# Patient Record
Sex: Female | Born: 1955 | Race: White | Hispanic: No | Smoking: Current some day smoker
Health system: Southern US, Community
[De-identification: ages and names within clinical notes are randomized; demographics above are authoritative.]

## PROBLEM LIST (undated history)

## (undated) DIAGNOSIS — K7030 Alcoholic cirrhosis of liver without ascites: Secondary | ICD-10-CM

## (undated) DIAGNOSIS — K7460 Unspecified cirrhosis of liver: Secondary | ICD-10-CM

## (undated) DIAGNOSIS — I272 Pulmonary hypertension, unspecified: Secondary | ICD-10-CM

## (undated) DIAGNOSIS — R0602 Shortness of breath: Secondary | ICD-10-CM

## (undated) DIAGNOSIS — I5189 Other ill-defined heart diseases: Secondary | ICD-10-CM

## (undated) DIAGNOSIS — R7881 Bacteremia: Secondary | ICD-10-CM

## (undated) DIAGNOSIS — I509 Heart failure, unspecified: Secondary | ICD-10-CM

## (undated) DIAGNOSIS — J189 Pneumonia, unspecified organism: Secondary | ICD-10-CM

## (undated) HISTORY — DX: Alcoholic cirrhosis of liver without ascites: K70.30

## (undated) HISTORY — PX: TONSILECTOMY, ADENOIDECTOMY, BILATERAL MYRINGOTOMY AND TUBES: SHX2538

## (undated) HISTORY — DX: Unspecified cirrhosis of liver: K74.60

## (undated) HISTORY — PX: TUBAL LIGATION: SHX77

---

## 1998-09-22 ENCOUNTER — Encounter: Payer: Self-pay | Admitting: Emergency Medicine

## 1998-09-22 ENCOUNTER — Emergency Department (HOSPITAL_COMMUNITY): Admission: EM | Admit: 1998-09-22 | Discharge: 1998-09-22 | Payer: Self-pay | Admitting: Emergency Medicine

## 2006-04-15 ENCOUNTER — Emergency Department (HOSPITAL_COMMUNITY): Admission: EM | Admit: 2006-04-15 | Discharge: 2006-04-15 | Payer: Self-pay | Admitting: Emergency Medicine

## 2007-04-12 ENCOUNTER — Emergency Department (HOSPITAL_COMMUNITY): Admission: EM | Admit: 2007-04-12 | Discharge: 2007-04-12 | Payer: Self-pay | Admitting: Emergency Medicine

## 2008-05-25 HISTORY — PX: PARACENTESIS: SHX844

## 2008-07-06 HISTORY — PX: PARACENTESIS: SHX844

## 2009-05-24 LAB — CBC AND DIFFERENTIAL: HCT: 48 % — AB (ref 36–46)

## 2009-05-24 LAB — HEPATIC FUNCTION PANEL: ALT: 30 U/L (ref 7–35)

## 2010-04-23 ENCOUNTER — Ambulatory Visit (HOSPITAL_COMMUNITY): Admission: RE | Admit: 2010-04-23 | Payer: Self-pay | Source: Home / Self Care | Admitting: Family Medicine

## 2010-05-04 ENCOUNTER — Other Ambulatory Visit (HOSPITAL_COMMUNITY): Payer: Self-pay | Admitting: *Deleted

## 2010-05-04 DIAGNOSIS — Z1239 Encounter for other screening for malignant neoplasm of breast: Secondary | ICD-10-CM

## 2010-05-28 ENCOUNTER — Ambulatory Visit (HOSPITAL_COMMUNITY)
Admission: RE | Admit: 2010-05-28 | Discharge: 2010-05-28 | Disposition: A | Discharge status: Home / Self Care | Payer: PRIVATE HEALTH INSURANCE | Source: Ambulatory Visit | Attending: *Deleted | Admitting: *Deleted

## 2010-05-28 DIAGNOSIS — Z1239 Encounter for other screening for malignant neoplasm of breast: Secondary | ICD-10-CM

## 2010-05-29 ENCOUNTER — Other Ambulatory Visit (HOSPITAL_COMMUNITY): Payer: Self-pay | Admitting: *Deleted

## 2010-05-29 DIAGNOSIS — Z139 Encounter for screening, unspecified: Secondary | ICD-10-CM

## 2010-06-04 ENCOUNTER — Ambulatory Visit (HOSPITAL_COMMUNITY): Payer: PRIVATE HEALTH INSURANCE

## 2010-11-22 ENCOUNTER — Encounter (INDEPENDENT_AMBULATORY_CARE_PROVIDER_SITE_OTHER): Payer: Self-pay

## 2010-12-03 ENCOUNTER — Telehealth (INDEPENDENT_AMBULATORY_CARE_PROVIDER_SITE_OTHER): Payer: Self-pay | Admitting: *Deleted

## 2010-12-03 DIAGNOSIS — K7030 Alcoholic cirrhosis of liver without ascites: Secondary | ICD-10-CM

## 2010-12-03 MED ORDER — POTASSIUM CHLORIDE 10 MEQ PO TBCR: 10.0000 meq | EXTENDED_RELEASE_TABLET | Freq: Every day | ORAL | Status: DC

## 2010-12-03 NOTE — Telephone Encounter (Signed)
Fax request rec'd for a refill . Last filled on 11-21-2010

## 2010-12-03 NOTE — Telephone Encounter (Signed)
Addended by: Len Blalock on: 12/03/2010 02:16 PM   Modules accepted: Orders

## 2011-01-07 ENCOUNTER — Ambulatory Visit (INDEPENDENT_AMBULATORY_CARE_PROVIDER_SITE_OTHER): Payer: PRIVATE HEALTH INSURANCE | Admitting: Internal Medicine

## 2011-02-09 ENCOUNTER — Other Ambulatory Visit (INDEPENDENT_AMBULATORY_CARE_PROVIDER_SITE_OTHER): Payer: Self-pay | Admitting: Internal Medicine

## 2011-02-09 DIAGNOSIS — K7460 Unspecified cirrhosis of liver: Secondary | ICD-10-CM

## 2011-02-11 NOTE — Telephone Encounter (Signed)
Patient needs OV.

## 2011-02-26 ENCOUNTER — Ambulatory Visit (INDEPENDENT_AMBULATORY_CARE_PROVIDER_SITE_OTHER): Payer: PRIVATE HEALTH INSURANCE | Admitting: Internal Medicine

## 2011-03-05 ENCOUNTER — Encounter (INDEPENDENT_AMBULATORY_CARE_PROVIDER_SITE_OTHER): Payer: Self-pay | Admitting: Internal Medicine

## 2011-03-05 ENCOUNTER — Ambulatory Visit (INDEPENDENT_AMBULATORY_CARE_PROVIDER_SITE_OTHER): Payer: Self-pay | Admitting: Internal Medicine

## 2011-03-05 DIAGNOSIS — K7460 Unspecified cirrhosis of liver: Secondary | ICD-10-CM

## 2011-03-05 DIAGNOSIS — K7030 Alcoholic cirrhosis of liver without ascites: Secondary | ICD-10-CM | POA: Insufficient documentation

## 2011-03-05 NOTE — Progress Notes (Signed)
Subjective:     Patient ID: Marissa Figueroa, female   DOB: Oct 28, 1955, 55 y.o.   MRN: [id number]  HPI Lachell is a 55 yr old female here today for scheduled visit for alcoholic cirrhosis. She says she is doing good.  She has gained about 44 pounds since her last visit in February of 2011. Appetite is not good. She does snack frequently.  She has not drank in 1 1/2 months.  She drank for about 3 months. She was drinking about 4-5 drinks a week. She says she is depressed and can't sleep. No lower extremitiy edema. She does tell me she has abdominal distention.   05/24/09 PT 14.2 and 1.1, Glucose 177, BUN 13, Creatinine 0.91, Calcium 9.4, Albumin 3.4, ALP 126, AST 38, Total bili 0.8, ALT 30, Sodium 137, K 3.9, Chloride 98. H and h 16.6 and 48.3 MCV 93.4 07/06/2008 Large volume abdominal paracentesis.  6.4 liters of fluid removed. 05/25/2008 large volume abdominal paracentesis: Removal of 6 liters of fluid  Review of Systems Current Outpatient Prescriptions  Medication Sig Dispense Refill  . b complex vitamins capsule Take 1 capsule by mouth daily.        . furosemide (LASIX) 20 MG tablet TAKE ONE TABLET BY MOUTH TWICE DAILY  60 tablet  0  . lactulose (CHRONULAC) 10 GM/15ML solution Take 20 g by mouth 3 (three) times daily.        . potassium chloride (KLOR-CON) 10 MEQ CR tablet Take 10 mEq by mouth daily.        [name] Kitchen spironolactone (ALDACTONE) 50 MG tablet Take 50 mg by mouth daily.        . potassium chloride (KLOR-CON 10) 10 MEQ CR tablet Take 1 tablet (10 mEq total) by mouth daily.  30 tablet  2   History   Social History  . Marital Status: Married    Spouse Name: N/A    Number of Children: N/A  . Years of Education: N/A   Occupational History  . Not on file.   Social History Main Topics  . Smoking status: Current Everyday Smoker  . Smokeless tobacco: Not on file   Comment: down to 3 cigarettes a day  . Alcohol Use: Yes     no etoh in a month in a half.  She drank mixed drinks.  .  Drug Use: No  . Sexually Active: Not on file   Other Topics Concern  . Not on file   Social History Narrative  . No narrative on file   Family Status  Relation Status Death Age  . Mother Deceased     CAD  . Father Deceased     Small cell cancer  . Sister Alive     good health  . Brother Alive     HTN, Diabetes   Past Medical History  Diagnosis Date  . Cirrhosis     ALCOHOLIC  . Alcoholic cirrhosis of liver    Past Surgical History  Procedure Date  . Tubal ligation   . Tonsilectomy, adenoidectomy, bilateral myringotomy and tubes   . Paracentesis 07/06/08  . Paracentesis 05/25/2008   Allergies no known allergies     Objective:   Physical Exam Filed Vitals:   03/05/11 1437  BP: 130/68  Pulse: 80  Temp: 98.5 F (36.9 C)  Height: 5\' 8"  (1.727 m)  Weight: 236 lb 9.6 oz (107.321 kg)    Alert and oriented. Skin warm and dry. Oral mucosa is moist. Upper dentures. She does not  wear a lower plate. Sclera anicteric, conjunctivae is pink. Thyroid not enlarged. No cervical lymphadenopathy. Lungs clear. Heart regular rate and rhythm.  Abdomen is soft. Bowel sounds are positive. No hepatomegaly,.Abdomen obese. Soft. No abdominal masses felt. No tenderness.  No edema to lower extremities. Patient is alert and oriented.     Assessment:     Alcoholic cirrhosis.  She seems to be doing fairly well. She has had a significant weight gain. Would like to rule out ascites.    Plan:    C-met, AFP, PT/INR, ammonia,  US abdomen. Advised to take medications as prescribed and not skip doses. Further recommendations once we have labs and [place] back.  OV in 6 months.

## 2011-03-05 NOTE — Patient Instructions (Signed)
Follow up in 6 month. Try to quit smoking. Follow up with mental heatlh concerning depression.

## 2011-03-11 ENCOUNTER — Other Ambulatory Visit (HOSPITAL_COMMUNITY): Payer: Self-pay

## 2011-03-12 ENCOUNTER — Ambulatory Visit (HOSPITAL_COMMUNITY)
Admission: RE | Admit: 2011-03-12 | Discharge: 2011-03-12 | Disposition: A | Discharge status: Home / Self Care | Payer: Self-pay | Source: Ambulatory Visit | Attending: Internal Medicine | Admitting: Internal Medicine

## 2011-03-12 DIAGNOSIS — K7460 Unspecified cirrhosis of liver: Secondary | ICD-10-CM | POA: Insufficient documentation

## 2011-03-12 DIAGNOSIS — K7030 Alcoholic cirrhosis of liver without ascites: Secondary | ICD-10-CM

## 2011-03-12 DIAGNOSIS — R188 Other ascites: Secondary | ICD-10-CM | POA: Insufficient documentation

## 2011-03-12 LAB — COMPREHENSIVE METABOLIC PANEL
ALT: 65 U/L — ABNORMAL HIGH (ref 0–35)
CO2: 29 mEq/L (ref 19–32)
Calcium: 9 mg/dL (ref 8.4–10.5)
Chloride: 100 mEq/L (ref 96–112)
Glucose, Bld: 120 mg/dL — ABNORMAL HIGH (ref 70–99)
Potassium: 3.5 mEq/L (ref 3.5–5.3)
Sodium: 136 mEq/L (ref 135–145)
Total Protein: 6.8 g/dL (ref 6.0–8.3)

## 2011-03-12 LAB — PROTIME-INR: Prothrombin Time: 16.4 seconds — ABNORMAL HIGH (ref 11.6–15.2)

## 2011-03-12 LAB — CBC WITH DIFFERENTIAL/PLATELET
Eosinophils Absolute: 0.2 10*3/uL (ref 0.0–0.7)
Hemoglobin: 14.6 g/dL (ref 12.0–15.0)
Lymphs Abs: 1.9 10*3/uL (ref 0.7–4.0)
MCH: 33.1 pg (ref 26.0–34.0)
MCV: 100.5 fL — ABNORMAL HIGH (ref 78.0–100.0)
Monocytes Absolute: 0.4 10*3/uL (ref 0.1–1.0)
WBC: 6.2 10*3/uL (ref 4.0–10.5)

## 2011-03-12 LAB — AMMONIA: Ammonia: 60 umol/L (ref 11–60)

## 2011-03-13 ENCOUNTER — Telehealth (INDEPENDENT_AMBULATORY_CARE_PROVIDER_SITE_OTHER): Payer: Self-pay | Admitting: *Deleted

## 2011-03-13 DIAGNOSIS — K7460 Unspecified cirrhosis of liver: Secondary | ICD-10-CM

## 2011-03-13 LAB — AFP TUMOR MARKER: AFP-Tumor Marker: 6.2 ng/mL (ref 0.0–8.0)

## 2011-03-13 NOTE — Telephone Encounter (Signed)
Per Delrae Rend the patient will need c-met in 6 months.

## 2011-04-22 ENCOUNTER — Other Ambulatory Visit (INDEPENDENT_AMBULATORY_CARE_PROVIDER_SITE_OTHER): Payer: Self-pay | Admitting: Internal Medicine

## 2011-04-23 ENCOUNTER — Telehealth (INDEPENDENT_AMBULATORY_CARE_PROVIDER_SITE_OTHER): Payer: Self-pay | Admitting: *Deleted

## 2011-04-23 NOTE — Telephone Encounter (Signed)
LM asking for Terri to please return her call. The return phone number is [phone].

## 2011-04-23 NOTE — Telephone Encounter (Signed)
Patient advised to stop the Potassium. She tells me she has not drank in several months and to tell Dr. Karilyn Cota she is sorry.

## 2011-04-23 NOTE — Telephone Encounter (Signed)
This has already been addressed

## 2011-04-23 NOTE — Telephone Encounter (Signed)
I have already spoken to patient.

## 2011-05-09 ENCOUNTER — Other Ambulatory Visit (INDEPENDENT_AMBULATORY_CARE_PROVIDER_SITE_OTHER): Payer: Self-pay | Admitting: Internal Medicine

## 2011-05-09 DIAGNOSIS — K7460 Unspecified cirrhosis of liver: Secondary | ICD-10-CM

## 2011-08-15 ENCOUNTER — Encounter (INDEPENDENT_AMBULATORY_CARE_PROVIDER_SITE_OTHER): Payer: Self-pay | Admitting: *Deleted

## 2011-08-19 ENCOUNTER — Encounter (INDEPENDENT_AMBULATORY_CARE_PROVIDER_SITE_OTHER): Payer: Self-pay | Admitting: *Deleted

## 2011-08-19 ENCOUNTER — Other Ambulatory Visit (INDEPENDENT_AMBULATORY_CARE_PROVIDER_SITE_OTHER): Payer: Self-pay | Admitting: *Deleted

## 2011-08-19 DIAGNOSIS — K7460 Unspecified cirrhosis of liver: Secondary | ICD-10-CM

## 2011-09-02 ENCOUNTER — Ambulatory Visit (INDEPENDENT_AMBULATORY_CARE_PROVIDER_SITE_OTHER): Payer: Self-pay | Admitting: Internal Medicine

## 2011-09-09 ENCOUNTER — Other Ambulatory Visit (INDEPENDENT_AMBULATORY_CARE_PROVIDER_SITE_OTHER): Payer: Self-pay | Admitting: Internal Medicine

## 2011-10-01 ENCOUNTER — Ambulatory Visit (INDEPENDENT_AMBULATORY_CARE_PROVIDER_SITE_OTHER): Payer: Self-pay | Admitting: Internal Medicine

## 2011-10-02 ENCOUNTER — Ambulatory Visit (INDEPENDENT_AMBULATORY_CARE_PROVIDER_SITE_OTHER): Payer: Self-pay | Admitting: Internal Medicine

## 2011-10-03 ENCOUNTER — Encounter (INDEPENDENT_AMBULATORY_CARE_PROVIDER_SITE_OTHER): Payer: Self-pay | Admitting: Internal Medicine

## 2011-10-03 ENCOUNTER — Ambulatory Visit (INDEPENDENT_AMBULATORY_CARE_PROVIDER_SITE_OTHER): Payer: Self-pay | Admitting: Internal Medicine

## 2011-10-03 ENCOUNTER — Telehealth (INDEPENDENT_AMBULATORY_CARE_PROVIDER_SITE_OTHER): Payer: Self-pay | Admitting: *Deleted

## 2011-10-03 VITALS — BP 118/58 | HR 80 | Temp 98.2°F | Ht 68.0 in | Wt 224.0 lb

## 2011-10-03 DIAGNOSIS — K7030 Alcoholic cirrhosis of liver without ascites: Secondary | ICD-10-CM

## 2011-10-03 NOTE — Patient Instructions (Signed)
Follow up in 6 months with a PT/INR and a Cmet,

## 2011-10-03 NOTE — Telephone Encounter (Signed)
Per Delrae Rend the patient will need to have Pt/INR , and C-Met in 6 months. The patient has an appointment 03/31/12, labs should be done 03/30/12.

## 2011-10-03 NOTE — Progress Notes (Signed)
Subjective:     Patient ID: Marissa Figueroa, female   DOB: 05/02/1955, 56 y.o.   MRN: [id number]  HPI Odell is here for a scheduled visit. She was last seen in November. She has a hx of alcoholic cirrhosis.  She tells me she has not drank anything since  November. Appetite is okay. She says she eats. Her last weight in November was 238. Today her weight is 224. She underwent an [place] in November for possible ascites and was negative for ascites. She is having a BM about one a day.     07/06/2008 Large volume abdominal paracentesis. 6.4 liters of fluid removed.  05/25/2008 large volume abdominal paracentesis: Removal of 6 liters of fluid    02/2011 US abdomen: IMPRESSION:  Normal gallbladder. Negative for gallstones or cholecystitis.  Prominent common bile duct measuring 7 mm in diameter but no  intrahepatic biliary dilatation.  Ultrasound evidence of hepatic cirrhosis and portal hypertension  with portal venous flow hepatofugal by color Doppler.  02/2011 AFP 6.2 CBC    Component Value Date/Time   WBC 6.2 03/05/2011 1453   RBC 4.41 03/05/2011 1453   HGB 14.6 03/05/2011 1453   HCT 44.3 03/05/2011 1453   PLT 80* 03/05/2011 1453   MCV 100.5* 03/05/2011 1453   MCH 33.1 03/05/2011 1453   MCHC 33.0 03/05/2011 1453   RDW 15.7* 03/05/2011 1453   LYMPHSABS 1.9 03/05/2011 1453   MONOABS 0.4 03/05/2011 1453   EOSABS 0.2 03/05/2011 1453   BASOSABS 0.0 03/05/2011 1453    CMP     Component Value Date/Time   NA 136 03/05/2011 1453   NA 137 05/24/2009   K 3.5 03/05/2011 1453   CL 100 03/05/2011 1453   CO2 29 03/05/2011 1453   GLUCOSE 120* 03/05/2011 1453   BUN 10 03/05/2011 1453   CREATININE 0.89 03/05/2011 1453   CALCIUM 9.0 03/05/2011 1453   PROT 6.8 03/05/2011 1453   ALBUMIN 2.8* 03/05/2011 1453   AST 168* 03/05/2011 1453   ALT 65* 03/05/2011 1453   ALKPHOS 144* 03/05/2011 1453   BILITOT 2.0* 03/05/2011 1453   Ammonia 60 .  Review of Systems see hpi Current Outpatient  Prescriptions  Medication Sig Dispense Refill  . b complex vitamins capsule Take 1 capsule by mouth daily.        . calcium citrate-vitamin D 200-200 MG-UNIT TABS Take 1 tablet by mouth daily.      . furosemide (LASIX) 20 MG tablet TAKE ONE TABLET BY MOUTH TWICE DAILY  1 tablet  4  . lactulose (CHRONULAC) 10 GM/15ML solution TAKE 15 ML BY MOUTH TWICE DAILY  946 mL  3  . spironolactone (ALDACTONE) 25 MG tablet TAKE TWO TABLETS BY MOUTH TWICE DAILY  120 tablet  3  . potassium chloride (KLOR-CON) 10 MEQ CR tablet Take 10 mEq by mouth daily.        [name] Kitchen spironolactone (ALDACTONE) 50 MG tablet Take 50 mg by mouth daily.        ' Past Medical History  Diagnosis Date  . Cirrhosis     ALCOHOLIC  . Alcoholic cirrhosis of liver    Past Surgical History  Procedure Date  . Tubal ligation   . Tonsilectomy, adenoidectomy, bilateral myringotomy and tubes   . Paracentesis 07/06/08  . Paracentesis 05/25/2008   History   Social History  . Marital Status: Married    Spouse Name: N/A    Number of Children: N/A  . Years of Education: N/A   Occupational  History  . Not on file.   Social History Main Topics  . Smoking status: Current Everyday Smoker  . Smokeless tobacco: Not on file   Comment: down to 3 cigarettes a day  . Alcohol Use: Yes     no etoh in a month in a half.  She drank mixed drinks.  . Drug Use: No  . Sexually Active: Not on file   Other Topics Concern  . Not on file   Social History Narrative  . No narrative on file   Family Status  Relation Status Death Age  . Mother Deceased     CAD  . Father Deceased     Small cell cancer  . Sister Alive     good health  . Brother Alive     HTN, Diabetes   Allergies no known allergies  .-     Objective:There were no vitals filed for this visit.    Physical Exam Filed Vitals:   10/03/11 1544  Height: 5\' 8"  (1.727 m)  Weight: 224 lb (101.606 kg)  Alert and oriented. Skin warm and dry. Oral mucosa is moist.   . Sclera  anicteric, conjunctivae is pink. Thyroid not enlarged. No cervical lymphadenopathy. Lungs clear. Heart regular rate and rhythm.  Abdomen is soft. Obese. Bowel sounds are positive. No hepatomegaly. No abdominal masses felt. No tenderness.  No edema to lower extremities.       Assessment:   Alcoholic cirrhosis. She has not drank in over 7 months. She has had weight gain. Ankles without edema.     Plan:    Cmet today. She did not have lab drawn before coming to office. OV in 6 month with a CMET and PT/INR.

## 2011-10-08 NOTE — Progress Notes (Signed)
Patient is aware that she needs this lab work

## 2011-10-08 NOTE — Progress Notes (Signed)
She will have this lab drawn this week

## 2011-12-05 ENCOUNTER — Encounter (INDEPENDENT_AMBULATORY_CARE_PROVIDER_SITE_OTHER): Payer: Self-pay | Admitting: Internal Medicine

## 2011-12-05 ENCOUNTER — Ambulatory Visit (INDEPENDENT_AMBULATORY_CARE_PROVIDER_SITE_OTHER): Payer: Self-pay | Admitting: Internal Medicine

## 2011-12-05 VITALS — BP 90/60 | HR 72 | Temp 98.4°F | Ht 68.0 in | Wt 214.8 lb

## 2011-12-05 DIAGNOSIS — R17 Unspecified jaundice: Secondary | ICD-10-CM

## 2011-12-05 DIAGNOSIS — K838 Other specified diseases of biliary tract: Secondary | ICD-10-CM

## 2011-12-05 NOTE — Progress Notes (Signed)
Subjective:     Patient ID: Marissa Figueroa, female   DOB: October 02, 1955, 56 y.o.   MRN: [id number]  HPI  Recently seen in the ED with vomiting. She had been vomiting  X 4 days. In the ED, she received 3 liters of fluid with potassium.  Her potassium was 2.8.   She tells me while she was sick, all she could get in her was water. Noted 11/29/2011 Total bil 5.2,  AST 139, ALT 57, ALP 108, Total protein 8.0., Albumin 3.2, Amylase 69, Lipase 39. H and H 16.8 and 48.3, MCV 102.9 Platelet ct  78. Appetite is not better.  Her daughter says her mother is better.  No abdominal distention.  No rectal bleeding.   Daelynn has a hx of alcoholic cirrhosis.  US  Abdomen: 11/29/2011  Enlarged, heterogeneous liver with nodular contours compatible with cirrhosis. Borderline dilated CBD. No visible stones, but the distal duct is difficult to visualize.  Gallbladder also appears mildly distended without wall thickening or visible stones. MRCP may be beneficial.   11/30/2011 Total bili 3.6, direct 1.0, Indirect 2.6 ALT44, ALP 95, AST 109,  K. 3.4   Review of Systems see hpi Current Outpatient Prescriptions  Medication Sig Dispense Refill  . b complex vitamins capsule Take 1 capsule by mouth daily.        . calcium citrate-vitamin D 200-200 MG-UNIT TABS Take 1 tablet by mouth daily.      . furosemide (LASIX) 20 MG tablet TAKE ONE TABLET BY MOUTH TWICE DAILY  1 tablet  4  . lactulose (CHRONULAC) 10 GM/15ML solution TAKE 15 ML BY MOUTH TWICE DAILY  946 mL  3  . potassium chloride (K-DUR) 10 MEQ tablet Take 10 mEq by mouth 2 (two) times daily.      . potassium chloride (KLOR-CON) 10 MEQ CR tablet Take 10 mEq by mouth daily.        [name] Kitchen spironolactone (ALDACTONE) 25 MG tablet TAKE TWO TABLETS BY MOUTH TWICE DAILY  120 tablet  3  . DISCONTD: spironolactone (ALDACTONE) 50 MG tablet Take 50 mg by mouth daily.         Past Medical History  Diagnosis Date  . Cirrhosis     ALCOHOLIC  . Alcoholic cirrhosis of liver     History   Social History  . Marital Status: Married    Spouse Name: N/A    Number of Children: N/A  . Years of Education: N/A   Occupational History  . Not on file.   Social History Main Topics  . Smoking status: Current Everyday Smoker  . Smokeless tobacco: Not on file   Comment: down to 3 cigarettes a day  . Alcohol Use: Yes     drank a mixed 3 weeks ago.   . Drug Use: No  . Sexually Active: Not on file   Other Topics Concern  . Not on file   Social History Narrative  . No narrative on file   Family Status  Relation Status Death Age  . Mother Deceased     CAD  . Father Deceased     Small cell cancer  . Sister Alive     good health  . Brother Alive     HTN, Diabetes   No Known Allergies Current Outpatient Prescriptions on File Prior to Visit  Medication Sig Dispense Refill  . b complex vitamins capsule Take 1 capsule by mouth daily.        . calcium citrate-vitamin D 200-200 MG-UNIT  TABS Take 1 tablet by mouth daily.      . furosemide (LASIX) 20 MG tablet TAKE ONE TABLET BY MOUTH TWICE DAILY  1 tablet  4  . lactulose (CHRONULAC) 10 GM/15ML solution TAKE 15 ML BY MOUTH TWICE DAILY  946 mL  3  . potassium chloride (KLOR-CON) 10 MEQ CR tablet Take 10 mEq by mouth daily.        [name] Kitchen spironolactone (ALDACTONE) 25 MG tablet TAKE TWO TABLETS BY MOUTH TWICE DAILY  120 tablet  3  . DISCONTD: spironolactone (ALDACTONE) 50 MG tablet Take 50 mg by mouth daily.            Objective:   Physical Exam Filed Vitals:   12/05/11 1453  Height: 5\' 8"  (1.727 m)  Weight: 214 lb 12.8 oz (97.433 kg)  Alert and oriented. Skin warm and dry. Oral mucosa is moist.   . Sclera slightly icteric, conjunctivae is pink. Thyroid not enlarged. No cervical lymphadenopathy. Lungs clear. Heart regular rate and rhythm.  Abdomen is soft. Bowel sounds are positive. No hepatomegaly. No abdominal masses felt. No tenderness.  No edema to lower extremities. Patient is alert and oriented.        Assessment:   Alcoholic cirrhosis. Elevated bilirubin with abnormal US abdomen. CBD stone needs to be ruled out.     Plan:    MRCP abdomen. No etoh.

## 2011-12-05 NOTE — Patient Instructions (Addendum)
Will schedule an MRCP abdomen. No drinking.

## 2011-12-11 ENCOUNTER — Other Ambulatory Visit (INDEPENDENT_AMBULATORY_CARE_PROVIDER_SITE_OTHER): Payer: Self-pay | Admitting: Internal Medicine

## 2011-12-11 ENCOUNTER — Other Ambulatory Visit (HOSPITAL_COMMUNITY): Payer: Self-pay

## 2011-12-12 ENCOUNTER — Other Ambulatory Visit (HOSPITAL_COMMUNITY): Payer: Self-pay

## 2011-12-13 ENCOUNTER — Other Ambulatory Visit (INDEPENDENT_AMBULATORY_CARE_PROVIDER_SITE_OTHER): Payer: Self-pay | Admitting: Internal Medicine

## 2011-12-13 ENCOUNTER — Inpatient Hospital Stay (HOSPITAL_COMMUNITY): Admission: RE | Admit: 2011-12-13 | Payer: Self-pay | Source: Ambulatory Visit

## 2011-12-13 DIAGNOSIS — K838 Other specified diseases of biliary tract: Secondary | ICD-10-CM

## 2011-12-13 DIAGNOSIS — R17 Unspecified jaundice: Secondary | ICD-10-CM

## 2011-12-18 ENCOUNTER — Ambulatory Visit (HOSPITAL_COMMUNITY)
Admission: RE | Admit: 2011-12-18 | Discharge: 2011-12-18 | Disposition: A | Discharge status: Home / Self Care | Payer: Self-pay | Source: Ambulatory Visit | Attending: Internal Medicine | Admitting: Internal Medicine

## 2011-12-18 ENCOUNTER — Telehealth (INDEPENDENT_AMBULATORY_CARE_PROVIDER_SITE_OTHER): Payer: Self-pay | Admitting: *Deleted

## 2011-12-18 DIAGNOSIS — R17 Unspecified jaundice: Secondary | ICD-10-CM

## 2011-12-18 DIAGNOSIS — K838 Other specified diseases of biliary tract: Secondary | ICD-10-CM

## 2011-12-18 MED ORDER — LORAZEPAM 0.5 MG PO TABS: 0.5000 mg | ORAL_TABLET | Freq: Once | ORAL | Status: AC

## 2011-12-18 NOTE — Telephone Encounter (Signed)
Will premedicate with Ativan 0.5mg  x 1 dose.

## 2011-12-18 NOTE — Telephone Encounter (Signed)
Patient is shc'd for MRI tomorrow and is claustrophobic and needs something called to [facility] in [place]

## 2011-12-19 ENCOUNTER — Ambulatory Visit (HOSPITAL_COMMUNITY)
Admission: RE | Admit: 2011-12-19 | Discharge: 2011-12-19 | Disposition: A | Discharge status: Home / Self Care | Payer: Self-pay | Source: Ambulatory Visit | Attending: Internal Medicine | Admitting: Internal Medicine

## 2011-12-19 DIAGNOSIS — K766 Portal hypertension: Secondary | ICD-10-CM | POA: Insufficient documentation

## 2011-12-19 DIAGNOSIS — R933 Abnormal findings on diagnostic imaging of other parts of digestive tract: Secondary | ICD-10-CM | POA: Insufficient documentation

## 2011-12-19 DIAGNOSIS — K7460 Unspecified cirrhosis of liver: Secondary | ICD-10-CM | POA: Insufficient documentation

## 2011-12-19 DIAGNOSIS — R932 Abnormal findings on diagnostic imaging of liver and biliary tract: Secondary | ICD-10-CM | POA: Insufficient documentation

## 2011-12-19 MED ORDER — GADOBENATE DIMEGLUMINE 529 MG/ML IV SOLN: 20.0000 mL | Freq: Once | INTRAVENOUS | Status: AC | PRN

## 2012-01-01 ENCOUNTER — Other Ambulatory Visit (INDEPENDENT_AMBULATORY_CARE_PROVIDER_SITE_OTHER): Payer: Self-pay | Admitting: Internal Medicine

## 2012-03-10 ENCOUNTER — Telehealth (INDEPENDENT_AMBULATORY_CARE_PROVIDER_SITE_OTHER): Payer: Self-pay | Admitting: *Deleted

## 2012-03-10 DIAGNOSIS — K7030 Alcoholic cirrhosis of liver without ascites: Secondary | ICD-10-CM

## 2012-03-10 NOTE — Telephone Encounter (Signed)
Lab order printed

## 2012-03-11 ENCOUNTER — Encounter (INDEPENDENT_AMBULATORY_CARE_PROVIDER_SITE_OTHER): Payer: Self-pay | Admitting: *Deleted

## 2012-03-26 ENCOUNTER — Emergency Department (HOSPITAL_COMMUNITY)
Admission: EM | Admit: 2012-03-26 | Discharge: 2012-03-26 | Disposition: A | Discharge status: Home / Self Care | Payer: Self-pay | Attending: Emergency Medicine | Admitting: Emergency Medicine

## 2012-03-26 ENCOUNTER — Encounter (HOSPITAL_COMMUNITY): Payer: Self-pay | Admitting: Emergency Medicine

## 2012-03-26 DIAGNOSIS — F10229 Alcohol dependence with intoxication, unspecified: Secondary | ICD-10-CM | POA: Insufficient documentation

## 2012-03-26 DIAGNOSIS — R55 Syncope and collapse: Secondary | ICD-10-CM | POA: Insufficient documentation

## 2012-03-26 DIAGNOSIS — F10929 Alcohol use, unspecified with intoxication, unspecified: Secondary | ICD-10-CM

## 2012-03-26 DIAGNOSIS — I509 Heart failure, unspecified: Secondary | ICD-10-CM | POA: Insufficient documentation

## 2012-03-26 DIAGNOSIS — Z79899 Other long term (current) drug therapy: Secondary | ICD-10-CM | POA: Insufficient documentation

## 2012-03-26 DIAGNOSIS — F17200 Nicotine dependence, unspecified, uncomplicated: Secondary | ICD-10-CM | POA: Insufficient documentation

## 2012-03-26 DIAGNOSIS — K7030 Alcoholic cirrhosis of liver without ascites: Secondary | ICD-10-CM | POA: Insufficient documentation

## 2012-03-26 HISTORY — DX: Heart failure, unspecified: I50.9

## 2012-03-26 LAB — COMPREHENSIVE METABOLIC PANEL
AST: 144 U/L — ABNORMAL HIGH (ref 0–37)
CO2: 28 mEq/L (ref 19–32)
Calcium: 9 mg/dL (ref 8.4–10.5)
Creatinine, Ser: 0.71 mg/dL (ref 0.50–1.10)
GFR calc Af Amer: >90 mL/min (ref >90)
GFR calc non Af Amer: >90 mL/min (ref >90)
Glucose, Bld: 125 mg/dL — ABNORMAL HIGH (ref 70–99)

## 2012-03-26 LAB — URINALYSIS, ROUTINE W REFLEX MICROSCOPIC
Leukocytes, UA: NEGATIVE
Urobilinogen, UA: 0.2 mg/dL (ref 0.0–1.0)

## 2012-03-26 LAB — GLUCOSE, CAPILLARY: Glucose-Capillary: 119 mg/dL — ABNORMAL HIGH (ref 70–99)

## 2012-03-26 LAB — CBC WITH DIFFERENTIAL/PLATELET
Basophils Relative: 0 % (ref 0–1)
Eosinophils Absolute: 0.3 10*3/uL (ref 0.0–0.7)
Eosinophils Relative: 3 % (ref 0–5)
Lymphs Abs: 3.7 10*3/uL (ref 0.7–4.0)
MCH: 34.1 pg — ABNORMAL HIGH (ref 26.0–34.0)
MCHC: 35.8 g/dL (ref 30.0–36.0)
MCV: 95.3 fL (ref 78.0–100.0)
Platelets: 92 10*3/uL — ABNORMAL LOW (ref 150–400)
RBC: 4.25 MIL/uL (ref 3.87–5.11)

## 2012-03-26 LAB — PROTIME-INR: INR: 1.31 (ref 0.00–1.49)

## 2012-03-26 LAB — URINE MICROSCOPIC-ADD ON

## 2012-03-26 LAB — TROPONIN I: Troponin I: <0.3 ng/mL (ref <0.30)

## 2012-03-26 LAB — RAPID URINE DRUG SCREEN, HOSP PERFORMED: Opiates: NOT DETECTED

## 2012-03-26 MED ORDER — ONDANSETRON HCL 4 MG/2ML IJ SOLN
INTRAMUSCULAR | Status: AC
  Filled 2012-03-26: qty 2

## 2012-03-26 MED ORDER — SODIUM CHLORIDE 0.9 % IV SOLN
1000.0000 mL | Freq: Once | INTRAVENOUS | Status: AC
  Administered 2012-03-26: 1000 mL via INTRAVENOUS

## 2012-03-26 MED ORDER — SODIUM CHLORIDE 0.9 % IV SOLN
1000.0000 mL | INTRAVENOUS | Status: DC
  Administered 2012-03-26: 1000 mL via INTRAVENOUS

## 2012-03-26 MED ORDER — ONDANSETRON HCL 4 MG/2ML IJ SOLN
4.0000 mg | Freq: Once | INTRAMUSCULAR | Status: AC
  Administered 2012-03-26: 4 mg via INTRAVENOUS

## 2012-03-26 NOTE — ED Provider Notes (Signed)
History     CSN: [id number]  Arrival date & time 03/26/12  0140   First MD Initiated Contact with Patient 03/26/12 0207      Chief Complaint  Patient presents with  . Loss of Consciousness    (Consider location/radiation/quality/duration/timing/severity/associated sxs/prior treatment) The history is provided by the patient, the EMS personnel and a relative.   EMS reports they were called out to the patient's house earlier in the evening for cardiac arrest.  Family reports that the patient had been drinking rum at dinner and then at some point to the evening became unresponsive and was unarousable.  By the time EMS arrived the patient was much more responsive and at that time the patient refused transport to the emergency department for evaluation.  The patient reports that she does not remember the first time EMS came to her house.  EMS was then called out a second time by the patient's daughters for cardiac arrest again and they stated that she became unresponsive and unarousable again.  On EMS arrival the patient was slightly confused but was breathing on her and again tried to refuse transport however EMS was able to the patient and her.  The patient reports a history of cirrhosis and alcohol abuse and does state that she's been drinking more often again.  She drank 2 nights ago and since then has had some nausea and mild epigastric discomfort.  She's had some vomiting.  She denies any active chest pain or shortness of breath at this time.  She does report that she's had spasms of pain in her chest is worse with movement and palpation over the past several months.  She's not seen or discuss her chest pain with her physician.  She has no history of coronary artery disease.  She does have a history of congestive heart failure.  She is not a diabetic but reports she has been thirsty.  She denies dysuria, polyuria, urinary frequency.  She denies drug abuse.  Family reports no history of mental health  instability.  The patient reports compliance with her medications including her spironolactone.  At this time the patient is without any complaints Past Medical History  Diagnosis Date  . Cirrhosis     ALCOHOLIC  . Alcoholic cirrhosis of liver   . CHF (congestive heart failure)     Past Surgical History  Procedure Date  . Tubal ligation   . Tonsilectomy, adenoidectomy, bilateral myringotomy and tubes   . Paracentesis 07/06/08  . Paracentesis 05/25/2008    No family history on file.  History  Substance Use Topics  . Smoking status: Current Every Day Smoker  . Smokeless tobacco: Not on file     Comment: down to 3 cigarettes a day  . Alcohol Use: Yes     Comment: drank tonight at dinner    OB History    Grav Para Term Preterm Abortions TAB SAB Ect Mult Living                  Review of Systems  Cardiovascular: Positive for syncope.  All other systems reviewed and are negative.    Allergies  Review of patient's allergies indicates no known allergies.  Home Medications   Current Outpatient Rx  Name  Route  Sig  Dispense  Refill  . B COMPLEX VITAMINS PO CAPS   Oral   Take 1 capsule by mouth daily.           [name] Kitchen CALCIUM CITRATE-VITAMIN D 200-200 MG-UNIT  PO TABS   Oral   Take 1 tablet by mouth daily.         . FUROSEMIDE 20 MG PO TABS      TAKE ONE TABLET BY MOUTH TWICE DAILY   1 tablet   4   . KLOR-CON M10 10 MEQ PO TBCR      TAKE ONE TABLET BY MOUTH EVERY DAY   30 each   2   . SPIRONOLACTONE 25 MG PO TABS      TAKE TWO TABLETS BY MOUTH TWICE DAILY   120 tablet   2   . LACTULOSE 10 GM/15ML PO SOLN      TAKE 15 ML BY MOUTH TWICE DAILY   946 mL   3   . POTASSIUM CHLORIDE ER 10 MEQ PO TBCR   Oral   Take 10 mEq by mouth 2 (two) times daily.         [name] Kitchen POTASSIUM CHLORIDE 10 MEQ PO TBCR   Oral   Take 10 mEq by mouth daily.             BP 134/67  Pulse 91  Temp 98.1 F (36.7 C)  Resp 18  Ht 5\' 8"  (1.727 m)  Wt 207 lb (93.895 kg)   BMI 31.47 kg/m2  SpO2 96%  Physical Exam  Nursing note and vitals reviewed. Constitutional: She is oriented to person, place, and time. She appears well-developed and well-nourished. No distress.       Smells of alcohol  HENT:  Head: Normocephalic and atraumatic.  Eyes: EOM are normal. Pupils are equal, round, and reactive to light.       Injection of bilateral conjunctiva  Neck: Normal range of motion.  Cardiovascular: Normal rate, regular rhythm and normal heart sounds.   Pulmonary/Chest: Effort normal and breath sounds normal.  Abdominal: Soft. She exhibits no distension. There is no tenderness.  Musculoskeletal: Normal range of motion.  Neurological: She is alert and oriented to person, place, and time.       5/5 strength in major muscle groups of  bilateral upper and lower extremities. Speech normal. No facial asymetry.   Skin: Skin is warm and dry.  Psychiatric: She has a normal mood and affect. Judgment normal.    ED Course  Procedures (including critical care time)  Labs Reviewed  CBC WITH DIFFERENTIAL - Abnormal; Notable for the following:    MCH 34.1 (*)     Platelets 92 (*)     All other components within normal limits  COMPREHENSIVE METABOLIC PANEL - Abnormal; Notable for the following:    Potassium 3.0 (*)     Glucose, Bld 125 (*)     Albumin 2.8 (*)     AST 144 (*)     ALT 51 (*)     Alkaline Phosphatase 142 (*)     Total Bilirubin 1.4 (*)     All other components within normal limits  ETHANOL - Abnormal; Notable for the following:    Alcohol, Ethyl (B) 306 (*)     All other components within normal limits  URINALYSIS, ROUTINE W REFLEX MICROSCOPIC - Abnormal; Notable for the following:    APPearance HAZY (*)     Hgb urine dipstick SMALL (*)     Protein, ur TRACE (*)     Nitrite POSITIVE (*)     All other components within normal limits  PROTIME-INR - Abnormal; Notable for the following:    Prothrombin Time 16.0 (*)  All other components within normal  limits  GLUCOSE, CAPILLARY - Abnormal; Notable for the following:    Glucose-Capillary 119 (*)     All other components within normal limits  LIPASE, BLOOD - Abnormal; Notable for the following:    Lipase 106 (*)     All other components within normal limits  URINE MICROSCOPIC-ADD ON - Abnormal; Notable for the following:    Squamous Epithelial / LPF FEW (*)     Bacteria, UA MANY (*)     All other components within normal limits  TROPONIN I  URINE RAPID DRUG SCREEN (HOSP PERFORMED)  URINE CULTURE   No results found.   1. Alcohol intoxication     Date: 03/26/2012  Rate: 82  Rhythm: normal sinus rhythm  QRS Axis: normal  Intervals: normal  ST/T Wave abnormalities: normal  Conduction Disutrbances: none  Narrative Interpretation:   Old EKG Reviewed:no prior ecg      MDM  The patient is intoxicated the emergency department with alcohol level of 306.  I suspect that her "unresponsive" episodes or more alcohol intoxication in the patient being passed out.  This likely explains why the patient doesn't remember talking with EMS the initial time they came to her house.  No stigmata of recent seizure including tongue biting and urinary incontinence.  I recommended that the patient stop drinking alcohol as it will worsen her cirrhosis.  She's had mild nausea and epigastric pain over the past 48 hours.  I suspect this is acute or chronic mild pancreatitis.  She's keeping oral fluids down the emergency apartment.  She's been hydrated in the emergency department.  Discharge home in good condition.        Lyanne Co, MD 03/26/12 [phone]

## 2012-03-26 NOTE — ED Notes (Signed)
Pt alert & oriented x4, stable gait. Patient  given discharge instructions, paperwork & prescription(s). Patient verbalized understanding. Pt left department w/ no further questions. 

## 2012-03-26 NOTE — ED Notes (Signed)
EMS was called earlier tonight for a cardiac arrest on the patient.  Patient was found beside car and placed on EMS stretcher.  Patient became A&O when placed on stretcher and refused transport.  EMS was paged out for cardiac arrest again on this patient.  Patient found in lateral recumbent position and breathing on her own; EMS states patient was confused on their arrival.  Patient again wanted to refuse transport; however, EMS personnel talked her into coming to be evaluated.

## 2012-03-26 NOTE — ED Notes (Signed)
Pt stating she want to go home. Pt very anxious, family trying to make pt stay.

## 2012-03-28 LAB — URINE CULTURE

## 2012-03-29 NOTE — ED Notes (Signed)
+   Urine Chart sent to EDP office for review. 

## 2012-04-02 ENCOUNTER — Ambulatory Visit (INDEPENDENT_AMBULATORY_CARE_PROVIDER_SITE_OTHER): Payer: Self-pay | Admitting: Internal Medicine

## 2012-04-04 ENCOUNTER — Telehealth (HOSPITAL_COMMUNITY): Payer: Self-pay | Admitting: Emergency Medicine

## 2012-04-04 NOTE — ED Notes (Signed)
Rx called in to [facility] in [place] by Norm Parcel PFM.

## 2012-04-04 NOTE — ED Notes (Signed)
Chart returned from EDP office. Prescribed Bactrim DS 1 tab PO daily x 7 days. Follow-up with PCP or return if worsening symptoms or fever >102. Prescribed by Roxy Horseman PA-C.

## 2012-04-29 ENCOUNTER — Ambulatory Visit (INDEPENDENT_AMBULATORY_CARE_PROVIDER_SITE_OTHER): Payer: Self-pay | Admitting: Internal Medicine

## 2012-06-04 ENCOUNTER — Other Ambulatory Visit (INDEPENDENT_AMBULATORY_CARE_PROVIDER_SITE_OTHER): Payer: Self-pay | Admitting: Internal Medicine

## 2012-06-04 MED ORDER — POTASSIUM CHLORIDE CRYS ER 10 MEQ PO TBCR: 10.0000 meq | EXTENDED_RELEASE_TABLET | Freq: Every day | ORAL | Status: DC

## 2012-06-04 NOTE — Telephone Encounter (Signed)
Patient needs OV. May have this filled but no refills.

## 2012-06-12 ENCOUNTER — Encounter (INDEPENDENT_AMBULATORY_CARE_PROVIDER_SITE_OTHER): Payer: Self-pay | Admitting: *Deleted

## 2012-06-18 ENCOUNTER — Telehealth (INDEPENDENT_AMBULATORY_CARE_PROVIDER_SITE_OTHER): Payer: Self-pay | Admitting: Internal Medicine

## 2012-06-18 DIAGNOSIS — K769 Liver disease, unspecified: Secondary | ICD-10-CM

## 2012-06-18 NOTE — Telephone Encounter (Signed)
Request for Ct abdomen with and with CM

## 2012-06-22 ENCOUNTER — Ambulatory Visit (HOSPITAL_COMMUNITY): Payer: Self-pay

## 2012-06-23 ENCOUNTER — Ambulatory Visit (HOSPITAL_COMMUNITY): Payer: Self-pay

## 2012-06-25 ENCOUNTER — Ambulatory Visit (INDEPENDENT_AMBULATORY_CARE_PROVIDER_SITE_OTHER): Payer: Self-pay | Admitting: Internal Medicine

## 2012-06-26 ENCOUNTER — Encounter (HOSPITAL_COMMUNITY): Payer: Self-pay

## 2012-06-26 ENCOUNTER — Ambulatory Visit (HOSPITAL_COMMUNITY)
Admission: RE | Admit: 2012-06-26 | Discharge: 2012-06-26 | Disposition: A | Discharge status: Home / Self Care | Payer: Self-pay | Source: Ambulatory Visit | Attending: Internal Medicine | Admitting: Internal Medicine

## 2012-06-26 DIAGNOSIS — I868 Varicose veins of other specified sites: Secondary | ICD-10-CM | POA: Insufficient documentation

## 2012-06-26 DIAGNOSIS — K7460 Unspecified cirrhosis of liver: Secondary | ICD-10-CM | POA: Insufficient documentation

## 2012-06-26 MED ORDER — IOHEXOL 300 MG/ML  SOLN
100.0000 mL | Freq: Once | INTRAMUSCULAR | Status: AC | PRN
  Administered 2012-06-26: 100 mL via INTRAVENOUS

## 2012-06-30 ENCOUNTER — Other Ambulatory Visit (INDEPENDENT_AMBULATORY_CARE_PROVIDER_SITE_OTHER): Payer: Self-pay | Admitting: *Deleted

## 2012-06-30 ENCOUNTER — Encounter (INDEPENDENT_AMBULATORY_CARE_PROVIDER_SITE_OTHER): Payer: Self-pay | Admitting: *Deleted

## 2012-06-30 ENCOUNTER — Telehealth (INDEPENDENT_AMBULATORY_CARE_PROVIDER_SITE_OTHER): Payer: Self-pay | Admitting: *Deleted

## 2012-06-30 ENCOUNTER — Ambulatory Visit (INDEPENDENT_AMBULATORY_CARE_PROVIDER_SITE_OTHER): Payer: Self-pay | Admitting: Internal Medicine

## 2012-06-30 ENCOUNTER — Encounter (INDEPENDENT_AMBULATORY_CARE_PROVIDER_SITE_OTHER): Payer: Self-pay | Admitting: Internal Medicine

## 2012-06-30 VITALS — BP 132/70 | HR 72 | Temp 98.7°F | Ht 68.0 in | Wt 226.6 lb

## 2012-06-30 DIAGNOSIS — F1010 Alcohol abuse, uncomplicated: Secondary | ICD-10-CM | POA: Insufficient documentation

## 2012-06-30 DIAGNOSIS — K709 Alcoholic liver disease, unspecified: Secondary | ICD-10-CM

## 2012-06-30 NOTE — Telephone Encounter (Signed)
reviewed

## 2012-06-30 NOTE — Progress Notes (Signed)
Subjective:     Patient ID: Marissa Figueroa, female   DOB: November 22, 1955, 57 y.o.   MRN: [id number]  HPI Here today for follow up of her alcoholic cirrhosis. She tells me she is doing good, however her home life is terrible. Her nerves are shot. She is having problems with her daughter. She has been evicted from her home. She has gained 12 pounds since her last visit.  Daughter tells me she drank 2 mixed drinks at a [employer] in [place] in December and was transferred to the ED. Daughter states she did CPR on patient. Patient was evaluated in the ED and discharged. Patient was apparently intoxicated with an etoh of 306. She tells me her abdomen is distended. Recent CT abdomen/pelvis did not reveal ascites. She is having 2 BMs a day. No melena or bright red rectal bleeding. She does tell me she vomited blood last week: bright red in color.  She also has frequent nose bleeds.  03/26/2012 Ethanol: 306 High, lipase 105 high CMP     Component Value Date/Time   NA 139 03/26/2012 0211   NA 137 05/24/2009   K 3.0* 03/26/2012 0211   CL 100 03/26/2012 0211   CO2 28 03/26/2012 0211   GLUCOSE 125* 03/26/2012 0211   BUN 7 03/26/2012 0211   CREATININE 0.71 03/26/2012 0211   CREATININE 0.89 03/05/2011 1453   CALCIUM 9.0 03/26/2012 0211   PROT 7.7 03/26/2012 0211   ALBUMIN 2.8* 03/26/2012 0211   AST 144* 03/26/2012 0211   ALT 51* 03/26/2012 0211   ALKPHOS 142* 03/26/2012 0211   BILITOT 1.4* 03/26/2012 0211   GFRNONAA >90 03/26/2012 0211   GFRAA >90 03/26/2012 0211      CBC    Component Value Date/Time   WBC 8.9 03/26/2012 0211   RBC 4.25 03/26/2012 0211   HGB 14.5 03/26/2012 0211   HCT 40.5 03/26/2012 0211   PLT 92* 03/26/2012 0211   MCV 95.3 03/26/2012 0211   MCH 34.1* 03/26/2012 0211   MCHC 35.8 03/26/2012 0211   RDW 14.0 03/26/2012 0211   LYMPHSABS 3.7 03/26/2012 0211   MONOABS 0.8 03/26/2012 0211   EOSABS 0.3 03/26/2012 0211   BASOSABS 0.0 03/26/2012 0211    Drugs of  Abuse     Component Value Date/Time   LABOPIA NONE DETECTED 03/26/2012 0308   COCAINSCRNUR NONE DETECTED 03/26/2012 0308   LABBENZ NONE DETECTED 03/26/2012 0308   AMPHETMU NONE DETECTED 03/26/2012 0308   THCU NONE DETECTED 03/26/2012 0308   LABBARB NONE DETECTED 03/26/2012 0308      Hep A IGM negative, HBsAG negative, Hep B core IGM negative, Hepatitis C AB negative.  06/18/2012 CT abdomen/pelvis with CM: hepatic lesion: 1. No CT evidence of hepatic neoplasm. Considered continued  hepatoma surveillance with MRI due to its higher sensitivity and  specificity in the setting of cirrhosis.  2. Upper abdominal varices, consistent with portal venous  hypertension.  3. No acute findings. 12/05/2011 MRI abdomen with/without constrast1. Moderate to marked cirrhosis with portal venous hypertension.  No evidence of hepatocellular carcinoma. Probable regenerative  nodule in the lateral segment left lobe. Consider 23-month follow-  up MRI or pre and post contrast dedicated CT for further  characterization.  2. Motion degradation on postcontrast images. The technologist  notes describe the patient as claustrophobic. CT may be preferred  for follow-up over MRI.  3. Probable cholelithiasis.  4. Minimal common duct dilatation for age, without evidence of  obstructive stone/mass and no evidence of intrahepatic  biliary  ductal dilatation.    07/06/2008 Large volume abdominal paracentesis. 6.4 liters of fluid removed.  05/25/2008 large volume abdominal paracentesis: Removal of 6 liters of fluid     Review of Systems see hpi Current Outpatient Prescriptions  Medication Sig Dispense Refill  . b complex vitamins capsule Take 1 capsule by mouth daily.        . calcium citrate-vitamin D 200-200 MG-UNIT TABS Take 1 tablet by mouth daily.      . furosemide (LASIX) 20 MG tablet TAKE ONE TABLET BY MOUTH TWICE DAILY  60 tablet  0  . lactulose (CHRONULAC) 10 GM/15ML solution TAKE 15 ML BY MOUTH TWICE DAILY   946 mL  3  . potassium chloride (KLOR-CON) 10 MEQ CR tablet Take 10 mEq by mouth daily.        [name] Kitchen spironolactone (ALDACTONE) 25 MG tablet TAKE TWO TABLETS BY MOUTH TWICE DAILY  120 tablet  2   No current facility-administered medications for this visit.   Past Medical History  Diagnosis Date  . Cirrhosis     ALCOHOLIC  . Alcoholic cirrhosis of liver   . CHF (congestive heart failure)    Past Surgical History  Procedure Laterality Date  . Tubal ligation    . Tonsilectomy, adenoidectomy, bilateral myringotomy and tubes    . Paracentesis  07/06/08  . Paracentesis  05/25/2008   No Known Allergies      Objective:   Physical Exam  Filed Vitals:   06/30/12 1457  BP: 132/70  Pulse: 72  Temp: 98.7 F (37.1 C)  Height: 5\' 8"  (1.727 m)  Weight: 226 lb 9.6 oz (102.785 kg)   Alert and oriented. Skin warm and dry. Oral mucosa is moist.   . Sclera anicteric, conjunctivae is pink. Thyroid not enlarged. No cervical lymphadenopathy. Lungs clear. Heart regular rate and rhythm.  Abdomen is soft. Bowel sounds are positive. No hepatomegaly. No abdominal masses felt. No tenderness. Abdomen is obese  No edema to lower extremities.        Assessment:   Alcoholic cirrhosis. In December her ethanol was greater than 300. She has decompensated alcoholic disease and must quit drinking.     Plan:    Will need an MRI abdomen in one year to follow liver lesion. Patient needs to quit drinking and was advised.   OV 3 months with Dr. Karilyn Cota. CBC, PT/INR, AFP. EGD with possible banding.

## 2012-06-30 NOTE — Telephone Encounter (Signed)
Patient came back to the office to get a copy of her 100% discount for the lab. Marissa Figueroa stated "don't tell  Terri but I am not going back to the lab today to have my blood drawn. I will go tomorrow".

## 2012-06-30 NOTE — Patient Instructions (Addendum)
Labs today and OV in 3 months with Dr. Karilyn Cota.

## 2012-07-01 LAB — COMPREHENSIVE METABOLIC PANEL
AST: 71 U/L — ABNORMAL HIGH (ref 0–37)
Albumin: 2.8 g/dL — ABNORMAL LOW (ref 3.5–5.2)
Alkaline Phosphatase: 136 U/L — ABNORMAL HIGH (ref 39–117)
BUN: 7 mg/dL (ref 6–23)
Potassium: 3.8 mEq/L (ref 3.5–5.3)
Sodium: 138 mEq/L (ref 135–145)

## 2012-07-02 ENCOUNTER — Encounter (HOSPITAL_COMMUNITY): Payer: Self-pay | Admitting: Pharmacy Technician

## 2012-07-09 ENCOUNTER — Encounter (INDEPENDENT_AMBULATORY_CARE_PROVIDER_SITE_OTHER): Payer: Self-pay

## 2012-07-10 ENCOUNTER — Encounter (HOSPITAL_COMMUNITY): Admission: RE | Payer: Self-pay | Source: Ambulatory Visit

## 2012-07-10 ENCOUNTER — Ambulatory Visit (HOSPITAL_COMMUNITY): Admission: RE | Admit: 2012-07-10 | Payer: Self-pay | Source: Ambulatory Visit | Admitting: Internal Medicine

## 2012-07-10 ENCOUNTER — Other Ambulatory Visit (INDEPENDENT_AMBULATORY_CARE_PROVIDER_SITE_OTHER): Payer: Self-pay | Admitting: Internal Medicine

## 2012-07-10 LAB — CBC WITH DIFFERENTIAL/PLATELET
Basophils Absolute: 0 10*3/uL (ref 0.0–0.1)
Eosinophils Relative: 3 % (ref 0–5)
HCT: 43.6 % (ref 36.0–46.0)
Hemoglobin: 15.4 g/dL — ABNORMAL HIGH (ref 12.0–15.0)
Lymphocytes Relative: 34 % (ref 12–46)
Lymphs Abs: 2.4 10*3/uL (ref 0.7–4.0)
MCV: 96.2 fL (ref 78.0–100.0)
Monocytes Absolute: 0.5 10*3/uL (ref 0.1–1.0)
Monocytes Relative: 7 % (ref 3–12)
RDW: 14.7 % (ref 11.5–15.5)
WBC: 7.1 10*3/uL (ref 4.0–10.5)

## 2012-07-10 SURGERY — EGD (ESOPHAGOGASTRODUODENOSCOPY)
Anesthesia: Moderate Sedation

## 2012-07-11 LAB — ETHANOL: Alcohol, Ethyl (B): <10 mg/dL (ref 0–10)

## 2012-07-11 LAB — COMPREHENSIVE METABOLIC PANEL
Albumin: 2.5 g/dL — ABNORMAL LOW (ref 3.5–5.2)
Alkaline Phosphatase: 123 U/L — ABNORMAL HIGH (ref 39–117)
BUN: 7 mg/dL (ref 6–23)
CO2: 26 mEq/L (ref 19–32)
Glucose, Bld: 155 mg/dL — ABNORMAL HIGH (ref 70–99)
Sodium: 135 mEq/L (ref 135–145)
Total Bilirubin: 2.5 mg/dL — ABNORMAL HIGH (ref 0.3–1.2)
Total Protein: 6.5 g/dL (ref 6.0–8.3)

## 2012-07-19 ENCOUNTER — Other Ambulatory Visit (INDEPENDENT_AMBULATORY_CARE_PROVIDER_SITE_OTHER): Payer: Self-pay | Admitting: Internal Medicine

## 2012-07-19 NOTE — Telephone Encounter (Signed)
She does not need KCL when K is normal and she is on spironolactone.

## 2012-07-22 ENCOUNTER — Other Ambulatory Visit (INDEPENDENT_AMBULATORY_CARE_PROVIDER_SITE_OTHER): Payer: Self-pay | Admitting: Internal Medicine

## 2012-07-23 ENCOUNTER — Telehealth (INDEPENDENT_AMBULATORY_CARE_PROVIDER_SITE_OTHER): Payer: Self-pay | Admitting: *Deleted

## 2012-07-23 NOTE — Telephone Encounter (Signed)
Please call patient this afternoon with results of her labs you ordered, she states she hasn't heard anything -- her # is [phone]

## 2012-07-23 NOTE — Telephone Encounter (Signed)
Results given to patient. I had previously given her these results

## 2012-08-27 ENCOUNTER — Ambulatory Visit (INDEPENDENT_AMBULATORY_CARE_PROVIDER_SITE_OTHER): Payer: Self-pay | Admitting: Internal Medicine

## 2012-09-02 ENCOUNTER — Ambulatory Visit (INDEPENDENT_AMBULATORY_CARE_PROVIDER_SITE_OTHER): Payer: Self-pay | Admitting: Internal Medicine

## 2012-09-02 ENCOUNTER — Other Ambulatory Visit (INDEPENDENT_AMBULATORY_CARE_PROVIDER_SITE_OTHER): Payer: Self-pay | Admitting: Internal Medicine

## 2012-09-02 ENCOUNTER — Encounter (INDEPENDENT_AMBULATORY_CARE_PROVIDER_SITE_OTHER): Payer: Self-pay | Admitting: Internal Medicine

## 2012-09-02 VITALS — BP 108/62 | HR 64 | Ht 68.0 in | Wt 233.0 lb

## 2012-09-02 DIAGNOSIS — K7030 Alcoholic cirrhosis of liver without ascites: Secondary | ICD-10-CM

## 2012-09-02 DIAGNOSIS — K7460 Unspecified cirrhosis of liver: Secondary | ICD-10-CM

## 2012-09-02 DIAGNOSIS — R609 Edema, unspecified: Secondary | ICD-10-CM | POA: Insufficient documentation

## 2012-09-02 MED ORDER — POTASSIUM CHLORIDE CRYS ER 10 MEQ PO TBCR: EXTENDED_RELEASE_TABLET | ORAL | Status: DC

## 2012-09-02 MED ORDER — SPIRONOLACTONE 25 MG PO TABS: 50.0000 mg | ORAL_TABLET | Freq: Two times a day (BID) | ORAL | Status: DC

## 2012-09-02 MED ORDER — FUROSEMIDE 20 MG PO TABS: 20.0000 mg | ORAL_TABLET | Freq: Every day | ORAL | Status: DC

## 2012-09-02 NOTE — Progress Notes (Signed)
Subjective:    Patient ID: Marissa Figueroa, female    DOB: [birth year], 56 y.o.   MRN: [id number]  HPI Presents today with c/o leg edema. Started about a month ago.  She started taking [facility] root and the swelling to legs resolved. She does have edema to her lower extremities today. She is trying to stay off her feel as much as she can. Appetite is good. She has gained 7 pounds since March.  She also c/o left hip pain. Hurts to walk or to sit down. She has BM daily x 2-3 a day. NO melena or bright red rectal bleeding.  She is not drinking. She has not drank in 2-3 months. She is smoking marijuana occasionally.   Hep A IGM negative, HBsAG negative, Hep B core IGM negative, Hepatitis C AB negative.  CBC    Component Value Date/Time   WBC 7.1 07/10/2012 1509   RBC 4.53 07/10/2012 1509   HGB 15.4* 07/10/2012 1509   HCT 43.6 07/10/2012 1509   PLT 102* 07/10/2012 1509   MCV 96.2 07/10/2012 1509   MCH 34.0 07/10/2012 1509   MCHC 35.3 07/10/2012 1509   RDW 14.7 07/10/2012 1509   LYMPHSABS 2.4 07/10/2012 1509   MONOABS 0.5 07/10/2012 1509   EOSABS 0.2 07/10/2012 1509   BASOSABS 0.0 07/10/2012 1509   CMP     Component Value Date/Time   NA 135 07/10/2012 0440   NA 137 05/24/2009   K 3.7 07/10/2012 0440   CL 101 07/10/2012 0440   CO2 26 07/10/2012 0440   GLUCOSE 155* 07/10/2012 0440   BUN 7 07/10/2012 0440   CREATININE 0.87 07/10/2012 0440   CREATININE 0.71 03/26/2012 0211   CALCIUM 8.6 07/10/2012 0440   PROT 6.5 07/10/2012 0440   ALBUMIN 2.5* 07/10/2012 0440   AST 82* 07/10/2012 0440   ALT 34 07/10/2012 0440   ALKPHOS 123* 07/10/2012 0440   BILITOT 2.5* 07/10/2012 0440   GFRNONAA >90 03/26/2012 0211   GFRAA >90 03/26/2012 0211       Review of Systems see hpi Current Outpatient Prescriptions  Medication Sig Dispense Refill  . b complex vitamins tablet Take 1 tablet by mouth daily.      . calcium-vitamin D (OSCAL WITH D) 500-200 MG-UNIT per tablet Take 1 tablet by mouth 2 (two) times daily.       . furosemide (LASIX) 20 MG tablet Take 20 mg by mouth daily.      [name] Kitchen KLOR-CON M10 10 MEQ tablet TAKE ONE TABLET BY MOUTH ONCE DAILY. PATIENT NEEDS OFFICE VISIT BEFORE DOCTOR WILL REFILL AGAIN.  30 tablet  0  . lactulose (CHRONULAC) 10 GM/15ML solution Take 10 g by mouth 3 (three) times daily.      [name] Kitchen spironolactone (ALDACTONE) 25 MG tablet Take 50 mg by mouth 2 (two) times daily.       No current facility-administered medications for this visit.   Past Medical History  Diagnosis Date  . Cirrhosis     ALCOHOLIC  . Alcoholic cirrhosis of liver   . CHF (congestive heart failure)    Past Surgical History  Procedure Laterality Date  . Tubal ligation    . Tonsilectomy, adenoidectomy, bilateral myringotomy and tubes    . Paracentesis  07/06/08  . Paracentesis  05/25/2008   No Known Allergies      Objective:   Physical Exam  Filed Vitals:   09/02/12 0957  BP: 108/62  Pulse: 64  Height: 5\' 8"  (1.727 m)  Weight: 233  lb (105.688 kg)   Alert and oriented. Skin warm and dry. Oral mucosa is moist.   . Sclera anicteric, conjunctivae is pink. Thyroid not enlarged. No cervical lymphadenopathy. Lungs clear. Heart regular rate and rhythm.  Abdomen is soft. Bowel sounds are positive. No hepatomegaly. No abdominal masses felt. No tenderness.  1+ edema to lower extremities.       Assessment & Plan:  Edema to lower extremities (1+). Abdomen soft.  End stage alcoholic liver disease. Refill on Lasix and Spironolactone  Cmet today. OV in July

## 2012-09-02 NOTE — Patient Instructions (Addendum)
Cmet and INR today. OV in July

## 2012-09-25 LAB — COMPREHENSIVE METABOLIC PANEL
ALT: 33 U/L (ref 0–35)
Albumin: 2.7 g/dL — ABNORMAL LOW (ref 3.5–5.2)
Alkaline Phosphatase: 121 U/L — ABNORMAL HIGH (ref 39–117)
CO2: 33 mEq/L — ABNORMAL HIGH (ref 19–32)
Glucose, Bld: 246 mg/dL — ABNORMAL HIGH (ref 70–99)
Potassium: 3.3 mEq/L — ABNORMAL LOW (ref 3.5–5.3)
Sodium: 136 mEq/L (ref 135–145)
Total Bilirubin: 2.9 mg/dL — ABNORMAL HIGH (ref 0.3–1.2)
Total Protein: 6.1 g/dL (ref 6.0–8.3)

## 2012-09-28 ENCOUNTER — Telehealth (INDEPENDENT_AMBULATORY_CARE_PROVIDER_SITE_OTHER): Payer: Self-pay | Admitting: *Deleted

## 2012-09-28 NOTE — Telephone Encounter (Signed)
This has been addressed. Results have been given to patient.

## 2012-09-28 NOTE — Telephone Encounter (Signed)
Would like to get her lab results and may leave a message if not home. She has lumps on her legs that can be mashed in then raised back up. Has not taken her medications in three weeks because she can not afford them and no bowel movement in 4 days. The return phone number is [phone].

## 2012-09-28 NOTE — Telephone Encounter (Signed)
Results given to patient. Patient says she has been off her medication for 3 weeks.

## 2012-09-29 ENCOUNTER — Ambulatory Visit (INDEPENDENT_AMBULATORY_CARE_PROVIDER_SITE_OTHER): Payer: Self-pay | Admitting: Internal Medicine

## 2012-10-20 ENCOUNTER — Encounter (HOSPITAL_COMMUNITY): Payer: Self-pay | Admitting: Emergency Medicine

## 2012-10-20 ENCOUNTER — Emergency Department (HOSPITAL_COMMUNITY)
Admission: EM | Admit: 2012-10-20 | Discharge: 2012-10-21 | Disposition: A | Discharge status: Home / Self Care | Payer: Medicaid Other | Attending: Emergency Medicine | Admitting: Emergency Medicine

## 2012-10-20 DIAGNOSIS — Z79899 Other long term (current) drug therapy: Secondary | ICD-10-CM | POA: Insufficient documentation

## 2012-10-20 DIAGNOSIS — R609 Edema, unspecified: Secondary | ICD-10-CM | POA: Insufficient documentation

## 2012-10-20 DIAGNOSIS — R630 Anorexia: Secondary | ICD-10-CM | POA: Insufficient documentation

## 2012-10-20 DIAGNOSIS — K7460 Unspecified cirrhosis of liver: Secondary | ICD-10-CM

## 2012-10-20 DIAGNOSIS — E860 Dehydration: Secondary | ICD-10-CM | POA: Insufficient documentation

## 2012-10-20 DIAGNOSIS — Z9119 Patient's noncompliance with other medical treatment and regimen: Secondary | ICD-10-CM | POA: Insufficient documentation

## 2012-10-20 DIAGNOSIS — Z91199 Patient's noncompliance with other medical treatment and regimen due to unspecified reason: Secondary | ICD-10-CM | POA: Insufficient documentation

## 2012-10-20 DIAGNOSIS — E876 Hypokalemia: Secondary | ICD-10-CM | POA: Insufficient documentation

## 2012-10-20 DIAGNOSIS — Z9851 Tubal ligation status: Secondary | ICD-10-CM | POA: Insufficient documentation

## 2012-10-20 DIAGNOSIS — F1020 Alcohol dependence, uncomplicated: Secondary | ICD-10-CM | POA: Insufficient documentation

## 2012-10-20 DIAGNOSIS — F17200 Nicotine dependence, unspecified, uncomplicated: Secondary | ICD-10-CM | POA: Insufficient documentation

## 2012-10-20 DIAGNOSIS — K7030 Alcoholic cirrhosis of liver without ascites: Secondary | ICD-10-CM | POA: Insufficient documentation

## 2012-10-20 DIAGNOSIS — R1011 Right upper quadrant pain: Secondary | ICD-10-CM | POA: Insufficient documentation

## 2012-10-20 DIAGNOSIS — R52 Pain, unspecified: Secondary | ICD-10-CM | POA: Insufficient documentation

## 2012-10-20 DIAGNOSIS — I509 Heart failure, unspecified: Secondary | ICD-10-CM | POA: Insufficient documentation

## 2012-10-20 DIAGNOSIS — R112 Nausea with vomiting, unspecified: Secondary | ICD-10-CM | POA: Insufficient documentation

## 2012-10-20 MED ORDER — SODIUM CHLORIDE 0.9 % IV SOLN
INTRAVENOUS | Status: DC
  Administered 2012-10-20: via INTRAVENOUS

## 2012-10-20 MED ORDER — ONDANSETRON HCL 4 MG/2ML IJ SOLN
4.0000 mg | Freq: Once | INTRAMUSCULAR | Status: AC
  Administered 2012-10-20: 4 mg via INTRAVENOUS
  Filled 2012-10-20: qty 2

## 2012-10-20 NOTE — ED Notes (Signed)
MD at bedside. 

## 2012-10-20 NOTE — ED Provider Notes (Signed)
History  This chart was scribed for Ward Givens, MD, by Candelaria Stagers, ED Scribe. This patient was seen in room APA06/AP[facility]06 and the patient's care was started at 11:25 PM  CSN: [id number] Arrival date & time 10/20/12  2153  First MD Initiated Contact with Patient 10/20/12 2303     Chief Complaint  Patient presents with  . Nausea  . Emesis  . Weakness   The history is provided by the patient. No language interpreter was used.   HPI Comments: Marissa Figueroa is a 57 y.o. female who presents to the Emergency Department complaining of nausea, vomiting, and generalized weakness that started four days ago.  Pt reports drinking heavily five days ago drinking half of a fifth of alcohol.  Pt has h/o cirrhosis due to excessive drinking that was dx five years ago.  Pt reports she was sober for about three years and began drinking again about one year ago.  She reports the last time she drank before this was about 2 weeks ago Her family reports she has been experiencing confusion today. They state she was standing in the middle of the floor waiting for her son to come home from work however he works night shift and was not expected home for many hours.  She is also experiencing bilateral leg swelling and abdominal swelling and pain.  She describes the abdominal pain as a burning pain.  Pt denies hematemesis, diarrhea.  Pt has h/o CHF.  Her daughter reports she was advised to get an EDG and liver biopsy, but she has not done so.she relates she has difficulty swallowing. She has an appointment to see her gastroenterologist on the 10th. Pt is noncompliant with her medications, the last time she took her lactulose was over a week ago and she doesn't take it on a regular basis.   Pt smokes.    PCP none Gastroenterologist Dr. Karilyn Cota   Past Medical History  Diagnosis Date  . Cirrhosis     ALCOHOLIC  . Alcoholic cirrhosis of liver   . CHF (congestive heart failure)    Past Surgical History  Procedure  Laterality Date  . Tubal ligation    . Tonsilectomy, adenoidectomy, bilateral myringotomy and tubes    . Paracentesis  07/06/08  . Paracentesis  05/25/2008   History reviewed. No pertinent family history. History  Substance Use Topics  . Smoking status: Current Every Day Smoker  . Smokeless tobacco: Not on file     Comment: down to 4cigarettes a week  . Alcohol Use: Yes     Comment: 1 wine cooler a couple of weeks  lives alone but is moving in with her daughter    OB History   Grav Para Term Preterm Abortions TAB SAB Ect Mult Living                 Review of Systems  Constitutional: Positive for appetite change (decreased appetite).  Gastrointestinal: Positive for nausea, vomiting and abdominal pain.  Musculoskeletal: Positive for arthralgias.  Neurological: Positive for weakness.  All other systems reviewed and are negative.    Allergies  Ultram  Home Medications   Current Outpatient Rx  Name  Route  Sig  Dispense  Refill  . B-Complex TABS   Oral   Take 1 tablet by mouth daily.         . Calcium Carbonate-Vitamin D (CALCIUM 600+D) 600-400 MG-UNIT per tablet   Oral   Take 1 tablet by mouth 2 (two) times daily.         [name] Kitchen  furosemide (LASIX) 20 MG tablet   Oral   Take 1 tablet (20 mg total) by mouth daily.   30 tablet   4   . lactulose (CHRONULAC) 10 GM/15ML solution   Oral   Take 10 g by mouth 2 (two) times daily.          . potassium chloride (K-DUR) 10 MEQ tablet   Oral   Take 10 mEq by mouth daily.         [name] Kitchen spironolactone (ALDACTONE) 25 MG tablet   Oral   Take 2 tablets (50 mg total) by mouth 2 (two) times daily.   60 tablet   4    BP 143/97  Pulse 91  Temp(Src) 99.1 F (37.3 C) (Oral)  Resp 20  Ht 5\' 8"  (1.727 m)  Wt 215 lb (97.523 kg)  BMI 32.7 kg/m2  SpO2 95%  Vital signs normal    Physical Exam  Nursing note and vitals reviewed. Constitutional: She is oriented to person, place, and time. She appears well-developed and  well-nourished.  Non-toxic appearance. She does not appear ill. No distress.  HENT:  Head: Normocephalic and atraumatic.  Right Ear: External ear normal.  Left Ear: External ear normal.  Nose: Nose normal. No mucosal edema or rhinorrhea.  Mouth/Throat: Mucous membranes are normal. No dental abscesses or edematous.  Dry tongue  Eyes: Conjunctivae and EOM are normal. Pupils are equal, round, and reactive to light.  Neck: Normal range of motion and full passive range of motion without pain. Neck supple.  Cardiovascular: Normal rate, regular rhythm and normal heart sounds.  Exam reveals no gallop and no friction rub.   No murmur heard. Pulmonary/Chest: Effort normal and breath sounds normal. No respiratory distress. She has no wheezes. She has no rhonchi. She has no rales. She exhibits no tenderness and no crepitus.  Abdominal: Soft. Normal appearance and bowel sounds are normal. She exhibits no distension. There is tenderness in the right upper quadrant. There is no rebound and no guarding.  Musculoskeletal: Normal range of motion. She exhibits no tenderness. Edema: trace pitting edema of lower bilateral legs.  Moves all extremities well.   Neurological: She is alert and oriented to person, place, and time. She has normal strength. No cranial nerve deficit.  Skin: Skin is warm, dry and intact. No rash noted. No erythema. No pallor.  Psychiatric: Her speech is normal and behavior is normal. Her mood appears not anxious.  Flat affect    ED Course  Procedures   Medications  0.9 %  sodium chloride infusion ( Intravenous Stopped 10/21/12 0238)  ondansetron (ZOFRAN) injection 4 mg (4 mg Intravenous Given 10/20/12 2343)  sodium chloride 0.9 % bolus 500 mL (0 mLs Intravenous Stopped 10/21/12 0045)  sodium chloride 0.9 % bolus 1,000 mL (0 mLs Intravenous Stopped 10/21/12 0119)  fentaNYL (SUBLIMAZE) injection 50 mcg (50 mcg Intravenous Given 10/21/12 0043)  potassium chloride SA (K-DUR,KLOR-CON) CR tablet 40  mEq (40 mEq Oral Given 10/21/12 0041)     DIAGNOSTIC STUDIES: Oxygen Saturation is 95% on room air, normal by my interpretation.    COORDINATION OF CARE:  11:32 PM Discussed course of care with pt which includes basic blood work, antinausea medication, and fluids.  Pt understands and agrees.   Patient given IV fluids and felt better. Her family state she seems improved. Patient has an appointment to see her gastroenterologist on the 10th. It was felt at this point she could be discharged. She most likely had dehydration from  the nausea and vomiting from drinking alcohol. Her pain is in the right upper quadrant consistent with her history of cirrhosis and recent alcohol intake. At this point she does not have any evidence for hepatic encephalopathy.  Results for orders placed during the hospital encounter of 10/20/12  CBC WITH DIFFERENTIAL      Result Value Range   WBC 6.3  4.0 - 10.5 K/uL   RBC 4.38  3.87 - 5.11 MIL/uL   Hemoglobin 15.1 (*) 12.0 - 15.0 g/dL   HCT 09.8  11.9 - 14.7 %   MCV 95.4  78.0 - 100.0 fL   MCH 34.5 (*) 26.0 - 34.0 pg   MCHC 36.1 (*) 30.0 - 36.0 g/dL   RDW 82.9  56.2 - 13.0 %   Platelets 66 (*) 150 - 400 K/uL   Neutrophils Relative % 60  43 - 77 %   Lymphocytes Relative 32  12 - 46 %   Monocytes Relative 6  3 - 12 %   Eosinophils Relative 2  0 - 5 %   Basophils Relative 0  0 - 1 %   Neutro Abs 3.8  1.7 - 7.7 K/uL   Lymphs Abs 2.0  0.7 - 4.0 K/uL   Monocytes Absolute 0.4  0.1 - 1.0 K/uL   Eosinophils Absolute 0.1  0.0 - 0.7 K/uL   Basophils Absolute 0.0  0.0 - 0.1 K/uL   WBC Morphology TOXIC GRANULATION     Smear Review PLATELET COUNT CONFIRMED BY SMEAR    COMPREHENSIVE METABOLIC PANEL      Result Value Range   Sodium 133 (*) 135 - 145 mEq/L   Potassium 3.1 (*) 3.5 - 5.1 mEq/L   Chloride 94 (*) 96 - 112 mEq/L   CO2 30  19 - 32 mEq/L   Glucose, Bld 102 (*) 70 - 99 mg/dL   BUN 8  6 - 23 mg/dL   Creatinine, Ser 8.65  0.50 - 1.10 mg/dL   Calcium 9.1  8.4 -  78.4 mg/dL   Total Protein 7.3  6.0 - 8.3 g/dL   Albumin 2.7 (*) 3.5 - 5.2 g/dL   AST 78 (*) 0 - 37 U/L   ALT 33  0 - 35 U/L   Alkaline Phosphatase 140 (*) 39 - 117 U/L   Total Bilirubin 3.7 (*) 0.3 - 1.2 mg/dL   GFR calc non Af Amer >90  >90 mL/min   GFR calc Af Amer >90  >90 mL/min  AMMONIA      Result Value Range   Ammonia 48  11 - 60 umol/L  APTT      Result Value Range   aPTT 36  24 - 37 seconds  PROTIME-INR      Result Value Range   Prothrombin Time 17.7 (*) 11.6 - 15.2 seconds   INR 1.50 (*) 0.00 - 1.49  URINALYSIS, ROUTINE W REFLEX MICROSCOPIC      Result Value Range   Color, Urine ORANGE (*) YELLOW   APPearance HAZY (*) CLEAR   Specific Gravity, Urine 1.010  1.005 - 1.030   pH 7.0  5.0 - 8.0   Glucose, UA NEGATIVE  NEGATIVE mg/dL   Hgb urine dipstick MODERATE (*) NEGATIVE   Bilirubin Urine MODERATE (*) NEGATIVE   Ketones, ur TRACE (*) NEGATIVE mg/dL   Protein, ur TRACE (*) NEGATIVE mg/dL   Urobilinogen, UA 1.0  0.0 - 1.0 mg/dL   Nitrite NEGATIVE  NEGATIVE   Leukocytes, UA NEGATIVE  NEGATIVE  ETHANOL      Result Value Range   Alcohol, Ethyl (B) <11  0 - 11 mg/dL  URINE MICROSCOPIC-ADD ON      Result Value Range   Squamous Epithelial / LPF FEW (*) RARE   WBC, UA 0-2  <3 WBC/hpf   Bacteria, UA RARE  RARE   Laboratory interpretation all normal except hypokalemia, mild elevation of LFTs, normal ammonia level, thrombocytopenia consistent with alcohol abuse, abnormal urinary dipstick consistent with hyperbilirubinemia    1. Nausea and vomiting   2. Dehydration   3. Cirrhosis   4. Alcoholism   5. Hypokalemia     Discharge Medication List as of 10/21/2012  2:21 AM    START taking these medications   Details  ondansetron (ZOFRAN ODT) 8 MG disintegrating tablet Take 1 tablet (8 mg total) by mouth every 8 (eight) hours as needed for nausea., Starting 10/21/2012, Until Discontinued, Print    potassium chloride SA (K-DUR,KLOR-CON) 20 MEQ tablet Take 1 tablet (20 mEq  total) by mouth 2 (two) times daily., Starting 10/21/2012, Until Discontinued, Print        Plan discharge   Devoria Albe, MD, FACEP    MDM   I personally performed the services described in this documentation, which was scribed in my presence. The recorded information has been reviewed and considered.  Devoria Albe, MD, FACEP    Ward Givens, MD 10/21/12 [phone]

## 2012-10-20 NOTE — ED Notes (Signed)
Per pt: has hx of cirrhosis. Drank heavily Friday night, Saturday began experiencing N/V, C/o generalized weakness, decreased appetite.

## 2012-10-21 LAB — URINALYSIS, ROUTINE W REFLEX MICROSCOPIC
Leukocytes, UA: NEGATIVE
Nitrite: NEGATIVE
Specific Gravity, Urine: 1.01 (ref 1.005–1.030)
Urobilinogen, UA: 1 mg/dL (ref 0.0–1.0)
pH: 7 (ref 5.0–8.0)

## 2012-10-21 LAB — CBC WITH DIFFERENTIAL/PLATELET
Basophils Absolute: 0 10*3/uL (ref 0.0–0.1)
Eosinophils Absolute: 0.1 10*3/uL (ref 0.0–0.7)
HCT: 41.8 % (ref 36.0–46.0)
Lymphocytes Relative: 32 % (ref 12–46)
MCHC: 36.1 g/dL — ABNORMAL HIGH (ref 30.0–36.0)
Monocytes Relative: 6 % (ref 3–12)
Neutrophils Relative %: 60 % (ref 43–77)
Platelets: 66 10*3/uL — ABNORMAL LOW (ref 150–400)
RDW: 14.5 % (ref 11.5–15.5)
WBC: 6.3 10*3/uL (ref 4.0–10.5)

## 2012-10-21 LAB — COMPREHENSIVE METABOLIC PANEL
ALT: 33 U/L (ref 0–35)
Albumin: 2.7 g/dL — ABNORMAL LOW (ref 3.5–5.2)
Alkaline Phosphatase: 140 U/L — ABNORMAL HIGH (ref 39–117)
Calcium: 9.1 mg/dL (ref 8.4–10.5)
GFR calc Af Amer: >90 mL/min (ref >90)
Glucose, Bld: 102 mg/dL — ABNORMAL HIGH (ref 70–99)
Potassium: 3.1 mEq/L — ABNORMAL LOW (ref 3.5–5.1)
Sodium: 133 mEq/L — ABNORMAL LOW (ref 135–145)
Total Protein: 7.3 g/dL (ref 6.0–8.3)

## 2012-10-21 LAB — PROTIME-INR
INR: 1.5 — ABNORMAL HIGH (ref 0.00–1.49)
Prothrombin Time: 17.7 seconds — ABNORMAL HIGH (ref 11.6–15.2)

## 2012-10-21 LAB — AMMONIA: Ammonia: 48 umol/L (ref 11–60)

## 2012-10-21 MED ORDER — POTASSIUM CHLORIDE CRYS ER 20 MEQ PO TBCR
40.0000 meq | EXTENDED_RELEASE_TABLET | Freq: Once | ORAL | Status: AC
  Administered 2012-10-21: 40 meq via ORAL
  Filled 2012-10-21: qty 2

## 2012-10-21 MED ORDER — SODIUM CHLORIDE 0.9 % IV BOLUS (SEPSIS)
500.0000 mL | Freq: Once | INTRAVENOUS | Status: AC
  Administered 2012-10-21: 500 mL via INTRAVENOUS

## 2012-10-21 MED ORDER — SODIUM CHLORIDE 0.9 % IV BOLUS (SEPSIS)
1000.0000 mL | Freq: Once | INTRAVENOUS | Status: AC
  Administered 2012-10-21: 1000 mL via INTRAVENOUS

## 2012-10-21 MED ORDER — FENTANYL CITRATE 0.05 MG/ML IJ SOLN
50.0000 ug | Freq: Once | INTRAMUSCULAR | Status: AC
  Administered 2012-10-21: 50 ug via INTRAVENOUS
  Filled 2012-10-21: qty 2

## 2012-10-21 MED ORDER — ONDANSETRON 8 MG PO TBDP: 8.0000 mg | ORAL_TABLET | Freq: Three times a day (TID) | ORAL | Status: DC | PRN

## 2012-10-21 MED ORDER — POTASSIUM CHLORIDE CRYS ER 20 MEQ PO TBCR: 20.0000 meq | EXTENDED_RELEASE_TABLET | Freq: Two times a day (BID) | ORAL | Status: DC

## 2012-10-21 NOTE — ED Notes (Signed)
Pt ambulated around nurses station without difficulty or distress. O2 on room air once returning to the room was 93%.

## 2012-10-22 ENCOUNTER — Ambulatory Visit (INDEPENDENT_AMBULATORY_CARE_PROVIDER_SITE_OTHER): Payer: Medicaid Other | Admitting: Internal Medicine

## 2012-10-22 ENCOUNTER — Encounter (INDEPENDENT_AMBULATORY_CARE_PROVIDER_SITE_OTHER): Payer: Self-pay | Admitting: Internal Medicine

## 2012-10-22 VITALS — BP 110/70 | HR 80 | Temp 98.8°F | Resp 18 | Ht 68.0 in | Wt 225.1 lb

## 2012-10-22 DIAGNOSIS — G4700 Insomnia, unspecified: Secondary | ICD-10-CM | POA: Insufficient documentation

## 2012-10-22 DIAGNOSIS — R112 Nausea with vomiting, unspecified: Secondary | ICD-10-CM | POA: Insufficient documentation

## 2012-10-22 DIAGNOSIS — K7010 Alcoholic hepatitis without ascites: Secondary | ICD-10-CM

## 2012-10-22 DIAGNOSIS — K709 Alcoholic liver disease, unspecified: Secondary | ICD-10-CM

## 2012-10-22 MED ORDER — LORAZEPAM 1 MG PO TABS: 1.0000 mg | ORAL_TABLET | Freq: Three times a day (TID) | ORAL | Status: DC

## 2012-10-22 MED ORDER — PANTOPRAZOLE SODIUM 40 MG PO TBEC: 40.0000 mg | DELAYED_RELEASE_TABLET | Freq: Every day | ORAL | Status: DC

## 2012-10-22 NOTE — Patient Instructions (Signed)
Continue taking potassium until prescription runs out. Blood work to be done on July 22,2014. Please talk with her health provider at [facility] about getting hepatitis A and B. Vaccination. EGD and colonoscopy to be scheduled in one month

## 2012-10-22 NOTE — Progress Notes (Signed)
Presenting complaint;  Nausea vomiting and abdominal pain.  Database; Patient is 57 year old Caucasian female who has alcoholic cirrhosis complicated by ascites which was diagnosed about 5 years ago when she was initially seen in [facility]. She abstained for 3 years and did great. She has gone back to drinking alcohol intermittently. She was last seen by Ms. Van Clines NP on 09/02/2012 portal extremity edema and maintain on low salt diet furosemide and spironolactone. She decided to drink alcohol again on Friday night which was July 4. She got sick again she develop nausea vomiting and abdominal pain. She was seen in emergency room 2 days ago treated and discharged and advised to followup with [place]. He does not have a primary care physician.  Subjective; She continues to feel poorly. She did vomit yesterday. Once he noted scant amount of blood in her vomitus. She denies melena but she did notice small amount of burgundy blood with her bowel movement. He continues to complain of pain in right upper quadrant of her abdomen. She complains of insomnia pain in her left hip and burning in both lower extremities. She denies fever or chills.   Current Medications: Current Outpatient Prescriptions  Medication Sig Dispense Refill  . B-Complex TABS Take 1 tablet by mouth daily.      . Calcium Carbonate-Vitamin D (CALCIUM 600+D) 600-400 MG-UNIT per tablet Take 1 tablet by mouth 2 (two) times daily.      . furosemide (LASIX) 20 MG tablet Take 1 tablet (20 mg total) by mouth daily.  30 tablet  4  . lactulose (CHRONULAC) 10 GM/15ML solution Take 10 g by mouth 2 (two) times daily.       . ondansetron (ZOFRAN ODT) 8 MG disintegrating tablet Take 1 tablet (8 mg total) by mouth every 8 (eight) hours as needed for nausea.  8 tablet  0  . potassium chloride SA (K-DUR,KLOR-CON) 20 MEQ tablet Take 1 tablet (20 mEq total) by mouth 2 (two) times daily.  8 tablet  0  . spironolactone (ALDACTONE) 25 MG tablet  Take 2 tablets (50 mg total) by mouth 2 (two) times daily.  60 tablet  4   No current facility-administered medications for this visit.     Objective: Blood pressure 110/70, pulse 80, temperature 98.8 F (37.1 C), temperature source Oral, resp. rate 18, height 5\' 8"  (1.727 m), weight 225 lb 1.6 oz (102.105 kg). Patient is alert and in no acute distress. Asterixis absent. Conjunctiva is pink. Sclera is slightly icteric. Oropharyngeal mucosa is normal. No neck masses or thyromegaly noted. Cardiac exam with regular rhythm normal S1 and S2. No murmur or gallop noted. Lungs are clear to auscultation. Abdomen is protuberant. Abdomen is soft. Liver edge is 4-5 cm below RCM and is mildly tender. He also has mild midepigastric tenderness. Shifting dullness is absent.  She has 1+ pitting edema involving her legs.  Labs/studies Results: Lab data from 10/20/2012. WBC 6.3, H&H 15.1 and 41.8. Platelet count 66K. INR 1.50 Serum sodium 133, potassium 3.1, chloride 94, CO2 30, glucose 102, BUN 8, creatinine 0.77. Bilirubin 3.7, AP 140, AST 78, ALT 33, albumin 2.7 and calcium 9.1. AFP was 6.2 on 07/10/2012.     Assessment:  #1. Nausea and vomiting most likely secondary to alcohol induced gastritis or alcoholic hepatitis. She reports vomiting small amount of blood. Her H&H was normal. She possibly had Mallory-Weiss tear. Will need to examine her upper GI tract to rule out peptic ulcer disease also look for varices. #2.  Alcoholic liver disease. She has alcoholic hepatitis on top of cirrhosis which was diagnosed about 5 years ago. Clinically she does not have ascites. #3. Hypokalemia being treated. #4. Insomnia. #5. Bilateral leg pain would appear to be secondary to peripheral neuropathy.   Plan:  Patient advised not to take acetaminophen. Pantoprazole 40 mg by mouth every morning. Lorazepam 1 mg by mouth each bedtime. Will schedule her for EGD and colonoscopy with propofol in few  weeks. Patient needs to have a primary care physician to address 9 GI issues. She also needs to be vaccinated for hepatitis A and B. She will stop potassium when she runs out of her prescription. Metabolic 7 and LFTs on 11/03/2012.

## 2012-10-23 ENCOUNTER — Telehealth (INDEPENDENT_AMBULATORY_CARE_PROVIDER_SITE_OTHER): Payer: Self-pay | Admitting: *Deleted

## 2012-10-23 DIAGNOSIS — K7010 Alcoholic hepatitis without ascites: Secondary | ICD-10-CM | POA: Insufficient documentation

## 2012-10-23 NOTE — Telephone Encounter (Signed)
Per Dr.Rehman the patient may get Prilosec OTC and take 1 by mouth daily. I tried calling the patient , her voicemail has not been set up. I called her daughter , Marcelino Duster , and gave her Dr.Rehman's recommendation, she is going to tell her Mother.

## 2012-10-23 NOTE — Telephone Encounter (Signed)
Dr. Karilyn Cota prescribed Protonix and it is going to cost her $67.00. Arnola can not afford this and would like to know if Dr. Karilyn Cota can give her something else.

## 2012-10-23 NOTE — Telephone Encounter (Signed)
Patient called - Gave her the message from Dr. Harlin Rain and voices understood.

## 2012-10-27 ENCOUNTER — Telehealth (INDEPENDENT_AMBULATORY_CARE_PROVIDER_SITE_OTHER): Payer: Self-pay | Admitting: *Deleted

## 2012-10-27 ENCOUNTER — Other Ambulatory Visit (INDEPENDENT_AMBULATORY_CARE_PROVIDER_SITE_OTHER): Payer: Self-pay | Admitting: *Deleted

## 2012-10-27 DIAGNOSIS — Z1211 Encounter for screening for malignant neoplasm of colon: Secondary | ICD-10-CM

## 2012-10-27 DIAGNOSIS — R112 Nausea with vomiting, unspecified: Secondary | ICD-10-CM

## 2012-10-27 MED ORDER — PEG-KCL-NACL-NASULF-NA ASC-C 100 G PO SOLR: 1.0000 | Freq: Once | ORAL | Status: DC

## 2012-10-27 NOTE — Telephone Encounter (Signed)
Patient needs movi prep 

## 2012-10-28 ENCOUNTER — Telehealth (INDEPENDENT_AMBULATORY_CARE_PROVIDER_SITE_OTHER): Payer: Self-pay | Admitting: *Deleted

## 2012-10-28 ENCOUNTER — Encounter (INDEPENDENT_AMBULATORY_CARE_PROVIDER_SITE_OTHER): Payer: Self-pay | Admitting: *Deleted

## 2012-10-28 NOTE — Progress Notes (Signed)
This encounter was created in error - please disregard.

## 2012-10-28 NOTE — Telephone Encounter (Signed)
1.Marissa Figueroa is need a Rx with diagnoses to get the Hep A & B vaccine [facility].  2. Also verified with [facility] at [facility], if Marissa Figueroa will bring her a Rx for the Protonix with singed paper that was faxed, they can get 12 months free for her. Paper work has been given to Marissa Ar, NP.

## 2012-10-28 NOTE — Telephone Encounter (Signed)
Paperwork filled out

## 2012-11-10 ENCOUNTER — Other Ambulatory Visit (INDEPENDENT_AMBULATORY_CARE_PROVIDER_SITE_OTHER): Payer: Self-pay | Admitting: Internal Medicine

## 2012-11-10 MED ORDER — LACTULOSE 10 GM/15ML PO SOLN: 10.0000 g | Freq: Two times a day (BID) | ORAL | Status: DC

## 2012-11-10 NOTE — Telephone Encounter (Signed)
Lactulose eprescribed

## 2012-11-11 ENCOUNTER — Encounter (HOSPITAL_COMMUNITY): Payer: Self-pay

## 2012-11-17 NOTE — Patient Instructions (Addendum)
Marissa Figueroa  11/17/2012   Your procedure is scheduled on:  11/25/12  Report to [facility] at [id number].  Call this number if you have problems the morning of surgery: [phone]   Remember:   Do not eat food or drink liquids after midnight.   Take these medicines the morning of surgery with A SIP OF WATER: protonix   Do not wear jewelry, make-up or nail polish.  Do not wear lotions, powders, or perfumes. You may wear deodorant.  Do not shave 48 hours prior to surgery. Men may shave face and neck.  Do not bring valuables to the hospital.  [facility] is not responsible                   for any belongings or valuables.  Contacts, dentures or bridgework may not be worn into surgery.  Leave suitcase in the car. After surgery it may be brought to your room.  For patients admitted to the hospital, checkout time is 11:00 AM the day of  discharge.   Patients discharged the day of surgery will not be allowed to drive  home.  Name and phone number of your driver: family  Special Instructions: Shower using CHG 2 nights before surgery and the night before surgery.  If you shower the day of surgery use CHG.  Use special wash - you have one bottle of CHG for all showers.  You should use approximately 1/3 of the bottle for each shower.   Please read over the following fact sheets that you were given: Pain Booklet, MRSA Information, Surgical Site Infection Prevention, Anesthesia Post-op Instructions and Care and Recovery After Surgery   PATIENT INSTRUCTIONS POST-ANESTHESIA  IMMEDIATELY FOLLOWING SURGERY:  Do not drive or operate machinery for the first twenty four hours after surgery.  Do not make any important decisions for twenty four hours after surgery or while taking narcotic pain medications or sedatives.  If you develop intractable nausea and vomiting or a severe headache please notify your doctor immediately.  FOLLOW-UP:  Please make an appointment with your surgeon as instructed. You do not  need to follow up with anesthesia unless specifically instructed to do so.  WOUND CARE INSTRUCTIONS (if applicable):  Keep a dry clean dressing on the anesthesia/puncture wound site if there is drainage.  Once the wound has quit draining you may leave it open to air.  Generally you should leave the bandage intact for twenty four hours unless there is drainage.  If the epidural site drains for more than 36-48 hours please call the anesthesia department.  QUESTIONS?:  Please feel free to call your physician or the hospital operator if you have any questions, and they will be happy to assist you.      Colonoscopy A colonoscopy is an exam to evaluate your entire colon. In this exam, your colon is cleansed. A long fiberoptic tube is inserted through your rectum and into your colon. The fiberoptic scope (endoscope) is a long bundle of enclosed and very flexible fibers. These fibers transmit light to the area examined and send images from that area to your caregiver. Discomfort is usually minimal. You may be given a drug to help you sleep (sedative) during or prior to the procedure. This exam helps to detect lumps (tumors), polyps, inflammation, and areas of bleeding. Your caregiver may also take a small piece of tissue (biopsy) that will be examined under a microscope. LET YOUR CAREGIVER KNOW ABOUT:   Allergies to food or medicine.  Medicines taken, including vitamins, herbs, eyedrops, over-the-counter medicines, and creams.  Use of steroids (by mouth or creams).  Previous problems with anesthetics or numbing medicines.  History of bleeding problems or blood clots.  Previous surgery.  Other health problems, including diabetes and kidney problems.  Possibility of pregnancy, if this applies. BEFORE THE PROCEDURE   A clear liquid diet may be required for 2 days before the exam.  Ask your caregiver about changing or stopping your regular medications.  Liquid injections (enemas) or laxatives may  be required.  A large amount of electrolyte solution may be given to you to drink over a short period of time. This solution is used to clean out your colon.  You should be present 60 minutes prior to your procedure or as directed by your caregiver. AFTER THE PROCEDURE   If you received a sedative or pain relieving medication, you will need to arrange for someone to drive you home.  Occasionally, there is a little blood passed with the first bowel movement. Do not be concerned. FINDING OUT THE RESULTS OF YOUR TEST Not all test results are available during your visit. If your test results are not back during the visit, make an appointment with your caregiver to find out the results. Do not assume everything is normal if you have not heard from your caregiver or the medical facility. It is important for you to follow up on all of your test results. HOME CARE INSTRUCTIONS   It is not unusual to pass moderate amounts of gas and experience mild abdominal cramping following the procedure. This is due to air being used to inflate your colon during the exam. Walking or a warm pack on your belly (abdomen) may help.  You may resume all normal meals and activities after sedatives and medicines have worn off.  Only take over-the-counter or prescription medicines for pain, discomfort, or fever as directed by your caregiver. Do not use aspirin or blood thinners if a biopsy was taken. Consult your caregiver for medicine usage if biopsies were taken. SEEK IMMEDIATE MEDICAL CARE IF:   You have a fever.  You pass large blood clots or fill a toilet with blood following the procedure. This may also occur 10 to 14 days following the procedure. This is more likely if a biopsy was taken.  You develop abdominal pain that keeps getting worse and cannot be relieved with medicine. Document Released: 03/29/2000 Document Revised: 06/24/2011 Document Reviewed: 11/12/2007 [facility] Patient Information 2014 [place],  [place]. Esophagogastroduodenoscopy Esophagogastroduodenoscopy (EGD) is a procedure to examine the lining of the esophagus, stomach, and first part of the small intestine (duodenum). A long, flexible, lighted tube with a camera attached (endoscope) is inserted down the throat to view these organs. This procedure is done to detect problems or abnormalities, such as inflammation, bleeding, ulcers, or growths, in order to treat them. The procedure lasts about 5 20 minutes. It is usually an outpatient procedure, but it may need to be performed in emergency cases in the hospital. LET YOUR CAREGIVER KNOW ABOUT:   Allergies to food or medicine.  All medicines you are taking, including vitamins, herbs, eyedrops, and over-the-counter medicines and creams.  Use of steroids (by mouth or creams).  Previous problems you or members of your family have had with the use of anesthetics.  Any blood disorders you have.  Previous surgeries you have had.  Other health problems you have.  Possibility of pregnancy, if this applies. RISKS AND COMPLICATIONS  Generally, EGD is  a safe procedure. However, as with any procedure, complications can occur. Possible complications include:  Infection.  Bleeding.  Tearing (perforation) of the esophagus, stomach, or duodenum.  Difficulty breathing or not being able to breath.  Excessive sweating.  Spasms of the larynx.  Slowed heartbeat.  Low blood pressure. BEFORE THE PROCEDURE  Do not eat or drink anything for 6 8 hours before the procedure or as directed by your caregiver.  Ask your caregiver about changing or stopping your regular medicines.  If you wear dentures, be prepared to remove them before the procedure.  Arrange for someone to drive you home after the procedure. PROCEDURE   A vein will be accessed to give medicines and fluids. A medicine to relax you (sedative) and a pain reliever will be given through that access into the vein.  A numbing  medicine (local anesthetic) may be sprayed on your throat for comfort and to stop you from gagging or coughing.  A mouth guard may be placed in your mouth to protect your teeth and to keep you from biting on the endoscope.  You will be asked to lie on your left side.  The endoscope is inserted down your throat and into the esophagus, stomach, and duodenum.  Air is put through the endoscope to allow your caregiver to view the lining of your esophagus clearly.  The esophagus, stomach, and duodenum is then examined. During the exam, your caregiver may:  Remove tissue to be examined under a microscope (biopsy) for inflammation, infection, or other medical problems.  Remove growths.  Remove objects (foreign bodies) that are stuck.  Treat any bleeding with medicines or other devices that stop tissues from bleeding (hot cauters, clipping devices).  Widen (dilate) or stretch narrowed areas of the esophagus and stomach.  The endoscope will then be withdrawn. AFTER THE PROCEDURE  You will be taken to a recovery area to be monitored. You will be able to go home once you are stable and alert.  Do not eat or drink anything until the local anesthetic and numbing medicines have worn off. You may choke.  It is normal to feel bloated, have pain with swallowing, or have a sore throat for a short time. This will wear off.  Your caregiver should be able to discuss his or her findings with you. It will take longer to discuss the test results if any biopsies were taken. Document Released: 08/02/2004 Document Revised: 03/18/2012 Document Reviewed: 03/04/2012 [facility] Patient Information 2014 [place], [place].

## 2012-11-18 ENCOUNTER — Encounter (HOSPITAL_COMMUNITY): Payer: Self-pay

## 2012-11-18 ENCOUNTER — Encounter (HOSPITAL_COMMUNITY)
Admission: RE | Admit: 2012-11-18 | Discharge: 2012-11-18 | Disposition: A | Discharge status: Home / Self Care | Payer: Medicaid Other | Source: Ambulatory Visit | Attending: Internal Medicine | Admitting: Internal Medicine

## 2012-11-18 ENCOUNTER — Other Ambulatory Visit: Payer: Self-pay

## 2012-11-18 DIAGNOSIS — R112 Nausea with vomiting, unspecified: Secondary | ICD-10-CM | POA: Insufficient documentation

## 2012-11-18 DIAGNOSIS — Z01812 Encounter for preprocedural laboratory examination: Secondary | ICD-10-CM | POA: Insufficient documentation

## 2012-11-18 HISTORY — DX: Shortness of breath: R06.02

## 2012-11-18 LAB — BASIC METABOLIC PANEL
CO2: 31 mEq/L (ref 19–32)
Chloride: 99 mEq/L (ref 96–112)
Glucose, Bld: 129 mg/dL — ABNORMAL HIGH (ref 70–99)
Sodium: 138 mEq/L (ref 135–145)

## 2012-11-18 LAB — HEPATIC FUNCTION PANEL
Albumin: 2.5 g/dL — ABNORMAL LOW (ref 3.5–5.2)
Alkaline Phosphatase: 162 U/L — ABNORMAL HIGH (ref 39–117)
Indirect Bilirubin: 1.7 mg/dL — ABNORMAL HIGH (ref 0.3–0.9)
Total Bilirubin: 2.6 mg/dL — ABNORMAL HIGH (ref 0.3–1.2)

## 2012-11-18 LAB — HEMOGLOBIN AND HEMATOCRIT, BLOOD: HCT: 42.7 % (ref 36.0–46.0)

## 2012-12-02 ENCOUNTER — Encounter (HOSPITAL_COMMUNITY)
Admission: RE | Admit: 2012-12-02 | Discharge: 2012-12-02 | Disposition: A | Discharge status: Home / Self Care | Payer: Self-pay | Source: Ambulatory Visit

## 2012-12-03 ENCOUNTER — Encounter (HOSPITAL_COMMUNITY): Payer: Self-pay

## 2012-12-21 ENCOUNTER — Ambulatory Visit (HOSPITAL_COMMUNITY): Admission: RE | Admit: 2012-12-21 | Payer: Medicaid Other | Source: Ambulatory Visit | Admitting: Internal Medicine

## 2012-12-21 ENCOUNTER — Encounter (HOSPITAL_COMMUNITY): Admission: RE | Payer: Self-pay | Source: Ambulatory Visit

## 2012-12-21 SURGERY — COLONOSCOPY WITH PROPOFOL
Anesthesia: Monitor Anesthesia Care

## 2013-01-02 ENCOUNTER — Emergency Department (HOSPITAL_COMMUNITY)
Admission: EM | Admit: 2013-01-02 | Discharge: 2013-01-02 | Disposition: A | Discharge status: Home / Self Care | Payer: Medicaid Other | Attending: Emergency Medicine | Admitting: Emergency Medicine

## 2013-01-02 ENCOUNTER — Encounter (HOSPITAL_COMMUNITY): Payer: Self-pay | Admitting: Emergency Medicine

## 2013-01-02 ENCOUNTER — Telehealth: Payer: Self-pay | Admitting: Gastroenterology

## 2013-01-02 DIAGNOSIS — F17200 Nicotine dependence, unspecified, uncomplicated: Secondary | ICD-10-CM | POA: Insufficient documentation

## 2013-01-02 DIAGNOSIS — R630 Anorexia: Secondary | ICD-10-CM | POA: Insufficient documentation

## 2013-01-02 DIAGNOSIS — M129 Arthropathy, unspecified: Secondary | ICD-10-CM | POA: Insufficient documentation

## 2013-01-02 DIAGNOSIS — K529 Noninfective gastroenteritis and colitis, unspecified: Secondary | ICD-10-CM

## 2013-01-02 DIAGNOSIS — R5381 Other malaise: Secondary | ICD-10-CM | POA: Insufficient documentation

## 2013-01-02 DIAGNOSIS — R42 Dizziness and giddiness: Secondary | ICD-10-CM | POA: Insufficient documentation

## 2013-01-02 DIAGNOSIS — K7460 Unspecified cirrhosis of liver: Secondary | ICD-10-CM | POA: Insufficient documentation

## 2013-01-02 DIAGNOSIS — I509 Heart failure, unspecified: Secondary | ICD-10-CM | POA: Insufficient documentation

## 2013-01-02 DIAGNOSIS — Z79899 Other long term (current) drug therapy: Secondary | ICD-10-CM | POA: Insufficient documentation

## 2013-01-02 DIAGNOSIS — K7010 Alcoholic hepatitis without ascites: Secondary | ICD-10-CM | POA: Insufficient documentation

## 2013-01-02 DIAGNOSIS — K5289 Other specified noninfective gastroenteritis and colitis: Secondary | ICD-10-CM | POA: Insufficient documentation

## 2013-01-02 LAB — COMPREHENSIVE METABOLIC PANEL
AST: 85 U/L — ABNORMAL HIGH (ref 0–37)
Albumin: 2.7 g/dL — ABNORMAL LOW (ref 3.5–5.2)
Calcium: 10.2 mg/dL (ref 8.4–10.5)
Chloride: 94 mEq/L — ABNORMAL LOW (ref 96–112)
Creatinine, Ser: 0.81 mg/dL (ref 0.50–1.10)
Total Bilirubin: 4.3 mg/dL — ABNORMAL HIGH (ref 0.3–1.2)
Total Protein: 7.7 g/dL (ref 6.0–8.3)

## 2013-01-02 LAB — RAPID URINE DRUG SCREEN, HOSP PERFORMED
Benzodiazepines: NOT DETECTED
Cocaine: NOT DETECTED
Opiates: NOT DETECTED

## 2013-01-02 LAB — URINE MICROSCOPIC-ADD ON

## 2013-01-02 LAB — CBC WITH DIFFERENTIAL/PLATELET
Basophils Absolute: 0 10*3/uL (ref 0.0–0.1)
Basophils Relative: 0 % (ref 0–1)
Eosinophils Relative: 4 % (ref 0–5)
Lymphocytes Relative: 31 % (ref 12–46)
MCV: 98.2 fL (ref 78.0–100.0)
Platelets: 74 10*3/uL — ABNORMAL LOW (ref 150–400)
RDW: 15 % (ref 11.5–15.5)
WBC: 7.7 10*3/uL (ref 4.0–10.5)

## 2013-01-02 LAB — URINALYSIS, ROUTINE W REFLEX MICROSCOPIC
Glucose, UA: NEGATIVE mg/dL
Leukocytes, UA: NEGATIVE
Specific Gravity, Urine: 1.015 (ref 1.005–1.030)
pH: 7 (ref 5.0–8.0)

## 2013-01-02 LAB — ETHANOL: Alcohol, Ethyl (B): <11 mg/dL (ref 0–11)

## 2013-01-02 MED ORDER — PROMETHAZINE HCL 25 MG PO TABS: 25.0000 mg | ORAL_TABLET | Freq: Four times a day (QID) | ORAL | Status: DC | PRN

## 2013-01-02 MED ORDER — ONDANSETRON HCL 4 MG/2ML IJ SOLN
4.0000 mg | Freq: Once | INTRAMUSCULAR | Status: AC
  Administered 2013-01-02: 4 mg via INTRAVENOUS
  Filled 2013-01-02: qty 2

## 2013-01-02 MED ORDER — SODIUM CHLORIDE 0.9 % IV BOLUS (SEPSIS)
1000.0000 mL | Freq: Once | INTRAVENOUS | Status: AC
  Administered 2013-01-02: 1000 mL via INTRAVENOUS

## 2013-01-02 NOTE — ED Notes (Signed)
Pt reports has cirrhosis of the liver.  Dr. Karilyn Cota stopped pt's potassium supplements a few months ago because potassium level had been normal.  Pt c/o feeling fatigued since last Tues or Wednesday.  Reports hasn't eaten hardly anything.  Reports vomiting "foamy emesis" and having diarrhea.  Pt says her daughter told her yesterday that it took her a while to wake her yesterday.

## 2013-01-02 NOTE — ED Notes (Signed)
Pt requesting to leave, EDP notified

## 2013-01-02 NOTE — Telephone Encounter (Signed)
PT THROWING UP FOR 3 DAYS. CAN'T KEEP DOWN WATER.. PT STATES LAST TIME SHE WAS LIKE THIS SHE HAD A LOW K AND NEEDED TO BE ADMITTED. PT ASKED SHOULD SHE COME TO ED AT [facility] OR [facility]. I EXPLAINED SHE SHOULD COME TO [facility] ED & IF SHE WANTS TO SEE DR. Karilyn Cota, HE WON'T BE IN THE HOSPITAL UNTIL MON. SHE WANTED TO KNOW IF THE ED AT [facility] WAS BUSY BECAUSE SHE KNOWS [facility] IS BUSY. SHE LIVES [place]CR[place] [address] FROM THE HOSPITAL.

## 2013-01-02 NOTE — ED Provider Notes (Signed)
CSN: [id number]     Arrival date & time 01/02/13  1630 History  This chart was scribed for Donnetta Hutching, MD by Shari Heritage, ED Scribe. The patient was seen in room APA16A/APA16A. Patient's care was started at 5:11 PM.    Chief Complaint  Patient presents with  . Emesis  . Fatigue    The history is provided by the patient. No language interpreter was used.    HPI Comments: Marissa Figueroa is a 57 y.o. female with history of cirrhosis of liver diagnosed in January 2010 who presents to the Emergency Department complaining of intermittent emesis of stomach contents onset 4 days ago. There is associated fatigue, dizziness, decreased appetite and intolerance to food. She states that the last time she had these symtpoms, she was worked up at [employer] and found to be hypokalemic. She states that Dr. Karilyn Cota instructed her to stop potassium supplements about 2-3 months ago after her potassium levels normalized. She states that she took on supplement this morning, but it did not help. She says that she is still drinking alcohol because it relaxes her. She also has a history of CHF.    Past Medical History  Diagnosis Date  . Cirrhosis     ALCOHOLIC  . Alcoholic cirrhosis of liver   . CHF (congestive heart failure)   . Shortness of breath     exertion  . Arthritis    Past Surgical History  Procedure Laterality Date  . Tubal ligation    . Tonsilectomy, adenoidectomy, bilateral myringotomy and tubes    . Paracentesis  07/06/08  . Paracentesis  05/25/2008   No family history on file. History  Substance Use Topics  . Smoking status: Current Every Day Smoker -- 0.25 packs/day for 35 years  . Smokeless tobacco: Never Used     Comment: down to 4cigarettes a week  . Alcohol Use: Yes     Comment: 1 wine cooler 3 weeks ago   OB History   Grav Para Term Preterm Abortions TAB SAB Ect Mult Living                 Review of Systems A complete 10 system review of systems was obtained and all  systems are negative except as noted in the HPI and PMH.   Allergies  Ultram  Home Medications   Current Outpatient Rx  Name  Route  Sig  Dispense  Refill  . B-Complex TABS   Oral   Take 1 tablet by mouth daily.         . Calcium Carbonate-Vitamin D (CALCIUM 600+D) 600-400 MG-UNIT per tablet   Oral   Take 1 tablet by mouth 2 (two) times daily.         . furosemide (LASIX) 20 MG tablet   Oral   Take 1 tablet (20 mg total) by mouth daily.   30 tablet   4   . lactulose (CHRONULAC) 10 GM/15ML solution   Oral   Take 15 mLs (10 g total) by mouth 2 (two) times daily.   240 mL   4   . pantoprazole (PROTONIX) 40 MG tablet   Oral   Take 1 tablet (40 mg total) by mouth daily.   30 tablet   5   . peg 3350 powder (MOVIPREP) 100 G SOLR   Oral   Take 1 kit (100 g total) by mouth once.   1 kit   0   . potassium chloride SA (K-DUR,KLOR-CON) 20 MEQ tablet  Oral   Take 1 tablet (20 mEq total) by mouth 2 (two) times daily.   8 tablet   0   . spironolactone (ALDACTONE) 25 MG tablet   Oral   Take 2 tablets (50 mg total) by mouth 2 (two) times daily.   60 tablet   4    Triage Vitals: BP 133/89  Pulse 93  Temp(Src) 98.8 F (37.1 C) (Oral)  Resp 20  Ht 5\' 8"  (1.727 m)  Wt 226 lb (102.513 kg)  BMI 34.37 kg/m2  SpO2 97% Physical Exam  Nursing note and vitals reviewed. Constitutional: She is oriented to person, place, and time. She appears well-developed and well-nourished.  HENT:  Head: Normocephalic and atraumatic.  Eyes: Conjunctivae and EOM are normal. Pupils are equal, round, and reactive to light.  Neck: Normal range of motion. Neck supple.  Cardiovascular: Normal rate, regular rhythm and normal heart sounds.   Pulmonary/Chest: Effort normal and breath sounds normal.  Abdominal: Soft. Bowel sounds are normal.  Musculoskeletal: Normal range of motion.  Neurological: She is alert and oriented to person, place, and time.  Skin: Skin is warm and dry.   Psychiatric: She has a normal mood and affect.    ED Course  Procedures (including critical care time) DIAGNOSTIC STUDIES: Oxygen Saturation is 97% on room air, adequate by my interpretation.    COORDINATION OF CARE: 5:16 PM- Will order IV fluids and check labs. Patient informed of current plan for treatment and evaluation and agrees with plan at this time.     Labs Review Labs Reviewed  CBC WITH DIFFERENTIAL - Abnormal; Notable for the following:    Hemoglobin 15.6 (*)    MCH 34.7 (*)    Platelets 74 (*)    All other components within normal limits  COMPREHENSIVE METABOLIC PANEL - Abnormal; Notable for the following:    Sodium 132 (*)    Chloride 94 (*)    Glucose, Bld 102 (*)    Albumin 2.7 (*)    AST 85 (*)    ALT 38 (*)    Alkaline Phosphatase 159 (*)    Total Bilirubin 4.3 (*)    GFR calc non Af Amer 80 (*)    All other components within normal limits  LIPASE, BLOOD - Abnormal; Notable for the following:    Lipase 65 (*)    All other components within normal limits  URINALYSIS, ROUTINE W REFLEX MICROSCOPIC - Abnormal; Notable for the following:    Hgb urine dipstick TRACE (*)    All other components within normal limits  URINE MICROSCOPIC-ADD ON - Abnormal; Notable for the following:    Squamous Epithelial / LPF MANY (*)    Bacteria, UA FEW (*)    All other components within normal limits  ETHANOL  URINE RAPID DRUG SCREEN (HOSP PERFORMED)    Imaging Review No results found.  MDM  No diagnosis found. Patient is nontoxic.  LFT's and bilirubin elevated.   Patient has known cirrhosis.   No clinical evidence of hepatic encephalopathy.  Patient feels better after IV fluids. Discharge medications Phenergan 25 mg  I personally performed the services described in this documentation, which was scribed in my presence. The recorded information has been reviewed and is accurate.    Donnetta Hutching, MD 01/02/13 2024

## 2013-01-04 NOTE — Telephone Encounter (Signed)
Apt has been scheduled for 01/11/13 with Dr. Karilyn Cota.

## 2013-01-04 NOTE — Telephone Encounter (Signed)
Patient cancelled EGD and TCS last month. H/o ETOH liver disease. Will make an appointment for OV

## 2013-01-11 ENCOUNTER — Ambulatory Visit (INDEPENDENT_AMBULATORY_CARE_PROVIDER_SITE_OTHER): Payer: Medicaid Other | Admitting: Internal Medicine

## 2013-01-11 ENCOUNTER — Telehealth (INDEPENDENT_AMBULATORY_CARE_PROVIDER_SITE_OTHER): Payer: Self-pay | Admitting: *Deleted

## 2013-01-11 NOTE — Telephone Encounter (Signed)
Dr.Rehman made aware. 

## 2013-01-11 NOTE — Telephone Encounter (Signed)
The lady that normally bring her to the apts is currently in the hospital. Wynona Canes rescheduled her apt for 02/16/13. "She is doing find and no problesm."

## 2013-02-16 ENCOUNTER — Ambulatory Visit (INDEPENDENT_AMBULATORY_CARE_PROVIDER_SITE_OTHER): Payer: Medicaid Other | Admitting: Internal Medicine

## 2013-02-16 ENCOUNTER — Encounter (INDEPENDENT_AMBULATORY_CARE_PROVIDER_SITE_OTHER): Payer: Self-pay | Admitting: Internal Medicine

## 2013-02-16 VITALS — BP 110/70 | HR 80 | Temp 97.8°F | Resp 18 | Ht 68.0 in | Wt 227.5 lb

## 2013-02-16 DIAGNOSIS — R1013 Epigastric pain: Secondary | ICD-10-CM

## 2013-02-16 DIAGNOSIS — K219 Gastro-esophageal reflux disease without esophagitis: Secondary | ICD-10-CM

## 2013-02-16 DIAGNOSIS — K7030 Alcoholic cirrhosis of liver without ascites: Secondary | ICD-10-CM

## 2013-02-16 DIAGNOSIS — K7010 Alcoholic hepatitis without ascites: Secondary | ICD-10-CM

## 2013-02-16 DIAGNOSIS — K5900 Constipation, unspecified: Secondary | ICD-10-CM

## 2013-02-16 DIAGNOSIS — R188 Other ascites: Secondary | ICD-10-CM

## 2013-02-16 MED ORDER — PANTOPRAZOLE SODIUM 40 MG PO TBEC: 40.0000 mg | DELAYED_RELEASE_TABLET | Freq: Every day | ORAL | Status: DC

## 2013-02-16 MED ORDER — LACTULOSE 10 GM/15ML PO SOLN: 10.0000 g | Freq: Two times a day (BID) | ORAL | Status: DC

## 2013-02-16 NOTE — Patient Instructions (Signed)
Physician will contact her with results of blood work. Liver ultrasound to be done in December 2014

## 2013-02-16 NOTE — Progress Notes (Signed)
Presenting complaint;  Followup for alcoholic liver disease nausea vomiting and epigastric pain.  Subjective:  Patient is 57 year old Caucasian female who has history of alcoholic cirrhosis which was diagnosed in 2010 when she presented with new onset of ascites. She was doing fine until she went back to drinking again. She was seen in July 2010 for nausea vomiting abdominal pain jaundice and elevated transaminases. She was treated with lorazepam and begun on pantoprazole. She was scheduled for EGD and colonoscopy which she decided to postpone. She was seen in emergency room about 6 weeks ago for nausea vomiting and weakness. She was noted to have a lump in her transaminases and bilirubin but serum potassium was normal. She states since her ER visit she only had one drink of alcohol. She states she is feeling better. She says abdominal pain has resolved and she has not had any more spells of vomiting since her ER visit 7 weeks ago. She denies abdominal distention. She has very good appetite. She is trying to walk at least half a mile 4-5 times a week. She is having formed stools without melena or rectal bleeding.  Current Medications: Current Outpatient Prescriptions  Medication Sig Dispense Refill  . B-Complex TABS Take 1 tablet by mouth daily.      . Calcium Carbonate-Vitamin D (CALCIUM 600+D) 600-400 MG-UNIT per tablet Take 1 tablet by mouth 2 (two) times daily.      . furosemide (LASIX) 20 MG tablet Take 1 tablet (20 mg total) by mouth daily.  30 tablet  4  . lactulose (CHRONULAC) 10 GM/15ML solution Take 15 mLs (10 g total) by mouth 2 (two) times daily.  240 mL  4  . pantoprazole (PROTONIX) 20 MG tablet Take 20 mg by mouth daily.      . potassium chloride SA (K-DUR,KLOR-CON) 20 MEQ tablet Take 1 tablet (20 mEq total) by mouth 2 (two) times daily.  8 tablet  0  . promethazine (PHENERGAN) 25 MG tablet Take 1 tablet (25 mg total) by mouth every 6 (six) hours as needed for nausea.  20 tablet  0   . spironolactone (ALDACTONE) 25 MG tablet Take 2 tablets (50 mg total) by mouth 2 (two) times daily.  60 tablet  4   No current facility-administered medications for this visit.     Objective: Blood pressure 110/70, pulse 80, temperature 97.8 F (36.6 C), temperature source Oral, resp. rate 18, height 5\' 8"  (1.727 m), weight 227 lb 8 oz (103.193 kg). Patient is alert and in no acute distress. She does not have asterixis. Conjunctiva is pink. Sclera is nonicteric Oropharyngeal mucosa is normal. No neck masses or thyromegaly noted. Cardiac exam with regular rhythm normal S1 and S2. No murmur or gallop noted. Lungs are clear to auscultation. Abdomen is full. It is soft and nontender. Shifting dullness is absent. Spleen is not palpable. Liver is firm but 4 cm below RCM at end inspiration. No LE edema or clubbing noted.  Labs/studies Results: Studies from 01/02/2013 reviewed. Bilirubin was 4.3, AP 159, AST 85, ALT 38 and albumin 2.7. Last CT was in March 2014 revealing changes of cirrhosis and portal hypertension no evidence of ascites.     Assessment:  #1. Alcoholic hepatitis. Bilirubin and transaminases went up in parallel with her drinking alcohol. Prognosis is very poor unless she quits drinking alcohol altogether. He went and she is at risk for cecum only of cirrhosis. #2. Alcoholic cirrhosis. Ascites well controlled with therapy. #3. Epigastric pain has resolved. Possibly secondary  to alcoholic gastritis or peptic ulcer disease. #4. Nausea vomiting has resolved. Felt to be primarily due to alcoholic gastritis. She possibly also has GERD. #5. Constipation. She is doing well with lactulose.   Plan:  Patient will go to the lab for comprehensive chemistry panel and AFP. Abdominal ultrasound for HCC screening after the holidays. New prescription given for pantoprazole lactulose. Office visit in 4 months.

## 2013-03-29 ENCOUNTER — Other Ambulatory Visit (INDEPENDENT_AMBULATORY_CARE_PROVIDER_SITE_OTHER): Payer: Self-pay | Admitting: Internal Medicine

## 2013-03-30 ENCOUNTER — Ambulatory Visit (HOSPITAL_COMMUNITY): Payer: Medicaid Other

## 2013-06-14 ENCOUNTER — Telehealth (INDEPENDENT_AMBULATORY_CARE_PROVIDER_SITE_OTHER): Payer: Self-pay | Admitting: *Deleted

## 2013-06-14 DIAGNOSIS — K5289 Other specified noninfective gastroenteritis and colitis: Secondary | ICD-10-CM

## 2013-06-14 DIAGNOSIS — K7010 Alcoholic hepatitis without ascites: Secondary | ICD-10-CM

## 2013-06-14 DIAGNOSIS — K7460 Unspecified cirrhosis of liver: Secondary | ICD-10-CM

## 2013-06-14 NOTE — Telephone Encounter (Signed)
Those labs have been reordered and released to [facility].

## 2013-06-14 NOTE — Telephone Encounter (Signed)
Marissa Figueroa didn't get her lab work done in Dec. 2014. She has canceled her apt with Dr. Karilyn Figueroa on 06/15/13 because she fell on the ice. The apt has been rescheduled for 06/29/13 with Marissa Arerri Setzer, NP. [facility] so she can get them done before her next apt.

## 2013-06-15 ENCOUNTER — Telehealth (INDEPENDENT_AMBULATORY_CARE_PROVIDER_SITE_OTHER): Payer: Self-pay | Admitting: *Deleted

## 2013-06-15 ENCOUNTER — Ambulatory Visit (INDEPENDENT_AMBULATORY_CARE_PROVIDER_SITE_OTHER): Payer: Medicaid Other | Admitting: Internal Medicine

## 2013-06-15 NOTE — Telephone Encounter (Signed)
Noted  

## 2013-06-15 NOTE — Telephone Encounter (Signed)
Marissa Figueroa called this morning, 03/03/215, and ask that we do not give her daughter any information about her apt. Marissa Figueroa had canceled her apt because she had fallen on the ice and is having trouble getting around.  Later on, the daughter did call and ask that I document in her mother's chart, that her mother has bruises all over her body that has her very concerned. Marcelino DusterMichelle also stated her mother has not fallen.

## 2013-06-27 ENCOUNTER — Emergency Department (HOSPITAL_COMMUNITY): Payer: Medicaid Other

## 2013-06-27 ENCOUNTER — Emergency Department (HOSPITAL_COMMUNITY)
Admission: EM | Admit: 2013-06-27 | Discharge: 2013-06-27 | Disposition: A | Discharge status: Home / Self Care | Payer: Medicaid Other | Attending: Emergency Medicine | Admitting: Emergency Medicine

## 2013-06-27 ENCOUNTER — Encounter (HOSPITAL_COMMUNITY): Payer: Self-pay | Admitting: Emergency Medicine

## 2013-06-27 DIAGNOSIS — Z9851 Tubal ligation status: Secondary | ICD-10-CM | POA: Insufficient documentation

## 2013-06-27 DIAGNOSIS — R112 Nausea with vomiting, unspecified: Secondary | ICD-10-CM | POA: Insufficient documentation

## 2013-06-27 DIAGNOSIS — Z8719 Personal history of other diseases of the digestive system: Secondary | ICD-10-CM | POA: Insufficient documentation

## 2013-06-27 DIAGNOSIS — N390 Urinary tract infection, site not specified: Secondary | ICD-10-CM

## 2013-06-27 DIAGNOSIS — Z8739 Personal history of other diseases of the musculoskeletal system and connective tissue: Secondary | ICD-10-CM | POA: Insufficient documentation

## 2013-06-27 DIAGNOSIS — R109 Unspecified abdominal pain: Secondary | ICD-10-CM

## 2013-06-27 DIAGNOSIS — I509 Heart failure, unspecified: Secondary | ICD-10-CM | POA: Insufficient documentation

## 2013-06-27 DIAGNOSIS — Z79899 Other long term (current) drug therapy: Secondary | ICD-10-CM | POA: Insufficient documentation

## 2013-06-27 DIAGNOSIS — F17200 Nicotine dependence, unspecified, uncomplicated: Secondary | ICD-10-CM | POA: Insufficient documentation

## 2013-06-27 LAB — CBC WITH DIFFERENTIAL/PLATELET
BASOS PCT: 1 % (ref 0–1)
Basophils Absolute: 0 10*3/uL (ref 0.0–0.1)
EOS ABS: 0.1 10*3/uL (ref 0.0–0.7)
EOS PCT: 2 % (ref 0–5)
HCT: 42.7 % (ref 36.0–46.0)
Hemoglobin: 14.9 g/dL (ref 12.0–15.0)
LYMPHS ABS: 2.3 10*3/uL (ref 0.7–4.0)
Lymphocytes Relative: 33 % (ref 12–46)
MCH: 35.2 pg — AB (ref 26.0–34.0)
MCHC: 34.9 g/dL (ref 30.0–36.0)
MCV: 100.9 fL — AB (ref 78.0–100.0)
Monocytes Absolute: 0.5 10*3/uL (ref 0.1–1.0)
Monocytes Relative: 8 % (ref 3–12)
Neutro Abs: 4 10*3/uL (ref 1.7–7.7)
Neutrophils Relative %: 57 % (ref 43–77)
PLATELETS: 72 10*3/uL — AB (ref 150–400)
RBC: 4.23 MIL/uL (ref 3.87–5.11)
RDW: 18.4 % — ABNORMAL HIGH (ref 11.5–15.5)
WBC: 7 10*3/uL (ref 4.0–10.5)

## 2013-06-27 LAB — URINALYSIS, ROUTINE W REFLEX MICROSCOPIC
Glucose, UA: 100 mg/dL — AB
Hgb urine dipstick: NEGATIVE
Leukocytes, UA: NEGATIVE
NITRITE: POSITIVE — AB
SPECIFIC GRAVITY, URINE: 1.025 (ref 1.005–1.030)
pH: 5.5 (ref 5.0–8.0)

## 2013-06-27 LAB — COMPREHENSIVE METABOLIC PANEL
ALT: 94 U/L — AB (ref 0–35)
AST: 158 U/L — ABNORMAL HIGH (ref 0–37)
Albumin: 2.5 g/dL — ABNORMAL LOW (ref 3.5–5.2)
Alkaline Phosphatase: 191 U/L — ABNORMAL HIGH (ref 39–117)
BUN: 12 mg/dL (ref 6–23)
CALCIUM: 9.1 mg/dL (ref 8.4–10.5)
CO2: 34 mEq/L — ABNORMAL HIGH (ref 19–32)
Chloride: 88 mEq/L — ABNORMAL LOW (ref 96–112)
Creatinine, Ser: 0.79 mg/dL (ref 0.50–1.10)
GFR calc non Af Amer: >90 mL/min (ref >90)
GLUCOSE: 163 mg/dL — AB (ref 70–99)
Potassium: 3.1 mEq/L — ABNORMAL LOW (ref 3.7–5.3)
SODIUM: 133 meq/L — AB (ref 137–147)
TOTAL PROTEIN: 7.9 g/dL (ref 6.0–8.3)
Total Bilirubin: 7.5 mg/dL — ABNORMAL HIGH (ref 0.3–1.2)

## 2013-06-27 LAB — URINE MICROSCOPIC-ADD ON

## 2013-06-27 LAB — APTT: APTT: 35 s (ref 24–37)

## 2013-06-27 LAB — LIPASE, BLOOD: LIPASE: 56 U/L (ref 11–59)

## 2013-06-27 LAB — AMMONIA: AMMONIA: 46 umol/L (ref 11–60)

## 2013-06-27 MED ORDER — SODIUM CHLORIDE 0.9 % IV BOLUS (SEPSIS)
1000.0000 mL | Freq: Once | INTRAVENOUS | Status: AC
  Administered 2013-06-27: 1000 mL via INTRAVENOUS

## 2013-06-27 MED ORDER — NITROFURANTOIN MONOHYD MACRO 100 MG PO CAPS
100.0000 mg | ORAL_CAPSULE | Freq: Once | ORAL | Status: AC
  Administered 2013-06-27: 100 mg via ORAL

## 2013-06-27 MED ORDER — NITROFURANTOIN MONOHYD MACRO 100 MG PO CAPS
ORAL_CAPSULE | ORAL | Status: AC
  Administered 2013-06-27: 100 mg via ORAL
  Filled 2013-06-27: qty 1

## 2013-06-27 MED ORDER — NITROFURANTOIN MONOHYD MACRO 100 MG PO CAPS: 100.0000 mg | ORAL_CAPSULE | Freq: Two times a day (BID) | ORAL | Status: DC

## 2013-06-27 MED ORDER — ONDANSETRON 4 MG PO TBDP: 4.0000 mg | ORAL_TABLET | Freq: Three times a day (TID) | ORAL | Status: DC | PRN

## 2013-06-27 NOTE — ED Provider Notes (Signed)
CSN: [id number]     Arrival date & time 06/27/13  1527 History   First MD Initiated Contact with Patient 06/27/13 1538     Chief Complaint  Patient presents with  . Constipation      HPI  Patient presents with concern of decreased bowel output, nausea with postprandial emesis, and generalized fatigue. Symptoms began approximately 4 days ago.  Since onset symptoms have been persistent. Patient notes that in spite of using MiraLAX, enema, she has had minimal stool production, though she had some day. During the time she has had inability to tolerate oral intake due to postprandial emesis. Patient notes that she does have a history of cirrhosis, though she continues to continue drinking. There is mild associated occasional abdominal pain, crampy, mid, nonradiating.  Past Medical History  Diagnosis Date  . Cirrhosis     ALCOHOLIC  . Alcoholic cirrhosis of liver   . CHF (congestive heart failure)   . Shortness of breath     exertion  . Arthritis    Past Surgical History  Procedure Laterality Date  . Tubal ligation    . Tonsilectomy, adenoidectomy, bilateral myringotomy and tubes    . Paracentesis  07/06/08  . Paracentesis  05/25/2008   No family history on file. History  Substance Use Topics  . Smoking status: Current Every Day Smoker -- 0.25 packs/day for 35 years  . Smokeless tobacco: Never Used     Comment: Patient states that she smokes 2 a day  . Alcohol Use: Yes   OB History   Grav Para Term Preterm Abortions TAB SAB Ect Mult Living                 Review of Systems  Constitutional:       Per HPI, otherwise negative  HENT:       Per HPI, otherwise negative  Respiratory:       Per HPI, otherwise negative  Cardiovascular:       Per HPI, otherwise negative  Gastrointestinal: Positive for nausea and vomiting.  Endocrine:       Negative aside from HPI  Genitourinary:       Neg aside from HPI   Musculoskeletal:       Per HPI, otherwise negative  Skin:  Negative.   Neurological: Negative for syncope.      Allergies  Ultram  Home Medications   Current Outpatient Rx  Name  Route  Sig  Dispense  Refill  . B-Complex TABS   Oral   Take 1 tablet by mouth daily.         . Calcium Carbonate-Vitamin D (CALCIUM 600+D) 600-400 MG-UNIT per tablet   Oral   Take 1 tablet by mouth 2 (two) times daily.         . furosemide (LASIX) 20 MG tablet      TAKE ONE TABLET BY MOUTH ONCE DAILY   30 tablet   2   . lactulose (CHRONULAC) 10 GM/15ML solution   Oral   Take 15 mLs (10 g total) by mouth 2 (two) times daily.   960 mL   5   . pantoprazole (PROTONIX) 40 MG tablet   Oral   Take 1 tablet (40 mg total) by mouth daily.   90 tablet   3   . potassium chloride SA (K-DUR,KLOR-CON) 20 MEQ tablet   Oral   Take 1 tablet (20 mEq total) by mouth 2 (two) times daily.   8 tablet   0   .  spironolactone (ALDACTONE) 25 MG tablet   Oral   Take 2 tablets (50 mg total) by mouth 2 (two) times daily.   60 tablet   4    BP 152/80  Pulse 94  Temp(Src) 98.1 F (36.7 C) (Oral)  Resp 20  Ht 5\' 8"  (1.727 m)  Wt 218 lb 9.6 oz (99.156 kg)  BMI 33.25 kg/m2  SpO2 96% Physical Exam  Nursing note and vitals reviewed. Constitutional: She is oriented to person, place, and time. She appears well-developed and well-nourished. No distress.  HENT:  Head: Normocephalic and atraumatic.  Eyes: Conjunctivae and EOM are normal.  Cardiovascular: Normal rate and regular rhythm.   Pulmonary/Chest: Effort normal and breath sounds normal. No stridor. No respiratory distress.  Abdominal: She exhibits no distension. There is no tenderness. There is no rebound.  Musculoskeletal: She exhibits no edema.  Neurological: She is alert and oriented to person, place, and time. No cranial nerve deficit.  Skin: Skin is warm and dry.  Psychiatric: She has a normal mood and affect.    ED Course  Procedures (including critical care time) Labs Review Labs Reviewed   CBC WITH DIFFERENTIAL - Abnormal; Notable for the following:    MCV 100.9 (*)    MCH 35.2 (*)    RDW 18.4 (*)    Platelets 72 (*)    All other components within normal limits  COMPREHENSIVE METABOLIC PANEL - Abnormal; Notable for the following:    Sodium 133 (*)    Potassium 3.1 (*)    Chloride 88 (*)    CO2 34 (*)    Glucose, Bld 163 (*)    Albumin 2.5 (*)    AST 158 (*)    ALT 94 (*)    Alkaline Phosphatase 191 (*)    Total Bilirubin 7.5 (*)    All other components within normal limits  URINALYSIS, ROUTINE W REFLEX MICROSCOPIC - Abnormal; Notable for the following:    Color, Urine AMBER (*)    APPearance CLOUDY (*)    Glucose, UA 100 (*)    Bilirubin Urine LARGE (*)    Ketones, ur TRACE (*)    Protein, ur TRACE (*)    Urobilinogen, UA >8.0 (*)    Nitrite POSITIVE (*)    All other components within normal limits  URINE MICROSCOPIC-ADD ON - Abnormal; Notable for the following:    Squamous Epithelial / LPF FEW (*)    Bacteria, UA MANY (*)    All other components within normal limits  LIPASE, BLOOD  APTT  AMMONIA   Imaging Review Dg Abd Acute W/chest  06/27/2013   CLINICAL DATA:  Abdominal pain.  Constipation.  EXAM: ACUTE ABDOMEN SERIES (ABDOMEN 2 VIEW & CHEST 1 VIEW)  COMPARISON:  CT scan abdomen dated 06/26/2012 and chest x-ray dated 04/12/2007  FINDINGS: Heart and lungs appear normal. No free air in the abdomen. Bowel gas pattern is normal. No extensive stool in the colon. No acute osseous abnormality. Slight degenerative changes of the hips, left more than right. Arterial vascular calcifications.  IMPRESSION: Benign appearing abdomen and chest.   Electronically Signed   By: Geanie Cooley M.D.   On: 06/27/2013 17:20     I reviewed the chart   6:51 PM Patient is awake, alert, and in no distress. On rectal exam there is no obstructing mass, no appreciable stool ball. We had a lengthy conversation with her friend in the room about her abdominal discomfort, nausea,  alcoholism, cirrhosis. Patient voiced understanding of  the need to followup in 2 days as scheduled, as well as return precautions.  MDM   Final diagnoses:  Abdominal pain  Nausea and vomiting in adult  UTI (lower urinary tract infection)    This patient with alcoholic cirrhosis presents with several days of new stool output, and her abdominal pain, nausea. On exam patient is awake, alert, afebrile, hemodynamically stable. Patient has mild abdominal discomfort, no x-ray evidence of obstruction. The patient's labs demonstrate progression of her hepatic disease.  In addition, the patient continues to drink alcohol. Patient's labs also demonstrated possible urinary tract infection. However, the patient was in no distress throughout her emergency department course, with no decompensation, no evidence of systemic compromise. Patient started on medication, and she has followup scheduled in 48 hours already.     Gerhard Munchobert Truth Wolaver, MD 06/27/13 [phone]

## 2013-06-27 NOTE — ED Notes (Addendum)
NT went in to see if pt had to void for urine sample. Pt reports " if i had another bag of fluid I may can go on my own." EDP aware and give order to admin second bolus of NS.pt tolerating well.

## 2013-06-27 NOTE — ED Notes (Signed)
Pt sitting on bedside commode attempting to give urine sample.

## 2013-06-27 NOTE — ED Notes (Signed)
Pt c/o constipation. lnbm Monday. No relief from lactulose and miralax and fleets enema. Pt c/o n/v since Tuesday.

## 2013-06-27 NOTE — Discharge Instructions (Signed)
As discussed, it is very important that you followup with your physician in 2 days.  Return here for concerning changes in your condition.

## 2013-06-29 ENCOUNTER — Encounter (INDEPENDENT_AMBULATORY_CARE_PROVIDER_SITE_OTHER): Payer: Self-pay | Admitting: Internal Medicine

## 2013-06-29 ENCOUNTER — Ambulatory Visit (INDEPENDENT_AMBULATORY_CARE_PROVIDER_SITE_OTHER): Payer: Medicaid Other | Admitting: Internal Medicine

## 2013-06-29 VITALS — BP 110/72 | HR 88 | Temp 98.3°F | Ht 68.0 in | Wt 220.0 lb

## 2013-06-29 DIAGNOSIS — K7030 Alcoholic cirrhosis of liver without ascites: Secondary | ICD-10-CM

## 2013-06-29 NOTE — Patient Instructions (Addendum)
Magnesium Citrate for constipation.  Call with results.  Cmet in 2 weeks. Clear liquid diet and then progress to full liquids. OV in 6 weeks.

## 2013-06-29 NOTE — Progress Notes (Signed)
Subjective:     Patient ID: Marissa Figueroa, female   DOB: [birth year], 58 y.o.   MRN: [id number]  HPI Here today for f/u of her alcoholic liver disease which was diagnosed in 2010 when she presented with new onset of ascites. She stopped drinking for 3 yrs but now has started back drinking intermittently.  Recently seen in the ED with c/o vomiting and generalized fatigue x 4 days. Abdominal xray:IMPRESSION:  Benign appearing abdomen and chest.   She also c/o constipation. Her last BM was a week ago. She has tried lactulose, Miralax, and even has taken an enema without results. Hx of alcoholic cirrhosis. She took a drink of Vodka last week.  Her appetite is not good. She tells me she is not keeping any thing down.She has lost 7 pound since her last visit  In November. She started the Zofran last night.  No abdominal pain today.    There was no fever associated with her symptoms.  CMP     Component Value Date/Time   NA 133* 06/27/2013 1607   NA 137 05/24/2009   K 3.1* 06/27/2013 1607   CL 88* 06/27/2013 1607   CO2 34* 06/27/2013 1607   GLUCOSE 163* 06/27/2013 1607   BUN 12 06/27/2013 1607   CREATININE 0.79 06/27/2013 1607   CREATININE 0.77 09/02/2012 1008   CALCIUM 9.1 06/27/2013 1607   PROT 7.9 06/27/2013 1607   ALBUMIN 2.5* 06/27/2013 1607   AST 158* 06/27/2013 1607   ALT 94* 06/27/2013 1607   ALKPHOS 191* 06/27/2013 1607   BILITOT 7.5* 06/27/2013 1607   GFRNONAA >90 06/27/2013 1607   GFRAA >90 06/27/2013 1607   CBC    Component Value Date/Time   WBC 7.0 06/27/2013 1607   RBC 4.23 06/27/2013 1607   HGB 14.9 06/27/2013 1607   HCT 42.7 06/27/2013 1607   PLT 72* 06/27/2013 1607   MCV 100.9* 06/27/2013 1607   MCH 35.2* 06/27/2013 1607   MCHC 34.9 06/27/2013 1607   RDW 18.4* 06/27/2013 1607   LYMPHSABS 2.3 06/27/2013 1607   MONOABS 0.5 06/27/2013 1607   EOSABS 0.1 06/27/2013 1607   BASOSABS 0.0 06/27/2013 1607      Review of Systems Past Medical History  Diagnosis Date  . Cirrhosis    ALCOHOLIC  . Alcoholic cirrhosis of liver   . CHF (congestive heart failure)   . Shortness of breath     exertion    Past Surgical History  Procedure Laterality Date  . Tubal ligation    . Tonsilectomy, adenoidectomy, bilateral myringotomy and tubes    . Paracentesis  07/06/08  . Paracentesis  05/25/2008    Allergies  Allergen Reactions  . Ultram [Tramadol] Other (See Comments)    Headache    Current Outpatient Prescriptions on File Prior to Visit  Medication Sig Dispense Refill  . B-Complex TABS Take 1 tablet by mouth daily.      . Calcium Carbonate-Vitamin D (CALCIUM 600+D) 600-400 MG-UNIT per tablet Take 1 tablet by mouth 2 (two) times daily.      . furosemide (LASIX) 20 MG tablet Take 20 mg by mouth daily.      [name] Kitchen lactulose (CHRONULAC) 10 GM/15ML solution Take 15 mLs (10 g total) by mouth 2 (two) times daily.  960 mL  5  . Milk Thistle 150 MG CAPS Take 150 mg by mouth daily.      . nitrofurantoin, macrocrystal-monohydrate, (MACROBID) 100 MG capsule Take 1 capsule (100 mg total) by mouth 2 (two)  times daily.  10 capsule  0  . ondansetron (ZOFRAN ODT) 4 MG disintegrating tablet Take 1 tablet (4 mg total) by mouth every 8 (eight) hours as needed for nausea or vomiting.  20 tablet  0  . pantoprazole (PROTONIX) 40 MG tablet Take 1 tablet (40 mg total) by mouth daily.  90 tablet  3  . potassium chloride SA (K-DUR,KLOR-CON) 20 MEQ tablet Take 20 mEq by mouth 2 (two) times a week.      . spironolactone (ALDACTONE) 25 MG tablet Take 50 mg by mouth daily.       No current facility-administered medications on file prior to visit.        Objective:   Physical Exam  Filed Vitals:   06/29/13 1550  BP: 110/72  Pulse: 88  Temp: 98.3 F (36.8 C)  Height: 5\' 8"  (1.727 m)  Weight: 220 lb (99.791 kg)   Alert and oriented. Skin warm and dry. Oral mucosa is moist.   . Sclera icteric, conjunctivae is pink. Thyroid not enlarged. No cervical lymphadenopathy. Lungs clear. Heart regular rate  and rhythm.  Abdomen is soft. Bowel sounds are positive. No hepatomegaly. No abdominal masses felt. No tenderness.  1 edema to lower extremities.       Assessment:    Alcoholic liver disease. Her bilirubin is climibing. She says she only had one drink which was last week. Her prognosis is poor.     Plan:    CMET in 2 weeks. OV 6 weeks. She need to stop drinking all together. Clear liquid diet for next 48 hrs and then progress to bland diet. Mag citrate. Call me with results.

## 2013-06-30 ENCOUNTER — Telehealth (INDEPENDENT_AMBULATORY_CARE_PROVIDER_SITE_OTHER): Payer: Self-pay | Admitting: *Deleted

## 2013-06-30 DIAGNOSIS — K7030 Alcoholic cirrhosis of liver without ascites: Secondary | ICD-10-CM

## 2013-06-30 NOTE — Telephone Encounter (Signed)
Patient had good results with the Mag Citrate

## 2013-06-30 NOTE — Telephone Encounter (Signed)
.  Per Delrae Renderri Setzer,NP patient to have done in 2 weeks.

## 2013-06-30 NOTE — Telephone Encounter (Signed)
Wynona CanesChristine took the medicine Terri ask her too. Would like to speak with Dorene Arerri Setzer, NP if she could return the call at [phone].

## 2013-07-07 ENCOUNTER — Encounter (INDEPENDENT_AMBULATORY_CARE_PROVIDER_SITE_OTHER): Payer: Self-pay | Admitting: *Deleted

## 2013-07-07 ENCOUNTER — Other Ambulatory Visit (INDEPENDENT_AMBULATORY_CARE_PROVIDER_SITE_OTHER): Payer: Self-pay | Admitting: *Deleted

## 2013-07-07 DIAGNOSIS — K7030 Alcoholic cirrhosis of liver without ascites: Secondary | ICD-10-CM

## 2013-08-10 ENCOUNTER — Encounter (INDEPENDENT_AMBULATORY_CARE_PROVIDER_SITE_OTHER): Payer: Self-pay | Admitting: Internal Medicine

## 2013-08-10 ENCOUNTER — Ambulatory Visit (INDEPENDENT_AMBULATORY_CARE_PROVIDER_SITE_OTHER): Payer: Medicaid Other | Admitting: Internal Medicine

## 2013-08-10 VITALS — BP 126/50 | HR 80 | Temp 98.4°F | Ht 68.0 in | Wt 212.2 lb

## 2013-08-10 DIAGNOSIS — K7030 Alcoholic cirrhosis of liver without ascites: Secondary | ICD-10-CM

## 2013-08-10 LAB — CBC WITH DIFFERENTIAL/PLATELET
BASOS ABS: 0.1 10*3/uL (ref 0.0–0.1)
BASOS PCT: 1 % (ref 0–1)
Eosinophils Absolute: 0.2 10*3/uL (ref 0.0–0.7)
Eosinophils Relative: 3 % (ref 0–5)
HEMATOCRIT: 41.8 % (ref 36.0–46.0)
Hemoglobin: 14.8 g/dL (ref 12.0–15.0)
LYMPHS PCT: 31 % (ref 12–46)
Lymphs Abs: 2.4 10*3/uL (ref 0.7–4.0)
MCH: 34.6 pg — ABNORMAL HIGH (ref 26.0–34.0)
MCHC: 35.4 g/dL (ref 30.0–36.0)
MCV: 97.7 fL (ref 78.0–100.0)
MONO ABS: 0.8 10*3/uL (ref 0.1–1.0)
Monocytes Relative: 10 % (ref 3–12)
NEUTROS ABS: 4.3 10*3/uL (ref 1.7–7.7)
Neutrophils Relative %: 55 % (ref 43–77)
PLATELETS: 80 10*3/uL — AB (ref 150–400)
RBC: 4.28 MIL/uL (ref 3.87–5.11)
RDW: 14.6 % (ref 11.5–15.5)
WBC: 7.9 10*3/uL (ref 4.0–10.5)

## 2013-08-10 NOTE — Progress Notes (Signed)
Subjective:     Patient ID: Marissa Figueroa, female   DOB: Nov 16, 1955, 58 y.o.   MRN: [id number]  HPI Here today for f/u of her end stage alcoholic liver disease. Diagnosed in 2010 when she presented with new onset of ascites. She stopped drinking for 3 years but now has started back to drinking intermittently. She tells me she has not had any alcohol since last month.  Recently seen in the ED last month. N0ed to have a bilibrubin of 7.5. She has lost 8 pounds since her last visit in May. Appetite is okay. She can't eat meats. She is eating fruit and pineapple. She usually has 2 BMs a day.  CBC    Component Value Date/Time   WBC 7.0 06/27/2013 1607   RBC 4.23 06/27/2013 1607   HGB 14.9 06/27/2013 1607   HCT 42.7 06/27/2013 1607   PLT 72* 06/27/2013 1607   MCV 100.9* 06/27/2013 1607   MCH 35.2* 06/27/2013 1607   MCHC 34.9 06/27/2013 1607   RDW 18.4* 06/27/2013 1607   LYMPHSABS 2.3 06/27/2013 1607   MONOABS 0.5 06/27/2013 1607   EOSABS 0.1 06/27/2013 1607   BASOSABS 0.0 06/27/2013 1607    +  CMP     Component Value Date/Time   NA 133* 06/27/2013 1607   NA 137 05/24/2009   K 3.1* 06/27/2013 1607   CL 88* 06/27/2013 1607   CO2 34* 06/27/2013 1607   GLUCOSE 163* 06/27/2013 1607   BUN 12 06/27/2013 1607   CREATININE 0.79 06/27/2013 1607   CREATININE 0.77 09/02/2012 1008   CALCIUM 9.1 06/27/2013 1607   PROT 7.9 06/27/2013 1607   ALBUMIN 2.5* 06/27/2013 1607   AST 158* 06/27/2013 1607   ALT 94* 06/27/2013 1607   ALKPHOS 191* 06/27/2013 1607   BILITOT 7.5* 06/27/2013 1607   GFRNONAA >90 06/27/2013 1607   GFRAA >90 06/27/2013 1607      Review of Systems Past Medical History  Diagnosis Date  . Cirrhosis     ALCOHOLIC  . Alcoholic cirrhosis of liver   . CHF (congestive heart failure)   . Shortness of breath     exertion    Past Surgical History  Procedure Laterality Date  . Tubal ligation    . Tonsilectomy, adenoidectomy, bilateral myringotomy and tubes    . Paracentesis  07/06/08  .  Paracentesis  05/25/2008    Allergies  Allergen Reactions  . Ultram [Tramadol] Other (See Comments)    Headache    Current Outpatient Prescriptions on File Prior to Visit  Medication Sig Dispense Refill  . B-Complex TABS Take 1 tablet by mouth daily.      . Calcium Carbonate-Vitamin D (CALCIUM 600+D) 600-400 MG-UNIT per tablet Take 1 tablet by mouth 2 (two) times daily.      . furosemide (LASIX) 20 MG tablet Take 20 mg by mouth daily.      [name] Kitchen. lactulose (CHRONULAC) 10 GM/15ML solution Take 15 mLs (10 g total) by mouth 2 (two) times daily.  960 mL  5  . Milk Thistle 150 MG CAPS Take 150 mg by mouth daily.      . ondansetron (ZOFRAN ODT) 4 MG disintegrating tablet Take 1 tablet (4 mg total) by mouth every 8 (eight) hours as needed for nausea or vomiting.  20 tablet  0  . pantoprazole (PROTONIX) 40 MG tablet Take 1 tablet (40 mg total) by mouth daily.  90 tablet  3  . potassium chloride SA (K-DUR,KLOR-CON) 20 MEQ tablet Take 20  mEq by mouth 2 (two) times a week.      . spironolactone (ALDACTONE) 25 MG tablet Take 50 mg by mouth daily.       No current facility-administered medications on file prior to visit.        Objective:   Physical Exam  Filed Vitals:   08/10/13 1516  BP: 126/50  Pulse: 80  Temp: 98.4 F (36.9 C)  Height: 5\' 8"  (1.727 m)  Weight: 212 lb 3.2 oz (96.253 kg)   Alert and oriented. Skin warm and dry. Oral mucosa is moist.   . Sclera icteric, conjunctivae is pink. Thyroid not enlarged. No cervical lymphadenopathy. Lungs clear. Heart regular rate and rhythm.  Abdomen is soft. Bowel sounds are positive. No hepatomegaly. No abdominal masses felt. No tenderness.  No edema to lower extremities.      Assessment:    Alcoholic cirrhosis. It remains to be seen whether she will continue not to drink.     Plan:     Cmet AFP today. OV in 4 months with DR. Rehman with a CBC and Cmet.

## 2013-08-10 NOTE — Patient Instructions (Signed)
OV in 4 months with Dr Karilyn Cotaehman with a Cmet and CBC

## 2013-08-11 ENCOUNTER — Telehealth (INDEPENDENT_AMBULATORY_CARE_PROVIDER_SITE_OTHER): Payer: Self-pay | Admitting: Internal Medicine

## 2013-08-11 ENCOUNTER — Telehealth (INDEPENDENT_AMBULATORY_CARE_PROVIDER_SITE_OTHER): Payer: Self-pay | Admitting: *Deleted

## 2013-08-11 DIAGNOSIS — K7030 Alcoholic cirrhosis of liver without ascites: Secondary | ICD-10-CM

## 2013-08-11 LAB — COMPREHENSIVE METABOLIC PANEL
ALT: 40 U/L — ABNORMAL HIGH (ref 0–35)
AST: 66 U/L — ABNORMAL HIGH (ref 0–37)
Albumin: 2.3 g/dL — ABNORMAL LOW (ref 3.5–5.2)
Alkaline Phosphatase: 164 U/L — ABNORMAL HIGH (ref 39–117)
BILIRUBIN TOTAL: 5.1 mg/dL — AB (ref 0.2–1.2)
BUN: 8 mg/dL (ref 6–23)
CHLORIDE: 93 meq/L — AB (ref 96–112)
CO2: 32 meq/L (ref 19–32)
Calcium: 8.5 mg/dL (ref 8.4–10.5)
Creat: 0.77 mg/dL (ref 0.50–1.10)
Glucose, Bld: 272 mg/dL — ABNORMAL HIGH (ref 70–99)
Potassium: 3.5 mEq/L (ref 3.5–5.3)
SODIUM: 132 meq/L — AB (ref 135–145)
TOTAL PROTEIN: 6.5 g/dL (ref 6.0–8.3)

## 2013-08-11 LAB — AFP TUMOR MARKER: AFP TUMOR MARKER: 14 ng/mL — AB (ref 0.0–8.0)

## 2013-08-11 NOTE — Telephone Encounter (Signed)
.  Per Delrae Renderri Setzer,NP in 4 months.

## 2013-08-11 NOTE — Telephone Encounter (Signed)
Message left at home.  AFP: climbing. Needs US RUQ.

## 2013-08-11 NOTE — Progress Notes (Signed)
US sch'd 08/18/13 @ 930 (915), npo after midnight, detailed message left on voice mail

## 2013-08-18 ENCOUNTER — Ambulatory Visit (HOSPITAL_COMMUNITY): Payer: Medicaid Other

## 2013-08-20 ENCOUNTER — Ambulatory Visit (HOSPITAL_COMMUNITY)
Admission: RE | Admit: 2013-08-20 | Discharge: 2013-08-20 | Disposition: A | Discharge status: Home / Self Care | Payer: Medicaid Other | Source: Ambulatory Visit | Attending: Internal Medicine | Admitting: Internal Medicine

## 2013-08-20 DIAGNOSIS — K7030 Alcoholic cirrhosis of liver without ascites: Secondary | ICD-10-CM | POA: Insufficient documentation

## 2013-10-06 ENCOUNTER — Other Ambulatory Visit (INDEPENDENT_AMBULATORY_CARE_PROVIDER_SITE_OTHER): Payer: Self-pay | Admitting: Internal Medicine

## 2013-10-06 NOTE — Telephone Encounter (Signed)
May fill with no refills

## 2013-10-06 NOTE — Telephone Encounter (Signed)
Per Dr.Rehman may fill with 2 refills 

## 2013-11-03 ENCOUNTER — Other Ambulatory Visit (INDEPENDENT_AMBULATORY_CARE_PROVIDER_SITE_OTHER): Payer: Self-pay | Admitting: *Deleted

## 2013-11-03 ENCOUNTER — Encounter (INDEPENDENT_AMBULATORY_CARE_PROVIDER_SITE_OTHER): Payer: Self-pay | Admitting: *Deleted

## 2013-11-03 DIAGNOSIS — K7030 Alcoholic cirrhosis of liver without ascites: Secondary | ICD-10-CM

## 2013-12-13 ENCOUNTER — Telehealth (INDEPENDENT_AMBULATORY_CARE_PROVIDER_SITE_OTHER): Payer: Self-pay | Admitting: *Deleted

## 2013-12-13 NOTE — Telephone Encounter (Signed)
She calls in this morning at 6:59 am  Please call her she has a problem that we don't need and needs to discuss her appt tomorrow

## 2013-12-13 NOTE — Telephone Encounter (Signed)
Apt has been cx due to having head lice.

## 2013-12-14 ENCOUNTER — Ambulatory Visit (INDEPENDENT_AMBULATORY_CARE_PROVIDER_SITE_OTHER): Payer: Medicaid Other | Admitting: Internal Medicine

## 2013-12-21 ENCOUNTER — Encounter (INDEPENDENT_AMBULATORY_CARE_PROVIDER_SITE_OTHER): Payer: Self-pay | Admitting: Internal Medicine

## 2013-12-21 ENCOUNTER — Ambulatory Visit (INDEPENDENT_AMBULATORY_CARE_PROVIDER_SITE_OTHER): Payer: Medicaid Other | Admitting: Internal Medicine

## 2013-12-21 VITALS — BP 112/70 | HR 82 | Temp 98.1°F | Resp 18 | Ht 68.0 in | Wt 222.3 lb

## 2013-12-21 DIAGNOSIS — K219 Gastro-esophageal reflux disease without esophagitis: Secondary | ICD-10-CM

## 2013-12-21 DIAGNOSIS — R600 Localized edema: Secondary | ICD-10-CM

## 2013-12-21 DIAGNOSIS — K7030 Alcoholic cirrhosis of liver without ascites: Secondary | ICD-10-CM

## 2013-12-21 DIAGNOSIS — R609 Edema, unspecified: Secondary | ICD-10-CM

## 2013-12-21 DIAGNOSIS — K7010 Alcoholic hepatitis without ascites: Secondary | ICD-10-CM

## 2013-12-21 MED ORDER — PANTOPRAZOLE SODIUM 40 MG PO TBEC: 40.0000 mg | DELAYED_RELEASE_TABLET | Freq: Every day | ORAL | Status: DC | PRN

## 2013-12-21 MED ORDER — FUROSEMIDE 20 MG PO TABS: 20.0000 mg | ORAL_TABLET | Freq: Every day | ORAL | Status: DC | PRN

## 2013-12-21 NOTE — Progress Notes (Signed)
Presenting complaint;  Followup for alcoholic liver disease.  Subjective:  Patient is 58 year old Caucasian female with history of alcoholic cirrhosis complicated by ascites and thrombocytopenia. She was initially evaluated or 5 years ago and her ascites resolved with abstinence. She decided to go back to drinking last year when she developed alcoholic hepatitis. Patient was last seen on 08/10/2013. She says she has not had any alcohol since that visit. She tells me she is also almost quit cigarette smoking. She smokes no more than one cigarette per day. She denies abdominal pain nausea or vomiting. She also denies melena or rectal bleeding. She has intermittent constipation and using lactulose on an as-needed basis. She has gained 10 pounds in the last 4 months. She says lower extremity edema has decreased. She is interested in having EGD and a colonoscopy and just wants to be heavily sedated.         Current Medications: Outpatient Encounter Prescriptions as of 12/21/2013  Medication Sig  . alprazolam (XANAX) 2 MG tablet Take 2 mg by mouth at bedtime as needed for sleep. Patient states that she takes 1/2 half of the 2 mg as needed.  [name] Kitchen B-Complex TABS Take 1 tablet by mouth daily.  . Calcium Carbonate-Vitamin D (CALCIUM 600+D) 600-400 MG-UNIT per tablet Take 1 tablet by mouth 2 (two) times daily.  . furosemide (LASIX) 20 MG tablet TAKE ONE TABLET BY MOUTH ONCE DAILY  . lactulose (CHRONULAC) 10 GM/15ML solution Take 15 mLs (10 g total) by mouth 2 (two) times daily.  . Milk Thistle 150 MG CAPS Take 150 mg by mouth daily.  [name] Kitchen OVER THE COUNTER MEDICATION Dandelion Root -  Patient states that she takes 1 in the morning , 1 mid day , and 1 at night. This is to help with swelling.  . potassium chloride SA (K-DUR,KLOR-CON) 20 MEQ tablet Take 20 mEq by mouth 2 (two) times a week.  . spironolactone (ALDACTONE) 25 MG tablet Take 50 mg by mouth daily.  . pantoprazole (PROTONIX) 40 MG tablet Take 1  tablet (40 mg total) by mouth daily.  . [DISCONTINUED] furosemide (LASIX) 20 MG tablet Take 20 mg by mouth daily.  . [DISCONTINUED] KLOR-CON M10 10 MEQ tablet TAKE ONE TABLET BY MOUTH ONCE DAILY  . [DISCONTINUED] ondansetron (ZOFRAN ODT) 4 MG disintegrating tablet Take 1 tablet (4 mg total) by mouth every 8 (eight) hours as needed for nausea or vomiting.     Objective: Blood pressure 112/70, pulse 82, temperature 98.1 F (36.7 C), temperature source Oral, resp. rate 18, height  (1.727 m), weight 222 lb 4.8 oz (100.835 kg). Patient is alert and in no acute distress. Asterixis absent. Conjunctiva is pink. Sclera is nonicteric Oropharyngeal mucosa is normal. No neck masses or thyromegaly noted. Cardiac exam with regular rhythm normal S1 and S2. No murmur or gallop noted. Lungs are clear to auscultation. Abdomen is full. Flanks are not bulging. Abdomen is soft with easily palpable from the liver with prominent left lobe. Spleen is not palpable. Shifting dullness is absent. She has trace to 1+ pitting edema around ankles.    Assessment:  #1. Alcoholic hepatitis. Transaminases were elevated back in April 2015. Since she is not drinking alcohol transaminases should be back to normal. #2. Alcoholic cirrhosis. Clinically she does not have ascites which she has history of. She does not need to be on Spironolactone. #3. Thrombocytopenia secondary to cirrhosis and splenomegaly. #4. Lower extremity edema. She has mild edema around ankles. #5. GERD. Symptoms well-controlled with  therapy.  Plan:  Patient will go to lab for CBC, comprehensive chemistry panel and AFP. Upper abdominal ultrasound. Discontinue Spirionolactone. Use furosemide and KCl on an as-needed basis. Will schedule patient for EGD and colonoscopy with MAC when she is ready(patient was scheduled for these procedures last year but she canceled twice). Office visit in 6 months.

## 2013-12-21 NOTE — Patient Instructions (Signed)
Physician will call with results of blood work when completed 

## 2014-05-25 ENCOUNTER — Telehealth (INDEPENDENT_AMBULATORY_CARE_PROVIDER_SITE_OTHER): Payer: Self-pay | Admitting: *Deleted

## 2014-05-25 NOTE — Telephone Encounter (Signed)
Marissa CanesChristine said she lost her lab orders, so she never had them done. She has mover her apt out to April 5 and would like to get the labs done on 07/14/14. She has been sick with the flu then her eye messed up. Please send new lab orders up. Her return phone number is [phone].

## 2014-05-26 NOTE — Telephone Encounter (Signed)
The lab orders that she was to have done after the 12-2013 visit have been [facility].

## 2014-06-02 ENCOUNTER — Other Ambulatory Visit (INDEPENDENT_AMBULATORY_CARE_PROVIDER_SITE_OTHER): Payer: Self-pay | Admitting: Internal Medicine

## 2014-06-02 MED ORDER — POTASSIUM CHLORIDE ER 8 MEQ PO TBCR: 20.0000 meq | EXTENDED_RELEASE_TABLET | ORAL | Status: DC | PRN

## 2014-06-21 ENCOUNTER — Ambulatory Visit (INDEPENDENT_AMBULATORY_CARE_PROVIDER_SITE_OTHER): Payer: Medicaid Other | Admitting: Internal Medicine

## 2014-07-18 ENCOUNTER — Other Ambulatory Visit (INDEPENDENT_AMBULATORY_CARE_PROVIDER_SITE_OTHER): Payer: Self-pay | Admitting: Internal Medicine

## 2014-07-19 ENCOUNTER — Ambulatory Visit (INDEPENDENT_AMBULATORY_CARE_PROVIDER_SITE_OTHER): Payer: Medicare Other | Admitting: Internal Medicine

## 2014-07-19 ENCOUNTER — Encounter (INDEPENDENT_AMBULATORY_CARE_PROVIDER_SITE_OTHER): Payer: Self-pay | Admitting: Internal Medicine

## 2014-07-19 VITALS — BP 130/80 | HR 86 | Temp 98.2°F | Resp 18 | Ht 68.0 in | Wt 218.8 lb

## 2014-07-19 DIAGNOSIS — F411 Generalized anxiety disorder: Secondary | ICD-10-CM

## 2014-07-19 DIAGNOSIS — K7010 Alcoholic hepatitis without ascites: Secondary | ICD-10-CM

## 2014-07-19 DIAGNOSIS — G4700 Insomnia, unspecified: Secondary | ICD-10-CM | POA: Diagnosis not present

## 2014-07-19 DIAGNOSIS — K7030 Alcoholic cirrhosis of liver without ascites: Secondary | ICD-10-CM | POA: Diagnosis not present

## 2014-07-19 LAB — COMPREHENSIVE METABOLIC PANEL
ALT: 43 U/L — ABNORMAL HIGH (ref 0–35)
AST: 110 U/L — AB (ref 0–37)
Albumin: 2.1 g/dL — ABNORMAL LOW (ref 3.5–5.2)
Alkaline Phosphatase: 145 U/L — ABNORMAL HIGH (ref 39–117)
BILIRUBIN TOTAL: 11 mg/dL — AB (ref 0.2–1.2)
BUN: 8 mg/dL (ref 6–23)
CALCIUM: 8.3 mg/dL — AB (ref 8.4–10.5)
CHLORIDE: 96 meq/L (ref 96–112)
CO2: 29 mEq/L (ref 19–32)
CREATININE: 0.87 mg/dL (ref 0.50–1.10)
Glucose, Bld: 97 mg/dL (ref 70–99)
Potassium: 3.4 mEq/L — ABNORMAL LOW (ref 3.5–5.3)
Sodium: 136 mEq/L (ref 135–145)
Total Protein: 6.9 g/dL (ref 6.0–8.3)

## 2014-07-19 LAB — CBC
HCT: 33.2 % — ABNORMAL LOW (ref 36.0–46.0)
Hemoglobin: 11.7 g/dL — ABNORMAL LOW (ref 12.0–15.0)
MCH: 33.2 pg (ref 26.0–34.0)
MCHC: 35.2 g/dL (ref 30.0–36.0)
MCV: 94.3 fL (ref 78.0–100.0)
MPV: 10.9 fL (ref 8.6–12.4)
PLATELETS: 43 10*3/uL — AB (ref 150–400)
RBC: 3.52 MIL/uL — AB (ref 3.87–5.11)
RDW: 14.9 % (ref 11.5–15.5)
WBC: 6.6 10*3/uL (ref 4.0–10.5)

## 2014-07-19 LAB — AFP TUMOR MARKER: AFP-Tumor Marker: 4.2 ng/mL (ref <6.1)

## 2014-07-19 MED ORDER — LORAZEPAM 0.5 MG PO TABS: 0.5000 mg | ORAL_TABLET | Freq: Three times a day (TID) | ORAL | Status: DC

## 2014-07-19 NOTE — Patient Instructions (Signed)
Do not take Tylenol or any other over-the-counter medications. Patient will call with results of blood work and ultrasound when completed

## 2014-07-19 NOTE — Progress Notes (Signed)
Presenting complaint;  Follow-up for alcoholic liver disease.  Subjective:  Patient is 59 year old Caucasian female who has alcoholic cirrhosis which was diagnosed about 7 years ago when she presented with new onset of ascites. She did well for a few years since she quit drinking alcohol but she is gone back to drinking again. She was last seen in September 2015. She is here with her daughter [name]. She does not feel well. She states she has not had any alcohol in 1 month. She complains of bilateral knee pain which she's had for 3 years. She also has intermittent epistaxis. She denies abdominal pain melena or rectal bleeding diarrhea or constipation. She also denies nausea vomiting or hematemesis. She states her appetite is normal. She also denies fever or chills.   Current Medications: Outpatient Encounter Prescriptions as of 07/19/2014  Medication Sig  . B-Complex TABS Take 1 tablet by mouth daily.  . Calcium Carbonate-Vitamin D (CALCIUM 600+D) 600-400 MG-UNIT per tablet Take 1 tablet by mouth 2 (two) times daily.  . furosemide (LASIX) 20 MG tablet Take 1 tablet (20 mg total) by mouth daily as needed. (Patient taking differently: Take 20 mg by mouth daily. )  . lactulose (CHRONULAC) 10 GM/15ML solution Take 15 mLs (10 g total) by mouth 2 (two) times daily.  . Milk Thistle 150 MG CAPS Take 150 mg by mouth daily.  [name] Kitchen. OVER THE COUNTER MEDICATION Dandelion Root -  Patient states that she takes 1 in the morning , 1 mid day , and 1 at night. This is to help with swelling.  . pantoprazole (PROTONIX) 40 MG tablet Take 1 tablet (40 mg total) by mouth daily as needed.  . potassium chloride SA (K-DUR,KLOR-CON) 20 MEQ tablet Take 20 mEq by mouth. Patient states that she takes 3 times per week.  [name] Kitchen. alprazolam (XANAX) 2 MG tablet Take 2 mg by mouth at bedtime as needed for sleep. Patient states that she takes 1/2 half of the 2 mg as needed.  . [DISCONTINUED] potassium chloride (KLOR-CON) 8 MEQ tablet Take  2.5 tablets (20 mEq total) by mouth as needed. (Patient not taking: Reported on 07/19/2014)     Objective: Blood pressure 130/80, pulse 86, temperature 98.2 F (36.8 C), temperature source Oral, resp. rate 18, height 5\' 8"  (1.727 m), weight 218 lb 12.8 oz (99.247 kg). Patient is alert and in no acute distress. She is jaundiced and does not have asterixis Conjunctiva is pink. Sclera is icteric Oropharyngeal mucosa is normal. No neck masses or thyromegaly noted. Cardiac exam with regular rhythm normal S1 and S2. No murmur or gallop noted. Lungs are clear to auscultation. Abdomen is full. On palpation is soft with mildly tender hepatomegaly. Pain is not palpable. 1+ pitting edema around ankles.  Labs/studies Results:   Recent Labs  07/18/14 1601  WBC 6.6  HGB 11.7*  HCT 33.2*  PLT 43*    BMET   Recent Labs  07/18/14 1601  NA 136  K 3.4*  CL 96  CO2 29  GLUCOSE 97  BUN 8  CREATININE 0.87  CALCIUM 8.3*    LFT   Recent Labs  07/18/14 1601  PROT 6.9  ALBUMIN 2.1*  AST 110*  ALT 43*  ALKPHOS 145*  BILITOT 11.0*     Assessment:  #1. Jaundice. Etiology felt to be intrahepatic cholestasis secondary to alcoholic hepatitis on top of alcoholic cirrhosis. Need to rule out extrahepatic obstruction. Her prognosis is very poor unless she quits drinking. #2. Thrombocytopenia secondary to cirrhosis  and splenomegaly. #3.  Anxiety.   Plan:  Patient advised not to take Tylenol or other OTC NSAIDs. Lorazepam 0.5 mg three times a day when necessary. Prescription given for 42 doses without refill. Upper abdominal ultrasound. She will go to the lab for INR so that we could calculate hepatic discriminant function and determine if she should be treated with prednisone. Patient advised to consider inpatient rehabilitation. Office visit in 2 weeks.

## 2014-07-27 ENCOUNTER — Ambulatory Visit (HOSPITAL_COMMUNITY): Admission: RE | Admit: 2014-07-27 | Payer: Medicare Other | Source: Ambulatory Visit

## 2014-07-27 ENCOUNTER — Telehealth (INDEPENDENT_AMBULATORY_CARE_PROVIDER_SITE_OTHER): Payer: Self-pay | Admitting: *Deleted

## 2014-07-27 NOTE — Telephone Encounter (Signed)
Noted  

## 2014-07-27 NOTE — Telephone Encounter (Signed)
FYI: Per radiology patient didn't show for US today

## 2014-08-02 ENCOUNTER — Ambulatory Visit (HOSPITAL_COMMUNITY): Admission: RE | Admit: 2014-08-02 | Payer: Medicare Other | Source: Ambulatory Visit

## 2014-08-04 ENCOUNTER — Telehealth (INDEPENDENT_AMBULATORY_CARE_PROVIDER_SITE_OTHER): Payer: Self-pay | Admitting: *Deleted

## 2014-08-04 ENCOUNTER — Ambulatory Visit (HOSPITAL_COMMUNITY): Admission: RE | Admit: 2014-08-04 | Payer: Medicare Other | Source: Ambulatory Visit

## 2014-08-04 NOTE — Telephone Encounter (Signed)
Marissa Figueroa called and said to let Dr. Karilyn Cotaehman know she is ready for Rehab. Would like to be with her children for Mother's Day then go. She also Cx her US for 3rd time. Her return phone number is [phone].

## 2014-08-04 NOTE — Telephone Encounter (Signed)
Dr.Rehman do we do a referral or does PCP ? I am not sure that she has a PCP.

## 2014-08-04 NOTE — Telephone Encounter (Signed)
At the time of the patient's last OV it was mentioned about inpatient rehabilitation. Will discuss with Dr.Rehman.

## 2014-08-06 NOTE — Telephone Encounter (Signed)
We can make referral for rehab.

## 2014-08-08 ENCOUNTER — Encounter (HOSPITAL_COMMUNITY): Payer: Self-pay | Admitting: Cardiology

## 2014-08-08 ENCOUNTER — Emergency Department (HOSPITAL_COMMUNITY)
Admission: EM | Admit: 2014-08-08 | Discharge: 2014-08-08 | Discharge status: Against Medical Advice | Payer: Medicare Other | Source: Home / Self Care

## 2014-08-08 ENCOUNTER — Telehealth (INDEPENDENT_AMBULATORY_CARE_PROVIDER_SITE_OTHER): Payer: Self-pay | Admitting: *Deleted

## 2014-08-08 DIAGNOSIS — Z720 Tobacco use: Secondary | ICD-10-CM | POA: Insufficient documentation

## 2014-08-08 DIAGNOSIS — K766 Portal hypertension: Secondary | ICD-10-CM | POA: Diagnosis present

## 2014-08-08 DIAGNOSIS — D684 Acquired coagulation factor deficiency: Secondary | ICD-10-CM | POA: Diagnosis present

## 2014-08-08 DIAGNOSIS — K226 Gastro-esophageal laceration-hemorrhage syndrome: Principal | ICD-10-CM | POA: Diagnosis present

## 2014-08-08 DIAGNOSIS — K7011 Alcoholic hepatitis with ascites: Secondary | ICD-10-CM | POA: Diagnosis present

## 2014-08-08 DIAGNOSIS — E871 Hypo-osmolality and hyponatremia: Secondary | ICD-10-CM | POA: Diagnosis present

## 2014-08-08 DIAGNOSIS — B9620 Unspecified Escherichia coli [E. coli] as the cause of diseases classified elsewhere: Secondary | ICD-10-CM | POA: Diagnosis present

## 2014-08-08 DIAGNOSIS — R1900 Intra-abdominal and pelvic swelling, mass and lump, unspecified site: Secondary | ICD-10-CM | POA: Insufficient documentation

## 2014-08-08 DIAGNOSIS — K7290 Hepatic failure, unspecified without coma: Secondary | ICD-10-CM | POA: Diagnosis present

## 2014-08-08 DIAGNOSIS — K3189 Other diseases of stomach and duodenum: Secondary | ICD-10-CM | POA: Diagnosis present

## 2014-08-08 DIAGNOSIS — N390 Urinary tract infection, site not specified: Secondary | ICD-10-CM | POA: Diagnosis present

## 2014-08-08 DIAGNOSIS — E261 Secondary hyperaldosteronism: Secondary | ICD-10-CM | POA: Diagnosis present

## 2014-08-08 DIAGNOSIS — I509 Heart failure, unspecified: Secondary | ICD-10-CM | POA: Diagnosis present

## 2014-08-08 DIAGNOSIS — R1110 Vomiting, unspecified: Secondary | ICD-10-CM

## 2014-08-08 DIAGNOSIS — D539 Nutritional anemia, unspecified: Secondary | ICD-10-CM | POA: Diagnosis present

## 2014-08-08 DIAGNOSIS — D62 Acute posthemorrhagic anemia: Secondary | ICD-10-CM | POA: Diagnosis present

## 2014-08-08 DIAGNOSIS — F1010 Alcohol abuse, uncomplicated: Secondary | ICD-10-CM | POA: Diagnosis present

## 2014-08-08 DIAGNOSIS — K920 Hematemesis: Secondary | ICD-10-CM | POA: Diagnosis not present

## 2014-08-08 DIAGNOSIS — F17210 Nicotine dependence, cigarettes, uncomplicated: Secondary | ICD-10-CM | POA: Diagnosis present

## 2014-08-08 DIAGNOSIS — K7031 Alcoholic cirrhosis of liver with ascites: Secondary | ICD-10-CM | POA: Diagnosis present

## 2014-08-08 DIAGNOSIS — D6959 Other secondary thrombocytopenia: Secondary | ICD-10-CM | POA: Diagnosis present

## 2014-08-08 NOTE — Telephone Encounter (Signed)
[facility] regards to patient being set up for rehabilitation, both places request patient to call them directly to get the process going. Ms Marissa Figueroa is currently admitted to [facility]

## 2014-08-08 NOTE — Telephone Encounter (Signed)
Marissa Figueroa isWynona Figueroa vomiting up red blood, diarrhea is sold black and face is swollen. The return phone number is [phone].

## 2014-08-08 NOTE — ED Notes (Signed)
History of alcohol abuse.  Started back drinking one month ago.  Last drink Friday.  C/o bloody bm's since Saturday. Abdominal swelling.  Vomiting since Saturday.

## 2014-08-08 NOTE — Telephone Encounter (Signed)
Dr.Rehman was made aware of patient's call. Per Dr.Rehman the patient should go to ED for evaluation. Patient called and made aware , she states as soon as daughter gets there [facility] will [facility].

## 2014-08-08 NOTE — ED Notes (Signed)
Pt left without being seen after Triage 

## 2014-08-09 ENCOUNTER — Emergency Department (HOSPITAL_COMMUNITY): Payer: Medicare Other

## 2014-08-09 ENCOUNTER — Inpatient Hospital Stay (HOSPITAL_COMMUNITY)
Admission: EM | Admit: 2014-08-09 | Discharge: 2014-08-18 | DRG: 369 | Disposition: A | Discharge status: Skilled Nursing Facility | Payer: Medicare Other | Attending: Internal Medicine | Admitting: Internal Medicine

## 2014-08-09 DIAGNOSIS — R0902 Hypoxemia: Secondary | ICD-10-CM | POA: Diagnosis not present

## 2014-08-09 DIAGNOSIS — D696 Thrombocytopenia, unspecified: Secondary | ICD-10-CM | POA: Diagnosis not present

## 2014-08-09 DIAGNOSIS — F1010 Alcohol abuse, uncomplicated: Secondary | ICD-10-CM

## 2014-08-09 DIAGNOSIS — S6991XA Unspecified injury of right wrist, hand and finger(s), initial encounter: Secondary | ICD-10-CM | POA: Diagnosis not present

## 2014-08-09 DIAGNOSIS — R188 Other ascites: Secondary | ICD-10-CM

## 2014-08-09 DIAGNOSIS — R109 Unspecified abdominal pain: Secondary | ICD-10-CM

## 2014-08-09 DIAGNOSIS — R110 Nausea: Secondary | ICD-10-CM | POA: Diagnosis not present

## 2014-08-09 DIAGNOSIS — K920 Hematemesis: Secondary | ICD-10-CM | POA: Diagnosis present

## 2014-08-09 DIAGNOSIS — D539 Nutritional anemia, unspecified: Secondary | ICD-10-CM | POA: Diagnosis present

## 2014-08-09 DIAGNOSIS — K7011 Alcoholic hepatitis with ascites: Secondary | ICD-10-CM | POA: Diagnosis present

## 2014-08-09 DIAGNOSIS — K226 Gastro-esophageal laceration-hemorrhage syndrome: Secondary | ICD-10-CM | POA: Diagnosis present

## 2014-08-09 DIAGNOSIS — K3189 Other diseases of stomach and duodenum: Secondary | ICD-10-CM | POA: Diagnosis present

## 2014-08-09 DIAGNOSIS — K7031 Alcoholic cirrhosis of liver with ascites: Secondary | ICD-10-CM | POA: Diagnosis not present

## 2014-08-09 DIAGNOSIS — J441 Chronic obstructive pulmonary disease with (acute) exacerbation: Secondary | ICD-10-CM

## 2014-08-09 DIAGNOSIS — D62 Acute posthemorrhagic anemia: Secondary | ICD-10-CM | POA: Diagnosis not present

## 2014-08-09 DIAGNOSIS — N390 Urinary tract infection, site not specified: Secondary | ICD-10-CM | POA: Diagnosis present

## 2014-08-09 DIAGNOSIS — F17210 Nicotine dependence, cigarettes, uncomplicated: Secondary | ICD-10-CM | POA: Diagnosis present

## 2014-08-09 DIAGNOSIS — W19XXXA Unspecified fall, initial encounter: Secondary | ICD-10-CM | POA: Diagnosis not present

## 2014-08-09 DIAGNOSIS — S59901A Unspecified injury of right elbow, initial encounter: Secondary | ICD-10-CM | POA: Diagnosis present

## 2014-08-09 DIAGNOSIS — K7290 Hepatic failure, unspecified without coma: Secondary | ICD-10-CM | POA: Diagnosis present

## 2014-08-09 DIAGNOSIS — I509 Heart failure, unspecified: Secondary | ICD-10-CM | POA: Diagnosis present

## 2014-08-09 DIAGNOSIS — E871 Hypo-osmolality and hyponatremia: Secondary | ICD-10-CM | POA: Diagnosis present

## 2014-08-09 DIAGNOSIS — M549 Dorsalgia, unspecified: Secondary | ICD-10-CM

## 2014-08-09 DIAGNOSIS — D684 Acquired coagulation factor deficiency: Secondary | ICD-10-CM | POA: Diagnosis present

## 2014-08-09 DIAGNOSIS — E261 Secondary hyperaldosteronism: Secondary | ICD-10-CM | POA: Diagnosis present

## 2014-08-09 DIAGNOSIS — F411 Generalized anxiety disorder: Secondary | ICD-10-CM

## 2014-08-09 DIAGNOSIS — D6959 Other secondary thrombocytopenia: Secondary | ICD-10-CM | POA: Diagnosis present

## 2014-08-09 DIAGNOSIS — B9620 Unspecified Escherichia coli [E. coli] as the cause of diseases classified elsewhere: Secondary | ICD-10-CM | POA: Diagnosis present

## 2014-08-09 DIAGNOSIS — K766 Portal hypertension: Secondary | ICD-10-CM | POA: Diagnosis present

## 2014-08-09 LAB — URINALYSIS, ROUTINE W REFLEX MICROSCOPIC
Glucose, UA: 100 mg/dL — AB
Nitrite: POSITIVE — AB
PH: 5.5 (ref 5.0–8.0)
Specific Gravity, Urine: 1.015 (ref 1.005–1.030)
Urobilinogen, UA: 4 mg/dL — ABNORMAL HIGH (ref 0.0–1.0)

## 2014-08-09 LAB — URINE MICROSCOPIC-ADD ON

## 2014-08-09 LAB — CBC WITH DIFFERENTIAL/PLATELET
Basophils Absolute: 0 10*3/uL (ref 0.0–0.1)
Basophils Relative: 1 % (ref 0–1)
EOS PCT: 3 % (ref 0–5)
Eosinophils Absolute: 0.2 10*3/uL (ref 0.0–0.7)
HCT: 29.8 % — ABNORMAL LOW (ref 36.0–46.0)
HEMOGLOBIN: 9.6 g/dL — AB (ref 12.0–15.0)
Lymphocytes Relative: 22 % (ref 12–46)
Lymphs Abs: 1.4 10*3/uL (ref 0.7–4.0)
MCH: 33.8 pg (ref 26.0–34.0)
MCHC: 32.2 g/dL (ref 30.0–36.0)
MCV: 104.9 fL — AB (ref 78.0–100.0)
MONO ABS: 0.5 10*3/uL (ref 0.1–1.0)
Monocytes Relative: 7 % (ref 3–12)
Neutro Abs: 4.4 10*3/uL (ref 1.7–7.7)
Neutrophils Relative %: 68 % (ref 43–77)
PLATELETS: 48 10*3/uL — AB (ref 150–400)
RBC: 2.84 MIL/uL — ABNORMAL LOW (ref 3.87–5.11)
RDW: 16.3 % — ABNORMAL HIGH (ref 11.5–15.5)
Smear Review: DECREASED
WBC: 6.5 10*3/uL (ref 4.0–10.5)

## 2014-08-09 LAB — TYPE AND SCREEN
ABO/RH(D): O POS
Antibody Screen: NEGATIVE

## 2014-08-09 LAB — BRAIN NATRIURETIC PEPTIDE: B Natriuretic Peptide: 178 pg/mL — ABNORMAL HIGH (ref 0.0–100.0)

## 2014-08-09 LAB — COMPREHENSIVE METABOLIC PANEL
ALBUMIN: 1.6 g/dL — AB (ref 3.5–5.2)
ALT: 35 U/L (ref 0–35)
ANION GAP: 7 (ref 5–15)
AST: 91 U/L — ABNORMAL HIGH (ref 0–37)
Alkaline Phosphatase: 133 U/L — ABNORMAL HIGH (ref 39–117)
BUN: 8 mg/dL (ref 6–23)
CHLORIDE: 94 mmol/L — AB (ref 96–112)
CO2: 30 mmol/L (ref 19–32)
CREATININE: 0.68 mg/dL (ref 0.50–1.10)
Calcium: 8.1 mg/dL — ABNORMAL LOW (ref 8.4–10.5)
GFR calc Af Amer: >90 mL/min (ref >90)
GFR calc non Af Amer: >90 mL/min (ref >90)
Glucose, Bld: 220 mg/dL — ABNORMAL HIGH (ref 70–99)
Potassium: 3.1 mmol/L — ABNORMAL LOW (ref 3.5–5.1)
SODIUM: 131 mmol/L — AB (ref 135–145)
Total Bilirubin: 10.1 mg/dL — ABNORMAL HIGH (ref 0.3–1.2)
Total Protein: 6.2 g/dL (ref 6.0–8.3)

## 2014-08-09 LAB — PROTIME-INR
INR: 3.36 — AB (ref 0.00–1.49)
Prothrombin Time: 34.3 seconds — ABNORMAL HIGH (ref 11.6–15.2)

## 2014-08-09 LAB — POC OCCULT BLOOD, ED: Fecal Occult Bld: NEGATIVE

## 2014-08-09 LAB — LIPASE, BLOOD: Lipase: 69 U/L — ABNORMAL HIGH (ref 11–59)

## 2014-08-09 LAB — APTT: APTT: 51 s — AB (ref 24–37)

## 2014-08-09 LAB — ETHANOL: Alcohol, Ethyl (B): <5 mg/dL (ref 0–9)

## 2014-08-09 MED ORDER — FUROSEMIDE 20 MG PO TABS
20.0000 mg | ORAL_TABLET | Freq: Every day | ORAL | Status: DC
  Administered 2014-08-10: 20 mg via ORAL
  Filled 2014-08-09: qty 1

## 2014-08-09 MED ORDER — SODIUM CHLORIDE 0.9 % IJ SOLN
3.0000 mL | Freq: Two times a day (BID) | INTRAMUSCULAR | Status: DC
  Administered 2014-08-10 – 2014-08-18 (×7): 3 mL via INTRAVENOUS

## 2014-08-09 MED ORDER — ONDANSETRON HCL 4 MG/2ML IJ SOLN: 4.0000 mg | Freq: Four times a day (QID) | INTRAMUSCULAR | Status: DC | PRN

## 2014-08-09 MED ORDER — LACTULOSE 10 GM/15ML PO SOLN
10.0000 g | Freq: Two times a day (BID) | ORAL | Status: DC
  Administered 2014-08-09 – 2014-08-18 (×18): 10 g via ORAL
  Filled 2014-08-09 (×18): qty 30

## 2014-08-09 MED ORDER — POTASSIUM CHLORIDE CRYS ER 20 MEQ PO TBCR
40.0000 meq | EXTENDED_RELEASE_TABLET | Freq: Once | ORAL | Status: AC
  Administered 2014-08-09: 40 meq via ORAL
  Filled 2014-08-09: qty 2

## 2014-08-09 MED ORDER — FUROSEMIDE 20 MG PO TABS: 20.0000 mg | ORAL_TABLET | Freq: Every day | ORAL | Status: DC

## 2014-08-09 MED ORDER — MILK THISTLE 150 MG PO CAPS: 150.0000 mg | ORAL_CAPSULE | Freq: Every day | ORAL | Status: DC

## 2014-08-09 MED ORDER — DEXTROSE 5 % IV SOLN: 1.0000 g | INTRAVENOUS | Status: DC

## 2014-08-09 MED ORDER — IPRATROPIUM-ALBUTEROL 0.5-2.5 (3) MG/3ML IN SOLN
3.0000 mL | Freq: Three times a day (TID) | RESPIRATORY_TRACT | Status: DC
  Administered 2014-08-10 (×2): 3 mL via RESPIRATORY_TRACT
  Filled 2014-08-09 (×2): qty 3

## 2014-08-09 MED ORDER — CEFTRIAXONE SODIUM IN DEXTROSE 20 MG/ML IV SOLN
1.0000 g | INTRAVENOUS | Status: DC
  Filled 2014-08-09: qty 50

## 2014-08-09 MED ORDER — METHYLPREDNISOLONE SODIUM SUCC 125 MG IJ SOLR
125.0000 mg | Freq: Once | INTRAMUSCULAR | Status: AC
  Administered 2014-08-09: 125 mg via INTRAVENOUS
  Filled 2014-08-09: qty 2

## 2014-08-09 MED ORDER — IPRATROPIUM-ALBUTEROL 0.5-2.5 (3) MG/3ML IN SOLN
3.0000 mL | RESPIRATORY_TRACT | Status: DC
  Administered 2014-08-09: 3 mL via RESPIRATORY_TRACT
  Filled 2014-08-09: qty 3

## 2014-08-09 MED ORDER — CEFTRIAXONE SODIUM 1 G IJ SOLR
1.0000 g | Freq: Once | INTRAMUSCULAR | Status: AC
  Administered 2014-08-09: 1 g via INTRAVENOUS
  Filled 2014-08-09: qty 10

## 2014-08-09 MED ORDER — SPIRONOLACTONE 25 MG PO TABS
50.0000 mg | ORAL_TABLET | Freq: Every day | ORAL | Status: DC
  Administered 2014-08-10 – 2014-08-12 (×3): 50 mg via ORAL
  Filled 2014-08-09: qty 1
  Filled 2014-08-09: qty 2
  Filled 2014-08-09: qty 1
  Filled 2014-08-09 (×2): qty 2

## 2014-08-09 MED ORDER — IPRATROPIUM-ALBUTEROL 0.5-2.5 (3) MG/3ML IN SOLN
3.0000 mL | Freq: Once | RESPIRATORY_TRACT | Status: AC
  Administered 2014-08-09: 3 mL via RESPIRATORY_TRACT
  Filled 2014-08-09: qty 3

## 2014-08-09 MED ORDER — ONDANSETRON HCL 4 MG PO TABS
4.0000 mg | ORAL_TABLET | Freq: Four times a day (QID) | ORAL | Status: DC | PRN
  Administered 2014-08-18: 4 mg via ORAL
  Filled 2014-08-09: qty 1

## 2014-08-09 MED ORDER — LORAZEPAM 0.5 MG PO TABS
0.5000 mg | ORAL_TABLET | Freq: Three times a day (TID) | ORAL | Status: DC
  Administered 2014-08-09 – 2014-08-18 (×17): 0.5 mg via ORAL
  Filled 2014-08-09 (×21): qty 1

## 2014-08-09 MED ORDER — IPRATROPIUM-ALBUTEROL 0.5-2.5 (3) MG/3ML IN SOLN: 3.0000 mL | Freq: Once | RESPIRATORY_TRACT | Status: DC

## 2014-08-09 MED ORDER — CALCIUM CARBONATE-VITAMIN D 500-200 MG-UNIT PO TABS
1.0000 | ORAL_TABLET | Freq: Two times a day (BID) | ORAL | Status: DC
  Administered 2014-08-09 – 2014-08-18 (×18): 1 via ORAL
  Filled 2014-08-09 (×18): qty 1

## 2014-08-09 MED ORDER — SODIUM CHLORIDE 0.9 % IV SOLN
INTRAVENOUS | Status: DC
  Administered 2014-08-10 – 2014-08-15 (×6): via INTRAVENOUS

## 2014-08-09 NOTE — ED Notes (Addendum)
Recently started  back drinking .  Pt states she has cirrhosis.  C/o abdominal pain and swelling.

## 2014-08-09 NOTE — Progress Notes (Signed)
PHARMACIST - PHYSICIAN ORDER COMMUNICATION  CONCERNING: P&T Medication Policy on Herbal Medications  DESCRIPTION:  This patient's order for:  Milk Thistle has been noted.  This product(s) is classified as an "herbal" or natural product. Due to a lack of definitive safety studies or FDA approval, nonstandard manufacturing practices, plus the potential risk of unknown drug-drug interactions while on inpatient medications, the Pharmacy and Therapeutics Committee does not permit the use of "herbal" or natural products of this type within [place].   ACTION TAKEN: The pharmacy department is unable to verify this order at this time and your patient has been informed of this safety policy. Please reevaluate patient's clinical condition at discharge and address if the herbal or natural product(s) should be resumed at that time.   

## 2014-08-09 NOTE — ED Provider Notes (Signed)
TIME SEEN: 1:25 PM  CHIEF COMPLAINT: Multiple complaints  HPI: Pt is a 59 y.o. female with history of alcohol abuse, alcoholic cirrhosis, CHF, tobacco use who presents to the emergency department with multiple complaints. She states over the past several days she has had increasing abdominal distention and pain. Also reports that she has been vomiting blood. Her last episode was yesterday. Also has noticed dark blood in her stool but this is also improving. Reports that she was sober for 3 years but began drinking again several months ago. Last drank alcohol 4 days ago. Does not drink every day and is his history of DTs or withdrawal seizures. Also complaining of wheezing, cough, shortness of breath. No chest pain. Had a fever 2 days ago the 102. Has also had diarrhea. No sick contacts or recent travel. States she was supposed to have outpatient abdominal ultrasound and blood work ordered by her gastroenterologist Dr. Karilyn Cotaehman after a visit on April 5 that she states she was scared of what they may find so she did not have this workup done. Reports her last paracentesis was in 2010/2011. No history of SBP.  ROS: See HPI Constitutional: no fever  Eyes: no drainage  ENT: no runny nose   Cardiovascular:  no chest pain  Resp:  SOB  GI:  vomiting GU: no dysuria Integumentary: no rash  Allergy: no hives  Musculoskeletal: no leg swelling  Neurological: no slurred speech ROS otherwise negative  PAST MEDICAL HISTORY/PAST SURGICAL HISTORY:  Past Medical History  Diagnosis Date  . Cirrhosis     ALCOHOLIC  . Alcoholic cirrhosis of liver   . CHF (congestive heart failure)   . Shortness of breath     exertion    MEDICATIONS:  Prior to Admission medications   Medication Sig Start Date End Date Taking? Authorizing Provider  alprazolam Prudy Feeler(XANAX) 2 MG tablet Take 2 mg by mouth at bedtime as needed for sleep. Patient states that she takes 1/2 half of the 2 mg as needed.    Historical Provider, MD   B-Complex TABS Take 1 tablet by mouth daily.    Historical Provider, MD  Calcium Carbonate-Vitamin D (CALCIUM 600+D) 600-400 MG-UNIT per tablet Take 1 tablet by mouth 2 (two) times daily.    Historical Provider, MD  furosemide (LASIX) 20 MG tablet Take 1 tablet (20 mg total) by mouth daily as needed. Patient taking differently: Take 20 mg by mouth daily.  12/21/13   Malissa HippoNajeeb U Rehman, MD  lactulose (CHRONULAC) 10 GM/15ML solution Take 15 mLs (10 g total) by mouth 2 (two) times daily. 02/16/13   Malissa HippoNajeeb U Rehman, MD  LORazepam (ATIVAN) 0.5 MG tablet Take 1 tablet (0.5 mg total) by mouth every 8 (eight) hours. 07/19/14   Malissa HippoNajeeb U Rehman, MD  Milk Thistle 150 MG CAPS Take 150 mg by mouth daily.    Historical Provider, MD  OVER THE COUNTER MEDICATION Dandelion Root -  Patient states that she takes 1 in the morning , 1 mid day , and 1 at night. This is to help with swelling.    Historical Provider, MD  pantoprazole (PROTONIX) 40 MG tablet Take 1 tablet (40 mg total) by mouth daily as needed. 12/21/13   Malissa HippoNajeeb U Rehman, MD  potassium chloride SA (K-DUR,KLOR-CON) 20 MEQ tablet Take 20 mEq by mouth. Patient states that she takes 3 times per week.    Historical Provider, MD    ALLERGIES:  Allergies  Allergen Reactions  . Ultram [Tramadol] Other (See Comments)  Headache    SOCIAL HISTORY:  History  Substance Use Topics  . Smoking status: Current Some Day Smoker -- 0.25 packs/day for 35 years  . Smokeless tobacco: Never Used     Comment: Smokes 2 cigarettes per week  . Alcohol Use: 0.0 oz/week    0 Standard drinks or equivalent per week    FAMILY HISTORY: No family history on file.  EXAM: BP 119/50 mmHg  Pulse 103  Temp(Src) 98.5 F (36.9 C) (Oral)  Resp 18  Ht  (1.727 m)  Wt 229 lb (103.874 kg)  BMI 34.83 kg/m2  SpO2 94% CONSTITUTIONAL: Alert and oriented and responds appropriately to questions. Chronically ill-appearing, anxious HEAD: Normocephalic EYES: Conjunctivae clear, PERRL  scleral icterus ENT: normal nose; no rhinorrhea; moist mucous membranes; pharynx without lesions noted NECK: Supple, no meningismus, no LAD  CARD: Regular and tachycardic; S1 and S2 appreciated; no murmurs, no clicks, no rubs, no gallops RESP: Normal chest excursion without splinting or tachypnea; patient has rhonchorous breath sounds diffusely with expiratory wheezing, diminished at her bases, no hypoxia or respiratory distress, speaking full sentences ABD/GI: Normal bowel sounds; abdomen distended with fluid wave but soft and nontender to palpation, no guarding or rebound, no peritoneal signs RECTAL:  No gross blood or melena, normal rectal tone BACK:  The back appears normal and is non-tender to palpation, there is no CVA tenderness EXT: Normal ROM in all joints; non-tender to palpation; nonpitting edema in bilateral lower extremities; normal capillary refill; no cyanosis    SKIN: Normal color for age and race; warm, jaundice NEURO: Moves all extremities equally, no dysarthria, no aphasia, no facial droop, no asterixis PSYCH: The patient's mood and manner are appropriate. Grooming and personal hygiene are appropriate.  MEDICAL DECISION MAKING: Patient here with concerns for worsening liver failure. She continues to drink alcohol. Also wheezing and patient has a history of tobacco use. Will obtain abdominal labs, urine, abdominal ultrasound, chest x-ray. I do not think that she has an acute abdomen at this time. She is afebrile.  ED PROGRESS: Patient has a nitrite-positive UTI. Given ceftriaxone. Culture pending. Her LFTs are elevated but close to where they were on April 5. Her INR however is more elevated. She is guaiac negative. Hemoglobin is slightly decreased from baseline. Her meld score is 24. [facility] consult gastroenterology on call.   Chest x-ray shows no edema or infiltrate. Abdominal ultrasound shows moderate ascites and cirrhotic liver. There is a gallstone without signs of  cholecystitis.    4:45 PM  D/w Dr. Karilyn Cota.  He will see patient in consult. Recommends medical admission. Will start patient on a clear liquid diet. Patient is still having mild hypoxia is likely secondary to COPD. Will place on oxygen and continue duo nebs. Will give dose of IV Solu-Medrol.  4:57 PM  D/w Dr. Karilyn Cota for admission to telemetry.   EKG Interpretation  Date/Time:  Tuesday August 09 2014 13:49:12 EDT Ventricular Rate:  103 PR Interval:  118 QRS Duration: 88 QT Interval:  370 QTC Calculation: 484 R Axis:   80 Text Interpretation:  Sinus tachycardia Low voltage, precordial leads Borderline T wave abnormalities No significant change since last tracing Confirmed by Taejah Ohalloran,  DO, Sophina Mitten [phone]) on 08/09/2014 2:00:03 PM        Layla Maw Gwendlyn Hanback, DO 08/09/14 1658

## 2014-08-09 NOTE — H&P (Addendum)
[facility] History and Physical  Marissa Figueroa ZOX:[id number] DOB: [birth year] DOA: 08/09/2014  Referring physician: ER, Dr. Elesa Massed. PCP: No PCP Per Patient   Chief Complaint: Hematemesis.  HPI: Marissa Figueroa is a 59 y.o. female  This is a 59 year old lady, who has a history of alcohol excess cirrhosis, and continues to drink alcohol, presented with hematemesis, several episodes that occurred 4 days ago. During that day, she estimates that she vomited in total approximately half a cupful. The following day, she had further episodes of hematemesis. In the last couple of days she has had no further episodes of hematemesis. She has had swelling of her abdomen without any pain or fever. She apparently also noticed dark blood in her stool but this has been improving. She apparently had been sober of alcohol for 3 years but began drinking again several months ago. She last drank alcohol 4 days ago when she started to have the hematemesis initially. Also, 2 days ago she had a fever of 102. She has no longer a fever. She is now being admitted for further management.   Review of Systems:  Apart from symptoms above, all systems negative.   Past Medical History  Diagnosis Date  . Cirrhosis     ALCOHOLIC  . Alcoholic cirrhosis of liver   . CHF (congestive heart failure)   . Shortness of breath     exertion   Past Surgical History  Procedure Laterality Date  . Tubal ligation    . Tonsilectomy, adenoidectomy, bilateral myringotomy and tubes    . Paracentesis  07/06/08  . Paracentesis  05/25/2008   Social History:  reports that she has been smoking.  She has never used smokeless tobacco. She reports that she drinks alcohol. She reports that she does not use illicit drugs.  Allergies  Allergen Reactions  . Ultram [Tramadol] Other (See Comments)    Headache     Family history: There is no family history of liver disease.   Prior to Admission medications   Medication Sig  Start Date End Date Taking? Authorizing Provider  B Complex-Biotin-FA (B-COMPLEX PO) Take 1 tablet by mouth daily.   Yes Historical Provider, MD  Calcium Carbonate-Vitamin D (CALCIUM 600+D) 600-400 MG-UNIT per tablet Take 1 tablet by mouth 2 (two) times daily.   Yes Historical Provider, MD  furosemide (LASIX) 20 MG tablet Take 1 tablet (20 mg total) by mouth daily as needed. Patient taking differently: Take 20 mg by mouth daily.  12/21/13  Yes Malissa Hippo, MD  lactulose (CHRONULAC) 10 GM/15ML solution Take 15 mLs (10 g total) by mouth 2 (two) times daily. 02/16/13  Yes Malissa Hippo, MD  LORazepam (ATIVAN) 0.5 MG tablet Take 1 tablet (0.5 mg total) by mouth every 8 (eight) hours. 07/19/14  Yes Malissa Hippo, MD  Milk Thistle 150 MG CAPS Take 150 mg by mouth daily.   Yes Historical Provider, MD  spironolactone (ALDACTONE) 25 MG tablet Take 50 mg by mouth daily.   Yes Historical Provider, MD  alprazolam Prudy Feeler) 2 MG tablet Take 2 mg by mouth at bedtime as needed for sleep. Patient states that she takes 1/2 half of the 2 mg as needed.    Historical Provider, MD  pantoprazole (PROTONIX) 40 MG tablet Take 1 tablet (40 mg total) by mouth daily as needed. Patient not taking: Reported on 08/09/2014 12/21/13   Malissa Hippo, MD   Physical Exam: Filed Vitals:   08/09/14 1600 08/09/14 1615 08/09/14 1630 08/09/14  1645  BP: 123/57  130/60   Pulse: 106 106 102 105  Temp:      TempSrc:      Resp: Height:      Weight:      SpO2: 83% 86% 90% 96%    Wt Readings from Last 3 Encounters:  08/09/14 103.874 kg (229 lb)  08/08/14 103.919 kg (229 lb 1.6 oz)  07/19/14 99.247 kg (218 lb 12.8 oz)    General:  Appears calm and comfortable. She is alert and oriented. She is jaundiced. Eyes: PERRL, normal lids, irises & conjunctiva ENT: grossly normal hearing, lips & tongue Neck: no LAD, masses or thyromegaly Cardiovascular: RRR, no m/r/g. No LE edema. Telemetry: SR, no arrhythmias    Respiratory: CTA bilaterally, no w/r/r. Normal respiratory effort. Abdomen: soft, ntnd. She does have abdominal swelling, consistent with ascites but she is not tender. Skin: no rash or induration seen on limited exam Musculoskeletal: grossly normal tone BUE/BLE Psychiatric: grossly normal mood and affect, speech fluent and appropriate Neurologic: grossly non-focal.          Labs on Admission:  Basic Metabolic Panel:  Recent Labs Lab 08/09/14 1127  NA 131*  K 3.1*  CL 94*  CO2 30  GLUCOSE 220*  BUN 8  CREATININE 0.68  CALCIUM 8.1*   Liver Function Tests:  Recent Labs Lab 08/09/14 1127  AST 91*  ALT 35  ALKPHOS 133*  BILITOT 10.1*  PROT 6.2  ALBUMIN 1.6*    Recent Labs Lab 08/09/14 1127  LIPASE 69*   No results for input(s): AMMONIA in the last 168 hours. CBC:  Recent Labs Lab 08/09/14 1127  WBC 6.5  NEUTROABS 4.4  HGB 9.6*  HCT 29.8*  MCV 104.9*  PLT 48*   Cardiac Enzymes: No results for input(s): CKTOTAL, CKMB, CKMBINDEX, TROPONINI in the last 168 hours.  BNP (last 3 results)  Recent Labs  08/09/14 1459  BNP 178.0*    ProBNP (last 3 results) No results for input(s): PROBNP in the last 8760 hours.  CBG: No results for input(s): GLUCAP in the last 168 hours.  Radiological Exams on Admission: Dg Chest 2 View  08/09/2014   CLINICAL DATA:  Shortness of breath and weakness  EXAM: CHEST  2 VIEW  COMPARISON:  June 27, 2013  FINDINGS: There is no edema or consolidation. The heart size and pulmonary vascularity are normal. No adenopathy. There is degenerative change in the thoracic spine.  IMPRESSION: No edema or consolidation.   Electronically Signed   By: Bretta Bang III M.D.   On: 08/09/2014 15:48   US Abdomen Complete  08/09/2014   CLINICAL DATA:  Jaundice for 1 month.  Vomiting.  EXAM: ULTRASOUND ABDOMEN COMPLETE  COMPARISON:  CT abdomen with and without contrast 06/26/2012.  FINDINGS: Gallbladder: A single mobile gallstone measuring  1.5 cm is identified. The gallbladder wall appears thickened Ms. a small amount pericholecystic fluid. Sonographer reports negative Murphy's sign.  Common bile duct: Diameter: 0.7 cm  Liver: Heterogeneous echotexture and a nodular border consistent with cirrhosis are identified. A small to moderate volume of abdominal ascites is seen. Blood flow within a peripheral branch of the portal vein is reverse and the main portal vein appears thrombosed.  IVC: No abnormality visualized.  Pancreas: Visualized portion unremarkable.  Spleen: Mildly enlarged at 529 cm cubed. 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  Right Kidney: Length: 11.9 cm. Echogenicity within normal limits. No mass or hydronephrosis visualized.  Left Kidney: Length: 13.2  cm. Echogenicity within normal limits. No mass or hydronephrosis visualized.  Abdominal aorta: No aneurysm visualized.  Other findings: None.  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  Single mobile gallstone without evidence of cholecystitis. Gallbladder wall thickening is likely related to ascites.   Electronically Signed   By: Drusilla Kannerhomas  Dalessio M.D.   On: 08/09/2014 15:04      Assessment/Plan   1. Hematemesis. Ultrasound of the abdomen does show the presence of mildly enlarged spleen with perisplenic varices. I would be somewhat concerned about esophageal varices in this patient with decompensated liver disease. Her hemoglobin is stable and she is hemodynamically stable at the present time although the hemoglobin has dropped from previous hemoglobin measured. We will keep a close eye on this and she may need blood transfusion. We will keep her nothing by mouth from midnight in case she requires endoscopy. We will request gastroenterology to see the patient. 2. Decompensated alcoholic liver disease with cirrhosis and moderate ascites. Her bilirubin is elevated at 10.1 and her INR is also elevated at  3.36. Her platelet count is reduced at 48. This bodes a poor prognosis. 3. Ongoing alcohol abuse. I discussed this in detail regarding alcohol abuse and the need for rehabilitation/cessation. She understands and she wants to pursue this again. 4. UTI. She'll be treated with intravenous Rocephin.  Further recommendations will depend on patient's hospital progress.   Code Status: Full code.   DVT Prophylaxis: SCDs  Family Communication: I discussed the plan with the patient at the bedside.   Disposition Plan: Home when medically stable.  Time spent: 60 minutes.  Wilson SingerGOSRANI,Njeri Vicente C [facility] Pager [phone].

## 2014-08-10 ENCOUNTER — Inpatient Hospital Stay (HOSPITAL_COMMUNITY): Payer: Medicare Other

## 2014-08-10 ENCOUNTER — Encounter (HOSPITAL_COMMUNITY): Payer: Self-pay | Admitting: *Deleted

## 2014-08-10 DIAGNOSIS — E871 Hypo-osmolality and hyponatremia: Secondary | ICD-10-CM

## 2014-08-10 DIAGNOSIS — D539 Nutritional anemia, unspecified: Secondary | ICD-10-CM | POA: Diagnosis present

## 2014-08-10 DIAGNOSIS — R0902 Hypoxemia: Secondary | ICD-10-CM

## 2014-08-10 DIAGNOSIS — D62 Acute posthemorrhagic anemia: Secondary | ICD-10-CM

## 2014-08-10 DIAGNOSIS — D696 Thrombocytopenia, unspecified: Secondary | ICD-10-CM | POA: Diagnosis present

## 2014-08-10 LAB — COMPREHENSIVE METABOLIC PANEL
ALBUMIN: 1.5 g/dL — AB (ref 3.5–5.2)
ALT: 38 U/L — ABNORMAL HIGH (ref 0–35)
AST: 78 U/L — AB (ref 0–37)
Alkaline Phosphatase: 147 U/L — ABNORMAL HIGH (ref 39–117)
Anion gap: 5 (ref 5–15)
BUN: 8 mg/dL (ref 6–23)
CALCIUM: 8.2 mg/dL — AB (ref 8.4–10.5)
CO2: 32 mmol/L (ref 19–32)
Chloride: 97 mmol/L (ref 96–112)
Creatinine, Ser: 0.66 mg/dL (ref 0.50–1.10)
GFR calc Af Amer: >90 mL/min (ref >90)
Glucose, Bld: 216 mg/dL — ABNORMAL HIGH (ref 70–99)
POTASSIUM: 3.7 mmol/L (ref 3.5–5.1)
SODIUM: 134 mmol/L — AB (ref 135–145)
Total Bilirubin: 8.4 mg/dL — ABNORMAL HIGH (ref 0.3–1.2)
Total Protein: 6.2 g/dL (ref 6.0–8.3)

## 2014-08-10 LAB — CBC
HCT: 28.6 % — ABNORMAL LOW (ref 36.0–46.0)
Hemoglobin: 9.4 g/dL — ABNORMAL LOW (ref 12.0–15.0)
MCH: 34.3 pg — ABNORMAL HIGH (ref 26.0–34.0)
MCHC: 32.9 g/dL (ref 30.0–36.0)
MCV: 104.4 fL — AB (ref 78.0–100.0)
PLATELETS: 48 10*3/uL — AB (ref 150–400)
RBC: 2.74 MIL/uL — AB (ref 3.87–5.11)
RDW: 16.3 % — AB (ref 11.5–15.5)
WBC: 7.2 10*3/uL (ref 4.0–10.5)

## 2014-08-10 LAB — GLUCOSE, CAPILLARY
Glucose-Capillary: 202 mg/dL — ABNORMAL HIGH (ref 70–99)
Glucose-Capillary: 170 mg/dL — ABNORMAL HIGH (ref 70–99)

## 2014-08-10 LAB — PROTIME-INR
INR: 3.5 — ABNORMAL HIGH (ref 0.00–1.49)
PROTHROMBIN TIME: 35.4 s — AB (ref 11.6–15.2)

## 2014-08-10 MED ORDER — VITAMIN K1 10 MG/ML IJ SOLN
10.0000 mg | Freq: Once | INTRAMUSCULAR | Status: AC
  Administered 2014-08-10: 10 mg via SUBCUTANEOUS
  Filled 2014-08-10: qty 1

## 2014-08-10 MED ORDER — LORAZEPAM 2 MG/ML IJ SOLN
0.0000 mg | Freq: Four times a day (QID) | INTRAMUSCULAR | Status: AC
  Administered 2014-08-10 – 2014-08-11 (×4): 2 mg via INTRAVENOUS
  Filled 2014-08-10 (×5): qty 1

## 2014-08-10 MED ORDER — LORAZEPAM 2 MG/ML IJ SOLN
0.0000 mg | Freq: Two times a day (BID) | INTRAMUSCULAR | Status: AC
  Administered 2014-08-12: 2 mg via INTRAVENOUS
  Filled 2014-08-10 (×2): qty 1

## 2014-08-10 MED ORDER — ADULT MULTIVITAMIN W/MINERALS CH
1.0000 | ORAL_TABLET | Freq: Every day | ORAL | Status: DC
  Administered 2014-08-10 – 2014-08-18 (×9): 1 via ORAL
  Filled 2014-08-10 (×9): qty 1

## 2014-08-10 MED ORDER — INSULIN ASPART 100 UNIT/ML ~~LOC~~ SOLN: 0.0000 [IU] | Freq: Every day | SUBCUTANEOUS | Status: DC

## 2014-08-10 MED ORDER — PANTOPRAZOLE SODIUM 40 MG IV SOLR
40.0000 mg | Freq: Two times a day (BID) | INTRAVENOUS | Status: DC
  Administered 2014-08-10 – 2014-08-11 (×4): 40 mg via INTRAVENOUS
  Filled 2014-08-10 (×4): qty 40

## 2014-08-10 MED ORDER — LORAZEPAM 2 MG/ML IJ SOLN
1.0000 mg | Freq: Four times a day (QID) | INTRAMUSCULAR | Status: AC | PRN
  Administered 2014-08-12: 1 mg via INTRAVENOUS
  Filled 2014-08-10: qty 1

## 2014-08-10 MED ORDER — DEXTROSE 5 % IV SOLN
1.0000 g | INTRAVENOUS | Status: DC
  Administered 2014-08-10 – 2014-08-15 (×6): 1 g via INTRAVENOUS
  Filled 2014-08-10 (×7): qty 10

## 2014-08-10 MED ORDER — FOLIC ACID 1 MG PO TABS
1.0000 mg | ORAL_TABLET | Freq: Every day | ORAL | Status: DC
  Administered 2014-08-10 – 2014-08-18 (×9): 1 mg via ORAL
  Filled 2014-08-10 (×9): qty 1

## 2014-08-10 MED ORDER — LORAZEPAM 1 MG PO TABS: 1.0000 mg | ORAL_TABLET | Freq: Four times a day (QID) | ORAL | Status: AC | PRN

## 2014-08-10 MED ORDER — PHYTONADIONE 1 MG/0.5 ML ORAL SOLUTION
5.0000 mg | Freq: Once | ORAL | Status: DC
  Filled 2014-08-10: qty 2.5

## 2014-08-10 MED ORDER — INSULIN ASPART 100 UNIT/ML ~~LOC~~ SOLN
0.0000 [IU] | Freq: Three times a day (TID) | SUBCUTANEOUS | Status: DC
  Administered 2014-08-10: 3 [IU] via SUBCUTANEOUS
  Administered 2014-08-11 (×3): 1 [IU] via SUBCUTANEOUS
  Administered 2014-08-12: 2 [IU] via SUBCUTANEOUS
  Administered 2014-08-12 – 2014-08-16 (×3): 1 [IU] via SUBCUTANEOUS
  Administered 2014-08-16: 2 [IU] via SUBCUTANEOUS
  Administered 2014-08-17 – 2014-08-18 (×5): 1 [IU] via SUBCUTANEOUS

## 2014-08-10 MED ORDER — INSULIN ASPART 100 UNIT/ML ~~LOC~~ SOLN
3.0000 [IU] | Freq: Three times a day (TID) | SUBCUTANEOUS | Status: DC
  Administered 2014-08-11 – 2014-08-16 (×8): 3 [IU] via SUBCUTANEOUS

## 2014-08-10 MED ORDER — VITAMIN B-1 100 MG PO TABS
100.0000 mg | ORAL_TABLET | Freq: Every day | ORAL | Status: DC
  Administered 2014-08-10 – 2014-08-18 (×8): 100 mg via ORAL
  Filled 2014-08-10 (×8): qty 1

## 2014-08-10 MED ORDER — NICOTINE 21 MG/24HR TD PT24
21.0000 mg | MEDICATED_PATCH | Freq: Every day | TRANSDERMAL | Status: DC
  Administered 2014-08-10 – 2014-08-18 (×9): 21 mg via TRANSDERMAL
  Filled 2014-08-10 (×9): qty 1

## 2014-08-10 MED ORDER — IPRATROPIUM-ALBUTEROL 0.5-2.5 (3) MG/3ML IN SOLN: 3.0000 mL | Freq: Four times a day (QID) | RESPIRATORY_TRACT | Status: DC | PRN

## 2014-08-10 MED ORDER — THIAMINE HCL 100 MG/ML IJ SOLN
100.0000 mg | Freq: Every day | INTRAMUSCULAR | Status: DC
  Administered 2014-08-12: 100 mg via INTRAVENOUS
  Filled 2014-08-10: qty 2

## 2014-08-10 NOTE — Care Management Note (Addendum)
    Page 1 of 1   08/17/2014     3:22:33 PM CARE MANAGEMENT NOTE 08/17/2014  Patient:  Marissa Figueroa,[name]   Account Number:  [id number]  Date Initiated:  08/10/2014  Documentation initiated by:  Kathyrn SheriffHILDRESS,JESSICA  Subjective/Objective Assessment:   Pt is from home, lives with daughter, who cares for her. Pt requires assistance with house work and bathing. Pt has no DME's or HH services. Pt states she has no PCP. [facility] is listed on her insurance card.     Action/Plan:   [facility] contacted and they have not seen pt. [facility]. Pt says she needs help at home bc she is unable to perform ADL's.   Anticipated DC Date:  08/12/2014   Anticipated DC Plan:  SKILLED NURSING FACILITY  In-house referral  Clinical Social Worker      DC Planning Services  CM consult      Choice offered to / List presented to:             Status of service:  Completed, signed off Medicare Important Message given?  YES (If response is "NO", the following Medicare IM given date fields will be blank) Date Medicare IM given:  08/12/2014 Medicare IM given by:  Kathyrn SheriffHILDRESS,JESSICA Date Additional Medicare IM given:  08/17/2014 Additional Medicare IM given by:  JESSICA CHILDRESS  Discharge Disposition:  SKILLED NURSING FACILITY  Per UR Regulation:    If discussed at Long Length of Stay Meetings, dates discussed:    Comments:  08/17/2014 1500 Kathyrn SheriffJessica Childress, RN, MSN, CM Pt plans for DC today. No CM needs. Pt going to SNF.  08/12/2014 1750 Kathyrn SheriffJessica Childress, RN, MSN, CM PT has recommended SNF. CSW is aware and will arrange for placement at discharge. No further CM needs. 08/10/2014 1600 Kathyrn SheriffJessica childress, RN, MSN, CM Pt will need PT consult, and home O2 assesment prior to discharge. Anticipate need for neb and other DME's. Will cont to follow.

## 2014-08-10 NOTE — Progress Notes (Signed)
[facility] PROGRESS NOTE Interim History: 59 year old with past medical history of cirrhosis due to alcohol abuse comes in for hematemesis to started 4 days prior to admission with some melanotic stools.   Assessment/Plan: Acute blood loss anemia due to Hematemesis: - His drop from 11.7 back on 07/18/2014 to 9.4 on 08/09/2014. - GI has already been consulted, he has a large spleen on ultrasound which can 0.2 elevated portal pressures. - I will type and screen 2 units of packed red blood cells. He is hemodynamically stable.  Alcoholic cirrhosis of liver with ascites/Bilirubinemia: - He has a meld score of 28, abdominal ultrasound showed ascites. - PT and INR is 3.5 he is on no oral anticoagulation.  Macrocytic anemia/Thrombocytopenia - Likely due to a call abuse, his platelets have been stable at 48.    Hyponatremia - Due to secondary hyperaldosteronism, continue Aldactone.    ETOH abuse - Counseling, monitor with CIWAprotocol - ETOG level than 5. Continue thiamine and folate.  Code Status: Full code.  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 Single mobile gallstone without evidence of cholecystitis. Gallbladder wall thickening is likely related to ascites.   Antibiotics:  None  HPI/Subjective: No further hematemesis. No further melena  Objective: Filed Vitals:   08/09/14 1935 08/09/14 2210 08/10/14 0453 08/10/14 0715  BP:  122/64 128/51   Pulse:  101 100   Temp:  98.5 F (36.9 C) 98.2 F (36.8 C)   TempSrc:  Oral Oral   Resp:  20 20   Height:      Weight:   106.323 kg (234 lb 6.4 oz)   SpO2: 89% 93% 92% 78%    Intake/Output Summary (Last 24 hours) at 08/10/14 0849 Last data filed at 08/10/14 0700  Gross per 24 hour  Intake    560 ml  Output     900 ml  Net   -340 ml   Filed Weights   08/09/14 1105 08/10/14 0453  Weight: 103.874 kg (229 lb) 106.323 kg (234 lb 6.4 oz)    Exam:  General: Alert, awake, oriented x3, in no acute distress.  HEENT: No bruits, no goiter.  Heart: Regular rate and rhythm. Lungs: Good air movement, clear Abdomen: Soft, nontender, nondistended, positive bowel sounds.  Neuro: Grossly intact, nonfocal.   Data Reviewed: Basic Metabolic Panel:  Recent Labs Lab 08/09/14 1127 08/10/14 0524  NA 131* 134*  K 3.1* 3.7  CL 94* 97  CO2 30 32  GLUCOSE 220* 216*  BUN 8 8  CREATININE 0.68 0.66  CALCIUM 8.1* 8.2*   Liver Function Tests:  Recent Labs Lab 08/09/14 1127 08/10/14 0524  AST 91* 78*  ALT 35 38*  ALKPHOS 133* 147*  BILITOT 10.1* 8.4*  PROT 6.2 6.2  ALBUMIN 1.6* 1.5*    Recent Labs Lab 08/09/14 1127  LIPASE 69*   No results for input(s): AMMONIA in the last 168 hours. CBC:  Recent Labs Lab 08/09/14 1127 08/10/14 0524  WBC 6.5 7.2  NEUTROABS 4.4  --   HGB 9.6* 9.4*  HCT 29.8* 28.6*  MCV 104.9* 104.4*  PLT 48* 48*   Cardiac Enzymes: No results for input(s): CKTOTAL, CKMB, CKMBINDEX, TROPONINI in the last 168 hours. BNP (last 3 results)  Recent Labs  08/09/14 1459  BNP 178.0*    ProBNP (last 3  results) No results for input(s): PROBNP in the last 8760 hours.  CBG: No results for input(s): GLUCAP in the last 168 hours.  No results found for this or any previous visit (from the past 240 hour(s)).   Studies: Dg Chest 2 View  08/09/2014   CLINICAL DATA:  Shortness of breath and weakness  EXAM: CHEST  2 VIEW  COMPARISON:  June 27, 2013  FINDINGS: There is no edema or consolidation. The heart size and pulmonary vascularity are normal. No adenopathy. There is degenerative change in the thoracic spine.  IMPRESSION: No edema or consolidation.   Electronically Signed   By: Bretta Bang III M.D.   On: 08/09/2014 15:48   US Abdomen Complete  08/09/2014    CLINICAL DATA:  Jaundice for 1 month.  Vomiting.  EXAM: ULTRASOUND ABDOMEN COMPLETE  COMPARISON:  CT abdomen with and without contrast 06/26/2012.  FINDINGS: Gallbladder: A single mobile gallstone measuring 1.5 cm is identified. The gallbladder wall appears thickened Ms. a small amount pericholecystic fluid. Sonographer reports negative Murphy's sign.  Common bile duct: Diameter: 0.7 cm  Liver: Heterogeneous echotexture and a nodular border consistent with cirrhosis are identified. A small to moderate volume of abdominal ascites is seen. Blood flow within a peripheral branch of the portal vein is reverse and the main portal vein appears thrombosed.  IVC: No abnormality visualized.  Pancreas: Visualized portion unremarkable.  Spleen: Mildly enlarged at 529 cm cubed. 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  Right Kidney: Length: 11.9 cm. Echogenicity within normal limits. No mass or hydronephrosis visualized.  Left Kidney: Length: 13.2 cm. Echogenicity within normal limits. No mass or hydronephrosis visualized.  Abdominal aorta: No aneurysm visualized.  Other findings: None.  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  Single mobile gallstone without evidence of cholecystitis. Gallbladder wall thickening is likely related to ascites.   Electronically Signed   By: Drusilla Kanner M.D.   On: 08/09/2014 15:04    Scheduled Meds: . calcium-vitamin D  1 tablet Oral BID  . cefTRIAXone (ROCEPHIN)  IV  1 g Intravenous Q24H  . furosemide  20 mg Oral Daily  . ipratropium-albuterol  3 mL Nebulization TID  . lactulose  10 g Oral BID  . LORazepam  0.5 mg Oral 3 times per day  . sodium chloride  3 mL Intravenous Q12H  . spironolactone  50 mg Oral Daily   Continuous Infusions: . sodium chloride      Time Spent: 25 min   Marinda Elk  [facility] Pager [phone]. If 7PM-7AM, please contact night-coverage at [email or web address],  password [facility] 08/10/2014, 8:49 AM  LOS: 1 day

## 2014-08-10 NOTE — Consult Note (Addendum)
Reason for Consult: alcoholic cirrhosis Referring Physician: hospitalist  Marissa Figueroa is an 59 y.o. female.  HPI: Admitted thru the ED yesterday. She says she needs to be in Rehab for her etoh abuse. Hx of known cirrhosis.  She says she drinks way more than she should.  This past Friday she drank she drank 3/4 to a 5th of Vodka. She says afterwards she started to vomit. She says she vomited blood. She vomited blood Friday and Saturday. Saturday she states she had a fever of 102. No fever since. No vomiting since Saturday.  She says she has been drinking off and on for about 3 months.  She has abdominal distention/ascites. She underwent a paracentesis several years ago in [place].  Weight in office 07/19/2014 was 218. Admission weight 234. She states her stools are not black. She was guaiac negative in the ED on admission. PT/INR 34.3  And 3.36. CBC Latest Ref Rng 08/10/2014 08/09/2014 07/18/2014  WBC 4.0 - 10.5 K/uL 7.2 6.5 6.6  Hemoglobin 12.0 - 15.0 g/dL 9.4(L) 9.6(L) 11.7(L)  Hematocrit 36.0 - 46.0 % 28.6(L) 29.8(L) 33.2(L)  Platelets 150 - 400 K/uL 48(L) 48(L) 43(L)    CMP Latest Ref Rng 08/10/2014 08/09/2014 07/18/2014  Glucose 70 - 99 mg/dL 216(H) 220(H) 97  BUN 6 - 23 mg/dL $Remove'8 8 8  'kIlAPoW$ Creatinine 0.50 - 1.10 mg/dL 0.66 0.68 0.87  Sodium 135 - 145 mmol/L 134(L) 131(L) 136  Potassium 3.5 - 5.1 mmol/L 3.7 3.1(L) 3.4(L)  Chloride 96 - 112 mmol/L 97 94(L) 96  CO2 19 - 32 mmol/L 32 30 29  Calcium 8.4 - 10.5 mg/dL 8.2(L) 8.1(L) 8.3(L)  Total Protein 6.0 - 8.3 g/dL 6.2 6.2 6.9  Total Bilirubin 0.3 - 1.2 mg/dL 8.4(H) 10.1(H) 11.0(H)  Alkaline Phos 39 - 117 U/L 147(H) 133(H) 145(H)  AST 0 - 37 U/L 78(H) 91(H) 110(H)  ALT 0 - 35 U/L 38(H) 35 43(H)   BMET    Component Value Date/Time   NA 134* 08/10/2014 0524   NA 137 05/24/2009   K 3.7 08/10/2014 0524   CL 97 08/10/2014 0524   CO2 32 08/10/2014 0524   GLUCOSE 216* 08/10/2014 0524   BUN 8 08/10/2014 0524   CREATININE 0.66 08/10/2014  0524   CREATININE 0.87 07/18/2014 1601   CALCIUM 8.2* 08/10/2014 0524   GFRNONAA >90 08/10/2014 0524   GFRAA >90 08/10/2014 0524        Past Medical History  Diagnosis Date  . Cirrhosis     ALCOHOLIC  . Alcoholic cirrhosis of liver   . CHF (congestive heart failure)   . Shortness of breath     exertion    Past Surgical History  Procedure Laterality Date  . Tubal ligation    . Tonsilectomy, adenoidectomy, bilateral myringotomy and tubes    . Paracentesis  07/06/08  . Paracentesis  05/25/2008    History reviewed. No pertinent family history.  Social History:  reports that she has been smoking.  She has never used smokeless tobacco. She reports that she drinks alcohol. She reports that she does not use illicit drugs.  Allergies:  Allergies  Allergen Reactions  . Ultram [Tramadol] Other (See Comments)    Headache    Medications: I have reviewed the patient's current medications.  Results for orders placed or performed during the hospital encounter of 08/09/14 (from the past 48 hour(s))  CBC with Differential     Status: Abnormal   Collection Time: 08/09/14 11:27 AM  Result Value  Ref Range   WBC 6.5 4.0 - 10.5 K/uL   RBC 2.84 (L) 3.87 - 5.11 MIL/uL   Hemoglobin 9.6 (L) 12.0 - 15.0 g/dL   HCT 29.8 (L) 36.0 - 46.0 %   MCV 104.9 (H) 78.0 - 100.0 fL   MCH 33.8 26.0 - 34.0 pg   MCHC 32.2 30.0 - 36.0 g/dL   RDW 16.3 (H) 11.5 - 15.5 %   Platelets 48 (L) 150 - 400 K/uL    Comment: SPECIMEN CHECKED FOR CLOTS PLATELET COUNT CONFIRMED BY SMEAR    Neutrophils Relative % 68 43 - 77 %   Neutro Abs 4.4 1.7 - 7.7 K/uL   Lymphocytes Relative 22 12 - 46 %   Lymphs Abs 1.4 0.7 - 4.0 K/uL   Monocytes Relative 7 3 - 12 %   Monocytes Absolute 0.5 0.1 - 1.0 K/uL   Eosinophils Relative 3 0 - 5 %   Eosinophils Absolute 0.2 0.0 - 0.7 K/uL   Basophils Relative 1 0 - 1 %   Basophils Absolute 0.0 0.0 - 0.1 K/uL   Smear Review PLATELETS APPEAR DECREASED   Comprehensive metabolic  panel     Status: Abnormal   Collection Time: 08/09/14 11:27 AM  Result Value Ref Range   Sodium 131 (L) 135 - 145 mmol/L   Potassium 3.1 (L) 3.5 - 5.1 mmol/L   Chloride 94 (L) 96 - 112 mmol/L   CO2 30 19 - 32 mmol/L   Glucose, Bld 220 (H) 70 - 99 mg/dL   BUN 8 6 - 23 mg/dL   Creatinine, Ser 0.68 0.50 - 1.10 mg/dL   Calcium 8.1 (L) 8.4 - 10.5 mg/dL   Total Protein 6.2 6.0 - 8.3 g/dL   Albumin 1.6 (L) 3.5 - 5.2 g/dL   AST 91 (H) 0 - 37 U/L   ALT 35 0 - 35 U/L   Alkaline Phosphatase 133 (H) 39 - 117 U/L   Total Bilirubin 10.1 (H) 0.3 - 1.2 mg/dL   GFR calc non Af Amer >90 >90 mL/min   GFR calc Af Amer >90 >90 mL/min    Comment: (NOTE) The eGFR has been calculated using the CKD EPI equation. This calculation has not been validated in all clinical situations. eGFR's persistently <90 mL/min signify possible Chronic Kidney Disease.    Anion gap 7 5 - 15  Lipase, blood     Status: Abnormal   Collection Time: 08/09/14 11:27 AM  Result Value Ref Range   Lipase 69 (H) 11 - 59 U/L  Urinalysis, Routine w reflex microscopic     Status: Abnormal   Collection Time: 08/09/14 11:30 AM  Result Value Ref Range   Color, Urine ORANGE (A) YELLOW    Comment: BIOCHEMICALS MAY BE AFFECTED BY COLOR   APPearance CLOUDY (A) CLEAR   Specific Gravity, Urine 1.015 1.005 - 1.030   pH 5.5 5.0 - 8.0   Glucose, UA 100 (A) NEGATIVE mg/dL   Hgb urine dipstick MODERATE (A) NEGATIVE   Bilirubin Urine LARGE (A) NEGATIVE   Ketones, ur TRACE (A) NEGATIVE mg/dL   Protein, ur TRACE (A) NEGATIVE mg/dL   Urobilinogen, UA 4.0 (H) 0.0 - 1.0 mg/dL   Nitrite POSITIVE (A) NEGATIVE   Leukocytes, UA SMALL (A) NEGATIVE  Urine microscopic-add on     Status: Abnormal   Collection Time: 08/09/14 11:30 AM  Result Value Ref Range   Squamous Epithelial / LPF MANY (A) RARE   WBC, UA 3-6 <3 WBC/hpf   RBC /  HPF 7-10 <3 RBC/hpf   Bacteria, UA MANY (A) RARE  POC occult blood, ED     Status: None   Collection Time: 08/09/14   1:32 PM  Result Value Ref Range   Fecal Occult Bld NEGATIVE NEGATIVE  Type and screen     Status: None   Collection Time: 08/09/14  1:48 PM  Result Value Ref Range   ABO/RH(D) O POS    Antibody Screen NEG    Sample Expiration 08/12/2014   Ethanol     Status: None   Collection Time: 08/09/14  1:48 PM  Result Value Ref Range   Alcohol, Ethyl (B) <5 0 - 9 mg/dL    Comment:        LOWEST DETECTABLE LIMIT FOR SERUM ALCOHOL IS 11 mg/dL FOR MEDICAL PURPOSES ONLY   Brain natriuretic peptide     Status: Abnormal   Collection Time: 08/09/14  2:59 PM  Result Value Ref Range   B Natriuretic Peptide 178.0 (H) 0.0 - 100.0 pg/mL  Protime-INR     Status: Abnormal   Collection Time: 08/09/14  2:59 PM  Result Value Ref Range   Prothrombin Time 34.3 (H) 11.6 - 15.2 seconds   INR 3.36 (H) 0.00 - 1.49  APTT     Status: Abnormal   Collection Time: 08/09/14  2:59 PM  Result Value Ref Range   aPTT 51 (H) 24 - 37 seconds    Comment:        IF BASELINE aPTT IS ELEVATED, SUGGEST PATIENT RISK ASSESSMENT BE USED TO DETERMINE APPROPRIATE ANTICOAGULANT THERAPY.   CBC     Status: Abnormal   Collection Time: 08/10/14  5:24 AM  Result Value Ref Range   WBC 7.2 4.0 - 10.5 K/uL   RBC 2.74 (L) 3.87 - 5.11 MIL/uL   Hemoglobin 9.4 (L) 12.0 - 15.0 g/dL   HCT 28.6 (L) 36.0 - 46.0 %   MCV 104.4 (H) 78.0 - 100.0 fL   MCH 34.3 (H) 26.0 - 34.0 pg   MCHC 32.9 30.0 - 36.0 g/dL   RDW 16.3 (H) 11.5 - 15.5 %   Platelets 48 (L) 150 - 400 K/uL    Comment: SPECIMEN CHECKED FOR CLOTS CONSISTENT WITH PREVIOUS RESULT   Protime-INR     Status: Abnormal   Collection Time: 08/10/14  5:24 AM  Result Value Ref Range   Prothrombin Time 35.4 (H) 11.6 - 15.2 seconds   INR 3.50 (H) 0.00 - 1.49    Dg Chest 2 View  08/09/2014   CLINICAL DATA:  Shortness of breath and weakness  EXAM: CHEST  2 VIEW  COMPARISON:  June 27, 2013  FINDINGS: There is no edema or consolidation. The heart size and pulmonary vascularity are normal.  No adenopathy. There is degenerative change in the thoracic spine.  IMPRESSION: No edema or consolidation.   Electronically Signed   By: Lowella Grip III M.D.   On: 08/09/2014 15:48   US Abdomen Complete  08/09/2014   CLINICAL DATA:  Jaundice for 1 month.  Vomiting.  EXAM: ULTRASOUND ABDOMEN COMPLETE  COMPARISON:  CT abdomen with and without contrast 06/26/2012.  FINDINGS: Gallbladder: A single mobile gallstone measuring 1.5 cm is identified. The gallbladder wall appears thickened Ms. a small amount pericholecystic fluid. Sonographer reports negative Murphy's sign.  Common bile duct: Diameter: 0.7 cm  Liver: Heterogeneous echotexture and a nodular border consistent with cirrhosis are identified. A small to moderate volume of abdominal ascites is seen. Blood flow within a peripheral  branch of the portal vein is reverse and the main portal vein appears thrombosed.  IVC: No abnormality visualized.  Pancreas: Visualized portion unremarkable.  Spleen: Mildly enlarged at 529 cm cubed. 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  Right Kidney: Length: 11.9 cm. Echogenicity within normal limits. No mass or hydronephrosis visualized.  Left Kidney: Length: 13.2 cm. Echogenicity within normal limits. No mass or hydronephrosis visualized.  Abdominal aorta: No aneurysm visualized.  Other findings: None.  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  Single mobile gallstone without evidence of cholecystitis. Gallbladder wall thickening is likely related to ascites.   Electronically Signed   By: Inge Rise M.D.   On: 08/09/2014 15:04    ROS Blood pressure 128/51, pulse 100, temperature 98.2 F (36.8 C), temperature source Oral, resp. rate 20, height $RemoveBe'5\' 8"'OMMrDQAQu$  (1.727 m), weight 234 lb 6.4 oz (106.323 kg), SpO2 78 %. Physical Exam Alert and oriented. Skin warm and dry. Oral mucosa is moist.   . Sclera icteric, conjunctivae is pink. Thyroid not  enlarged. No cervical lymphadenopathy. Bilateral wheezes. Heart regular rate and rhythm.  Abdomen is soft, distended. Bowel sounds are positive. No abdominal masses felt. No tenderness.  3+ edema to lower extremities.     Assessment/Plan: Alcoholic cirrhosis with ascites. Patient continues to drink. She is jaundice.  Recent hx of hematemesis: Hemoglobin stable. Possible EGD tomorrow. Needs to consider Rehab. Plan US guided paracentesis with cultures to rule out peritonitis Clear liquid diet.   SETZER,TERRI W 08/10/2014, 7:42 AM    GI attending note; Patient interviewed and examined. Patient is well known to me. She has history of alcoholic cirrhosis who has gone back to drinking again. She presents with hematemesis and cholestasis. Ultrasound reveals small amount of ascites and therefore paracenteses not performed. Doubt that we are dealing with variceal bleed. Suspect Mallory-Weiss tear or peptic ulcer disease. INR is 3.5. Will give her single dose of vitamin K and repeat study in a.m. Patient is agreeable to proceed with EGD which be performed under monitored anesthesia care. DC Furosemide. Pantoprazole 40 mg IV every 12 hours.

## 2014-08-10 NOTE — Care Management Utilization Note (Signed)
UR completed 

## 2014-08-10 NOTE — Progress Notes (Signed)
Inpatient Diabetes Program Recommendations  AACE/ADA: New Consensus Statement on Inpatient Glycemic Control (2013)  Target Ranges:  Prepandial:   less than 140 mg/dL      Peak postprandial:   less than 180 mg/dL (1-2 hours)      Critically ill patients:  140 - 180 mg/dL   Results for Marissa Figueroa, Annelle L (MRN [id number]) as of 08/10/2014 11:54  Ref. Range 08/09/2014 11:27 08/10/2014 05:24  Glucose Latest Ref Range: 70-99 mg/dL 409220 (H) 811216 (H)   Diabetes history: No Outpatient Diabetes medications: NA Current orders for Inpatient glycemic control: None  Inpatient Diabetes Program Recommendations Correction (SSI): Initial glucose 220 mg/dl on 9/144/26 at 78:2911:27 and lab glucose 216 mg/dl this morning. Noted one time dose of Solumedrol 125 mg on 4/26 at 16:50. While inpatient, please consider ordering CBGs and Novolog correction scale ACHS. HgbA1C: Please consider ordering an A1C to evaluate glycemic control over the past 2-3 months.  Thanks, Orlando PennerMarie Micaylah Bertucci, RN, MSN, CCRN, CDE Diabetes Coordinator Inpatient Diabetes Program [phone] (Team Pager from 8am to 5pm) [phone] (AP office) [phone] [facility] office)

## 2014-08-11 ENCOUNTER — Encounter (HOSPITAL_COMMUNITY)
Admission: EM | Disposition: A | Discharge status: Skilled Nursing Facility | Payer: Self-pay | Source: Home / Self Care | Attending: Internal Medicine

## 2014-08-11 DIAGNOSIS — D62 Acute posthemorrhagic anemia: Secondary | ICD-10-CM

## 2014-08-11 DIAGNOSIS — F1010 Alcohol abuse, uncomplicated: Secondary | ICD-10-CM

## 2014-08-11 DIAGNOSIS — K7031 Alcoholic cirrhosis of liver with ascites: Secondary | ICD-10-CM

## 2014-08-11 DIAGNOSIS — R0902 Hypoxemia: Secondary | ICD-10-CM

## 2014-08-11 LAB — AMMONIA: Ammonia: 26 umol/L (ref 11–32)

## 2014-08-11 LAB — COMPREHENSIVE METABOLIC PANEL
ALBUMIN: 1.7 g/dL — AB (ref 3.5–5.2)
ALK PHOS: 161 U/L — AB (ref 39–117)
ALT: 38 U/L — AB (ref 0–35)
AST: 70 U/L — AB (ref 0–37)
Anion gap: 5 (ref 5–15)
BUN: 10 mg/dL (ref 6–23)
CALCIUM: 8.3 mg/dL — AB (ref 8.4–10.5)
CO2: 31 mmol/L (ref 19–32)
Chloride: 97 mmol/L (ref 96–112)
Creatinine, Ser: 0.66 mg/dL (ref 0.50–1.10)
GFR calc non Af Amer: >90 mL/min (ref >90)
GLUCOSE: 125 mg/dL — AB (ref 70–99)
POTASSIUM: 3.6 mmol/L (ref 3.5–5.1)
Sodium: 133 mmol/L — ABNORMAL LOW (ref 135–145)
Total Bilirubin: 9 mg/dL — ABNORMAL HIGH (ref 0.3–1.2)
Total Protein: 6.6 g/dL (ref 6.0–8.3)

## 2014-08-11 LAB — CBC
HCT: 27.5 % — ABNORMAL LOW (ref 36.0–46.0)
Hemoglobin: 8.9 g/dL — ABNORMAL LOW (ref 12.0–15.0)
MCH: 34 pg (ref 26.0–34.0)
MCHC: 32.4 g/dL (ref 30.0–36.0)
MCV: 105 fL — AB (ref 78.0–100.0)
PLATELETS: 48 10*3/uL — AB (ref 150–400)
RBC: 2.62 MIL/uL — ABNORMAL LOW (ref 3.87–5.11)
RDW: 17.4 % — ABNORMAL HIGH (ref 11.5–15.5)
WBC: 10.9 10*3/uL — AB (ref 4.0–10.5)

## 2014-08-11 LAB — GLUCOSE, CAPILLARY
GLUCOSE-CAPILLARY: 128 mg/dL — AB (ref 70–99)
GLUCOSE-CAPILLARY: 104 mg/dL — AB (ref 70–99)
Glucose-Capillary: 131 mg/dL — ABNORMAL HIGH (ref 70–99)
Glucose-Capillary: 124 mg/dL — ABNORMAL HIGH (ref 70–99)

## 2014-08-11 LAB — PROTIME-INR
INR: 3.68 — ABNORMAL HIGH (ref 0.00–1.49)
Prothrombin Time: 36.8 seconds — ABNORMAL HIGH (ref 11.6–15.2)

## 2014-08-11 LAB — BILIRUBIN, DIRECT: Bilirubin, Direct: 3.3 mg/dL — ABNORMAL HIGH (ref 0.0–0.5)

## 2014-08-11 SURGERY — ESOPHAGOGASTRODUODENOSCOPY (EGD) WITH PROPOFOL
Anesthesia: Monitor Anesthesia Care

## 2014-08-11 MED ORDER — SODIUM CHLORIDE 0.9 % IV SOLN
Freq: Once | INTRAVENOUS | Status: AC
  Administered 2014-08-11: 18:00:00 via INTRAVENOUS

## 2014-08-11 NOTE — Progress Notes (Signed)
   08/10/14 2200  What Happened  Was fall witnessed? No  Was patient injured? No  Patient found on floor  Found by Staff-comment  Stated prior activity to/from bed, chair, or stretcher  Follow Up  MD notified yes  Time MD notified 2215  Family notified No- patient refusal  Additional tests No  Progress note created (see row info) Yes  Adult Fall Risk Assessment  Risk Factor Category (scoring not indicated) Fall has occurred during this admission (document High fall risk)  Adult Fall Risk Interventions  Required Bundle Interventions *See Row Information* High fall risk - low, moderate, and high requirements implemented  Additional Interventions Fall risk signage;Individualized elimination schedule;Protective devices (Comment);Secure all tubes/drains  Vitals  Temp 98 F (36.7 C)  Temp Source Oral  BP 129/60 mmHg  BP Location Right Arm  BP Method Automatic  Patient Position (if appropriate) Lying  Pulse Rate (!) 105  Pulse Rate Source Dinamap  Resp 20  Oxygen Therapy  SpO2 96 %  O2 Device Room Air  Neurological  Neuro (WDL) X  Level of Consciousness Alert  Orientation Level Oriented to person;Oriented to place;Disoriented to time  Cognition Appropriate at baseline;Follows commands;Poor attention/concentration  Speech Clear  Pupil Assessment  No  Motor Function/Sensation Assessment Grip  R Hand Grip Present  L Hand Grip Present  Neuro Symptoms Forgetful;Drowsiness  Neuro symptoms relieved by Rest;Relaxation techniques (Comment)  Neuro Additional Assessments No  Neuro Additional Assessments No  Musculoskeletal  Musculoskeletal (WDL) X  Assistive Device BSC  Generalized Weakness Yes  Weight Bearing Restrictions No  Integumentary  Integumentary (WDL) X  Skin Color Jaundice  Skin Integrity Intact  Skin Turgor Non-tenting

## 2014-08-11 NOTE — Progress Notes (Signed)
Patient ID: Marissa NicksChristine L Figueroa, female   DOB: December 18, 1955, 59 y.o.   MRN: [id number] Patient fell last night and apparently was not witnessed. She says she had to go to the BR. She says she fell backwards. She says she does not think she hit. She seems ?confused this morning.   PT/INR still  elevated. EGD for today has been cancelled. I/O last 3 completed shifts: In: 1025.5 [I.V.:975.5; IV Piggyback:50] Out: 1150 [Urine:1150]      CBC     Component Value Date/Time   WBC 10.9* 08/11/2014 0519   RBC 2.62* 08/11/2014 0519   HGB 8.9* 08/11/2014 0519   HCT 27.5* 08/11/2014 0519   PLT 48* 08/11/2014 0519   MCV 105.0* 08/11/2014 0519   MCH 34.0 08/11/2014 0519   MCHC 32.4 08/11/2014 0519   RDW 17.4* 08/11/2014 0519   LYMPHSABS 1.4 08/09/2014 1127   MONOABS 0.5 08/09/2014 1127   EOSABS 0.2 08/09/2014 1127   BASOSABS 0.0 08/09/2014 1127   Blood pressure 125/66, pulse 104, temperature 97.7 F (36.5 C), temperature source Oral, resp. rate 20, height 5\' 8"  (1.727 m), weight 234 lb 6.4 oz (106.323 kg), SpO2 92 %. Alcoholic cirrhosis, thrombocytopenia: PT/INR still elevated.  Will continue to monitor. She has had a drop in her hemoglobin. ? Confusion this am and she had a fall last night. Am going to order an Ammonia this am and a cmet. Will continue to monitor.

## 2014-08-11 NOTE — Clinical Social Work Placement (Signed)
   CLINICAL SOCIAL WORK PLACEMENT  NOTE  Date:  08/11/2014  Patient Details  Name: Marissa Figueroa MRN: [id number] Date of Birth: 01/10/1956  Clinical Social Work is seeking post-discharge placement for this patient at the Skilled  Nursing Facility level of care (*CSW will initial, date and re-position this form in  chart as items are completed):  Yes   Patient/family provided with [facility] Clinical Social Work Department's list of facilities offering this level of care within the geographic area requested by the patient (or if unable, by the patient's family).  Yes   Patient/family informed of their freedom to choose among providers that offer the needed level of care, that participate in Medicare, Medicaid or managed care program needed by the patient, have an available bed and are willing to accept the patient.  Yes   Patient/family informed of [facility]'s ownership interest in [facility] and [facility], as well as of the fact that they are under no obligation to receive care at these facilities.  PASRR submitted to EDS on 08/11/14     PASRR number received on 08/11/14     Existing PASRR number confirmed on       FL2 transmitted to all facilities in geographic area requested by pt/family on 08/11/14     FL2 transmitted to all facilities within larger geographic area on       Patient informed that his/her managed care company has contracts with or will negotiate with certain facilities, including the following:            Patient/family informed of bed offers received.  Patient chooses bed at       Physician recommends and patient chooses bed at      Patient to be transferred to   on  .  Patient to be transferred to facility by       Patient family notified on   of transfer.  Name of family member notified:        PHYSICIAN       Additional Comment:    _______________________________________________ Karn CassisStultz, Marika Mahaffy Shanaberger, LCSW 08/11/2014,  2:18 PM [phone]

## 2014-08-11 NOTE — Clinical Documentation Improvement (Signed)
Please specify the type and acuity of CHF Possible Clinical Conditions?  Chronic Systolic Congestive Heart Failure Chronic Diastolic Congestive Heart Failure Chronic Systolic & Diastolic Congestive Heart Failure Acute Systolic Congestive Heart Failure Acute Diastolic Congestive Heart Failure Acute Systolic & Diastolic Congestive Heart Failure Acute on Chronic Systolic Congestive Heart Failure Acute on Chronic Diastolic Congestive Heart Failure Acute on Chronic Systolic & Diastolic Congestive Heart Failure Other Condition Cannot Clinically Determine  Supporting Information:(As per notes) " HX of CHF"  Thank You, Nevin BloodgoodJoan B Welford Christmas, RN, BSN, CCDS,Clinical Documentation Specialist:  [phone]  [phone]=[facility] [place]- Health Information Management

## 2014-08-11 NOTE — Clinical Social Work Note (Signed)
Clinical Social Work Assessment  Patient Details  Name: Marissa Figueroa MRN: [id number] Date of Birth: 1955-11-10  Date of referral:  08/11/14               Reason for consult:  Substance Use/ETOH Abuse, Facility Placement                Permission sought to share information with:  Other Permission granted to share information::  Yes, Verbal Permission Granted  Name::     [place]::     Relationship::  child  Contact Information:     Housing/Transportation Living arrangements for the past 2 months:  Single Family Home Source of Information:  Patient, Adult Children Patient Interpreter Needed:  None Criminal Activity/Legal Involvement Pertinent to Current Situation/Hospitalization:  No - Comment as needed Significant Relationships:  Adult Children Lives with:  Self Do you feel safe going back to the place where you live?  Yes Need for family participation in patient care:  Yes (Comment) (pt will need support from family to remain sober and will need assistance once she transitions from SNF to home.)  Care giving concerns:  Pt lives alone, but has good support from daughter, Lenna Sciara.    Social Worker assessment / plan:  CSW met with pt at bedside along with pt's daughter, Lenna Sciara with permission. Pt alert and oriented and reports she lives alone. She describes her best support as her family, but also has a boyfriend that she indicates is also supportive. Pt is on disability due to cirrhosis. She reports that she started vomiting on Thursday night and had swelling. Pt reports she drank heavily for 30 years and had been sober for the past 4 years. She states she stopped cold [place] and noticed a difference in how she felt. She reports that she drank some over the weekend for the first time and doesn't want to place blame on anyone for why she did. Melissa indicated that pt has been drinking for awhile again and encouraged pt to tell the truth. Pt insists that she is telling the  truth and states that her boyfriend would not stay with her if she started drinking. Melissa also said that he is a heavy drinker, but pt interrupted and said that he stopped about 2 months ago. CSW did not complete SBIRT as pt continues to say that this was first time drinking in 4 years and she denies any drug use. CSW provided outpatient follow up resources to pt, and she accepted, but did not feel this was necessary.  PT evaluated pt today and recommendation is for SNF as pt is deconditioned and required a walker to ambulate. CSW discussed placement process, including Medicare coverage/criteria. Pt and daughter agree that this would be beneficial. Pt generally is independent with ADLs and drives. Sharyn Lull appeared to be pleased with recommendation for SNF. Pt was concerned with how long she would have to stay and Sharyn Lull assured her that it would be best to stay until staff at facility felt it was safe for her to return home and pt agreed. They request either [facility] or [place] SNF. CSW will initiate bed search and follow up.   Employment status:  Disabled (Comment on whether or not currently receiving Disability) (Receives disability) Insurance information:  Medicare, Medicaid In [place] PT Recommendations:  [place] / Referral to community resources:  Other (Comment Required), Outpatient Substance Abuse Treatment Options, [place] (AA meeting schedule)  Patient/Family's Response to care:  Pt and daughter are agreeable to short term SNF. She reports that she does not need any outpatient follow up for substance abuse.   Patient/Family's Understanding of and Emotional Response to Diagnosis, Current Treatment, and Prognosis:  Pt states, "I have to start somewhere" when asked how she felt about SNF. She did not elaborate when CSW discussed drinking and her health and provided Effects of Drinking information besides to admit she did feel better  when she did not drink at all. Pt shared that she had a lot of regret that she was a heavy drinker while raising her children, particularly because her youngest daughter is now into drugs and alcohol.   Emotional Assessment Appearance:  Appears older than stated age Attitude/Demeanor/Rapport:  Guarded Affect (typically observed):  Stable, Guarded Orientation:  Oriented to Self, Oriented to Place, Oriented to  Time, Oriented to Situation Alcohol / Substance use:  Alcohol Use Psych involvement (Current and /or in the community):  No (Comment)  Discharge Needs  Concerns to be addressed:  Discharge Planning Concerns, Substance Abuse Concerns Readmission within the last 30 days:  No Current discharge risk:  Substance Abuse, Lives alone Barriers to Discharge:  Other (will begin SNF bed search)   Salome Arnt, LCSW 08/11/2014, 2:23 PM

## 2014-08-11 NOTE — Evaluation (Signed)
Physical Therapy Evaluation Patient Details Name: Marissa Figueroa MRN: [id number] DOB: 1956/03/20 Today's Date: 08/11/2014   History of Present Illness  This is a 59 year old lady, who has a history of alcohol excess cirrhosis, and continues to drink alcohol, presented with hematemesis, several episodes that occurred 4 days ago. During that day, she estimates that she vomited in total approximately half a cupful. The following day, she had further episodes of hematemesis. In the last couple of days she has had no further episodes of hematemesis. She has had swelling of her abdomen without any pain or fever. She apparently also noticed dark blood in her stool but this has been improving. She apparently had been sober of alcohol for 3 years but began drinking again several months ago. She last drank alcohol 4 days ago when she started to have the hematemesis initially. Also, 2 days ago she had a fever of 102. She has no longer a fever. She is now being admitted for further management.  Clinical Impression   Pt was seen for evaluation and found to be awake but lethargic with slurred speech.  She is very weak and deconditioned in all 4 extremities.  She needs significant assist in transfers and now needs a walker for gait of 15' with significant instability.  She presents a very high fall risk.  I am recommending SNF at d/c.    Follow Up Recommendations SNF    Equipment Recommendations  Rolling walker with 5" wheels    Recommendations for Other Services  OT     Precautions / Restrictions Precautions Precautions: Fall Restrictions Weight Bearing Restrictions: No      Mobility  Bed Mobility Overal bed mobility: Needs Assistance Bed Mobility: Supine to Sit     Supine to sit: Min assist;HOB elevated     General bed mobility comments: pt is able to scoot hips in the bed but extremely slowly  Transfers Overall transfer level: Needs assistance Equipment used: Rolling walker (2  wheeled) Transfers: Sit to/from Stand Sit to Stand: Mod assist            Ambulation/Gait Ambulation/Gait assistance: Mod assist Ambulation Distance (Feet): 15 Feet Assistive device: Rolling walker (2 wheeled) Gait Pattern/deviations: Shuffle;Narrow base of support;Decreased stride length   Gait velocity interpretation: Below normal speed for age/gender General Gait Details: gait with walker is very unstable                   Balance Overall balance assessment: Needs assistance Sitting-balance support: No upper extremity supported;Feet supported Sitting balance-Leahy Scale: Good     Standing balance support: Bilateral upper extremity supported Standing balance-Leahy Scale: Fair                               Pertinent Vitals/Pain Pain Assessment: No/denies pain    Home Living Family/patient expects to be discharged to:: Skilled nursing facility Living Arrangements: Children                    Prior Function Level of Independence: Needs assistance   Gait / Transfers Assistance Needed: per pt, she transferred and ambulated short distances with no assistive device but has been falling  ADL's / Homemaking Assistance Needed: assist with bathing and all household activities                Extremity/Trunk Assessment   Upper Extremity Assessment: Generalized weakness  Lower Extremity Assessment: Generalized weakness (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)      Cervical / Trunk Assessment: Kyphotic  Communication   Communication:  (slurred speech)  Cognition Arousal/Alertness: Lethargic Behavior During Therapy: Flat affect Overall Cognitive Status: Within Functional Limits for tasks assessed                                    Assessment/Plan    PT Assessment Patient needs continued PT services  PT Diagnosis Difficulty walking;Generalized weakness;Abnormality of gait   PT Problem List Decreased  strength;Decreased activity tolerance;Decreased balance;Decreased mobility;Decreased knowledge of use of DME;Impaired sensation  PT Treatment Interventions DME instruction;Gait training;Functional mobility training;Therapeutic exercise   PT Goals (Current goals can be found in the Care Plan section) Acute Rehab PT Goals Patient Stated Goal: none stated PT Goal Formulation: With patient Time For Goal Achievement: 09/24/14    Frequency Min 3X/week   Barriers to discharge Decreased caregiver support pt needs full time assist and daughter is not able to provide this    Co-evaluation               End of Session Equipment Utilized During Treatment: Gait belt Activity Tolerance: Patient limited by fatigue Patient left: in chair;with call bell/phone within reach;with chair alarm set           Time: 1151-1220 PT Time Calculation (min) (ACUTE ONLY): 29 min   Charges:   PT Evaluation $Initial PT Evaluation Tier I: 1 Procedure     PT G CodesMyrlene Broker L 08/11/2014, 12:27 PM

## 2014-08-11 NOTE — Progress Notes (Signed)
Patient is requesting to go home for 2 hours. Patient told that it is a bad idea given her condition. Patient states she had formed stool earlier today. Serum ammonia is normal. EGD was postponed because INR has gone up and set of coming down. Thrombocytopenia is stable. Carpal lap the felt to be due to liver disease; doubt that she has DIC. His costs with Dr. Robb Matarrtiz. Patient will receive 2 units of FFP today and undergo EGD in a.m.

## 2014-08-11 NOTE — Progress Notes (Addendum)
[facility] PROGRESS NOTE Interim History: 59 year old with past medical history of cirrhosis due to alcohol abuse comes in for hematemesis to started 4 days prior to admission with some melanotic stools.   Assessment/Plan: Acute blood loss anemia due to Hematemesis: - His drop from 11.7 back on 07/18/2014 to 9.4 on 08/09/2014. - I will type and screen 2 units of packed red blood cells.  - EGD cancelled due to elevated INR, will d/w GI about FFP and rechecking INR in am. - I appreciate GIs assistance. - Cont Protonix  IV BID.  Alcoholic cirrhosis of liver with ascites/Bilirubinemia: - He has a MELD score of 28, abdominal ultrasound showed ascites. Started on Rocephin. - PT and INR is 3.6 she is on no oral anticoagulation. She was given a single dose of vitamin K with no improvement in INR.  Macrocytic anemia/Thrombocytopenia - Likely due to a call abuse, his platelets have been stable at 48.    Mild Leukocytosis: - She has remained afebrile she's currently on Rocephin.  Hyponatremia: - Due to secondary hyperaldosteronism, continue Aldactone. - check b-met.    ETOH abuse: - Counseling, monitor with CIWA protocol. - ETOG level than 5. Continue thiamine and folate.  Code Status: Full code.  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 Single mobile gallstone without evidence of cholecystitis. Gallbladder wall thickening is likely related to ascites.   Antibiotics:  None  HPI/Subjective: No further hematemesis. No melena.  Objective: Filed Vitals:   08/10/14 2200 08/11/14 0017 08/11/14 0219 08/11/14 0541  BP: 129/60 130/63 128/61 125/66  Pulse: 105 96 99 104  Temp: 98 F (36.7 C) 97.7 F (36.5 C) 97.9 F (36.6 C) 97.7 F (36.5 C)  TempSrc: Oral Oral Oral  Oral  Resp: _0 Height:      Weight:      SpO2: 96% 95% 93% 92%    Intake/Output Summary (Last 24 hours) at 08/11/14 0840 Last data filed at 08/11/14 0634  Gross per 24 hour  Intake  465.5 ml  Output    250 ml  Net  215.5 ml   Filed Weights   08/09/14 1105 08/10/14 0453  Weight: 103.874 kg (229 lb) 106.323 kg (234 lb 6.4 oz)    Exam:  General: Alert, awake, oriented x3, in no acute distress.  HEENT: No bruits, no goiter.  Heart: Regular rate and rhythm. Lungs: Good air movement, clear Abdomen: Soft, nontender, nondistended, positive bowel sounds.  Neuro: Grossly intact, nonfocal.   Data Reviewed: Basic Metabolic Panel:  Recent Labs Lab 08/09/14 1127 08/10/14 0524  NA 131* 134*  K 3.1* 3.7  CL 94* 97  CO2 30 32  GLUCOSE 220* 216*  BUN 8 8  CREATININE 0.68 0.66  CALCIUM 8.1* 8.2*   Liver Function Tests:  Recent Labs Lab 08/09/14 1127 08/10/14 0524  AST 91* 78*  ALT 35 38*  ALKPHOS 133* 147*  BILITOT 10.1* 8.4*  PROT 6.2 6.2  ALBUMIN 1.6* 1.5*    Recent Labs Lab 08/09/14 1127  LIPASE 69*   No results for input(s): AMMONIA in the last 168 hours. CBC:  Recent Labs Lab 08/09/14 1127 08/10/14 0524 08/11/14 0519  WBC 6.5 7.2 10.9*  NEUTROABS 4.4  --   --   HGB 9.6* 9.4* 8.9*  HCT 29.8* 28.6* 27.5*  MCV 104.9* 104.4* 105.0*  PLT 48* 48* 48*   Cardiac Enzymes: No results for input(s): CKTOTAL, CKMB, CKMBINDEX, TROPONINI in the last 168 hours. BNP (last 3 results)  Recent Labs  08/09/14 1459  BNP 178.0*    ProBNP (last 3 results) No results for input(s): PROBNP in the last 8760 hours.  CBG:  Recent Labs Lab 08/10/14 1755 08/10/14 2208 08/11/14 0732  GLUCAP 202* 170* 128*    Recent Results (from the past 240 hour(s))  Urine culture     Status: None (Preliminary result)   Collection Time: 08/09/14 11:30 AM  Result Value Ref Range Status   Specimen Description URINE, CATHETERIZED  Final   Special Requests NONE   Final   Culture   Final    Culture reincubated for better growth Performed at [facility]    Report Status PENDING  Incomplete     Studies: Dg Chest 2 View  08/09/2014   CLINICAL DATA:  Shortness of breath and weakness  EXAM: CHEST  2 VIEW  COMPARISON:  June 27, 2013  FINDINGS: There is no edema or consolidation. The heart size and pulmonary vascularity are normal. No adenopathy. There is degenerative change in the thoracic spine.  IMPRESSION: No edema or consolidation.   Electronically Signed   By: Lowella Grip III M.D.   On: 08/09/2014 15:48   US Abdomen Complete  08/09/2014   CLINICAL DATA:  Jaundice for 1 month.  Vomiting.  EXAM: ULTRASOUND ABDOMEN COMPLETE  COMPARISON:  CT abdomen with and without contrast 06/26/2012.  FINDINGS: Gallbladder: A single mobile gallstone measuring 1.5 cm is identified. The gallbladder wall appears thickened Ms. a small amount pericholecystic fluid. Sonographer reports negative Murphy's sign.  Common bile duct: Diameter: 0.7 cm  Liver: Heterogeneous echotexture and a nodular border consistent with cirrhosis are identified. A small to moderate volume of abdominal ascites is seen. Blood flow within a peripheral branch of the portal vein is reverse and the main portal vein appears thrombosed.  IVC: No abnormality visualized.  Pancreas: Visualized portion unremarkable.  Spleen: Mildly enlarged at 529 cm cubed. 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  Right Kidney: Length: 11.9 cm. Echogenicity within normal limits. No mass or hydronephrosis visualized.  Left Kidney: Length: 13.2 cm. Echogenicity within normal limits. No mass or hydronephrosis visualized.  Abdominal aorta: No aneurysm visualized.  Other findings: None.  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  Single mobile gallstone without evidence of cholecystitis. Gallbladder wall thickening is likely related to ascites.    Electronically Signed   By: Inge Rise M.D.   On: 08/09/2014 15:04   US Abdomen Limited  08/10/2014   CLINICAL DATA:  Ascites  EXAM: LIMITED ABDOMEN ULTRASOUND FOR ASCITES  TECHNIQUE: Limited ultrasound survey for ascites was performed in all four abdominal quadrants.  COMPARISON:  None.  FINDINGS: Limited ultrasound was performed of all four abdominal quadrants.  Small volume ascites was present, with the largest pocket in the right upper quadrant.  This was not considered sufficient for paracentesis.  IMPRESSION: Small volume abdominal ascites, not sufficient for paracentesis.   Electronically Signed   By: Julian Hy M.D.   On: 08/10/2014 12:59    Scheduled Meds: . calcium-vitamin D  1 tablet Oral BID  . cefTRIAXone (ROCEPHIN)  IV  1 g Intravenous Q24H  . folic acid  1 mg Oral Daily  . insulin aspart  0-5 Units Subcutaneous QHS  . insulin  aspart  0-9 Units Subcutaneous TID WC  . insulin aspart  3 Units Subcutaneous TID WC  . lactulose  10 g Oral BID  . LORazepam  0-4 mg Intravenous Q6H   Followed by  . [START ON 08/12/2014] LORazepam  0-4 mg Intravenous Q12H  . LORazepam  0.5 mg Oral 3 times per day  . multivitamin with minerals  1 tablet Oral Daily  . nicotine  21 mg Transdermal Daily  . pantoprazole (PROTONIX) IV  40 mg Intravenous Q12H  . sodium chloride  3 mL Intravenous Q12H  . spironolactone  50 mg Oral Daily  . thiamine  100 mg Oral Daily   Or  . thiamine  100 mg Intravenous Daily   Continuous Infusions: . sodium chloride 50 mL/hr at 08/10/14 2219    Time Spent: 25 min   Charlynne Cousins  [facility] Pager [phone]. If 7PM-7AM, please contact night-coverage at [email or web address], password [facility] 08/11/2014, 8:40 AM  LOS: 2 days

## 2014-08-11 NOTE — Progress Notes (Signed)
Patient was found sitting on the floor by tech. Tech and RN assisted patient back to bed. Patient reports that she "fell and bumped her bottom". Hospitalist, Conley RollsLe, was notified. Vitals are stable, will continue to be monitored.

## 2014-08-12 ENCOUNTER — Encounter (HOSPITAL_COMMUNITY)
Admission: EM | Disposition: A | Discharge status: Skilled Nursing Facility | Payer: Self-pay | Source: Home / Self Care | Attending: Internal Medicine

## 2014-08-12 LAB — CBC
HCT: 29.2 % — ABNORMAL LOW (ref 36.0–46.0)
Hemoglobin: 9.4 g/dL — ABNORMAL LOW (ref 12.0–15.0)
MCH: 34.6 pg — AB (ref 26.0–34.0)
MCHC: 32.2 g/dL (ref 30.0–36.0)
MCV: 107.4 fL — ABNORMAL HIGH (ref 78.0–100.0)
PLATELETS: 53 10*3/uL — AB (ref 150–400)
RBC: 2.72 MIL/uL — ABNORMAL LOW (ref 3.87–5.11)
RDW: 18.6 % — ABNORMAL HIGH (ref 11.5–15.5)
WBC: 6.5 10*3/uL (ref 4.0–10.5)

## 2014-08-12 LAB — GLUCOSE, CAPILLARY
GLUCOSE-CAPILLARY: 129 mg/dL — AB (ref 70–99)
GLUCOSE-CAPILLARY: 151 mg/dL — AB (ref 70–99)
GLUCOSE-CAPILLARY: 130 mg/dL — AB (ref 70–99)
Glucose-Capillary: 116 mg/dL — ABNORMAL HIGH (ref 70–99)

## 2014-08-12 LAB — URINE CULTURE

## 2014-08-12 LAB — PREPARE FRESH FROZEN PLASMA
UNIT DIVISION: 0
Unit division: 0

## 2014-08-12 LAB — BASIC METABOLIC PANEL
ANION GAP: 6 (ref 5–15)
BUN: 12 mg/dL (ref 6–23)
CALCIUM: 8.6 mg/dL (ref 8.4–10.5)
CHLORIDE: 100 mmol/L (ref 96–112)
CO2: 31 mmol/L (ref 19–32)
CREATININE: 0.68 mg/dL (ref 0.50–1.10)
GFR calc Af Amer: >90 mL/min (ref >90)
GLUCOSE: 118 mg/dL — AB (ref 70–99)
POTASSIUM: 3.3 mmol/L — AB (ref 3.5–5.1)
Sodium: 137 mmol/L (ref 135–145)

## 2014-08-12 LAB — PROTIME-INR
INR: 2.72 — ABNORMAL HIGH (ref 0.00–1.49)
Prothrombin Time: 29 seconds — ABNORMAL HIGH (ref 11.6–15.2)

## 2014-08-12 SURGERY — ESOPHAGOGASTRODUODENOSCOPY (EGD) WITH PROPOFOL
Anesthesia: Monitor Anesthesia Care

## 2014-08-12 MED ORDER — PANTOPRAZOLE SODIUM 40 MG PO TBEC
40.0000 mg | DELAYED_RELEASE_TABLET | Freq: Two times a day (BID) | ORAL | Status: DC
  Administered 2014-08-12 – 2014-08-18 (×12): 40 mg via ORAL
  Filled 2014-08-12 (×12): qty 1

## 2014-08-12 NOTE — Clinical Social Work Placement (Signed)
   CLINICAL SOCIAL WORK PLACEMENT  NOTE  Date:  08/12/2014  Patient Details  Name: Marissa Figueroa MRN: [id number] Date of Birth: 09-03-1955  Clinical Social Work is seeking post-discharge placement for this patient at the Skilled  Nursing Facility level of care (*CSW will initial, date and re-position this form in  chart as items are completed):  Yes   Patient/family provided with [facility] Clinical Social Work Department's list of facilities offering this level of care within the geographic area requested by the patient (or if unable, by the patient's family).  Yes   Patient/family informed of their freedom to choose among providers that offer the needed level of care, that participate in Medicare, Medicaid or managed care program needed by the patient, have an available bed and are willing to accept the patient.  Yes   Patient/family informed of [facility]'s ownership interest in [facility] and [facility], as well as of the fact that they are under no obligation to receive care at these facilities.  PASRR submitted to EDS on 08/11/14     PASRR number received on 08/11/14     Existing PASRR number confirmed on       FL2 transmitted to all facilities in geographic area requested by pt/family on 08/11/14     FL2 transmitted to all facilities within larger geographic area on       Patient informed that his/her managed care company has contracts with or will negotiate with certain facilities, including the following:        Yes   Patient/family informed of bed offers received.  [facility]     Physician recommends and patient chooses bed at      Patient to be transferred to   on  .  Patient to be transferred to facility by       Patient family notified on   of transfer.  Name of family member notified:        PHYSICIAN       Additional Comment:    _______________________________________________ Karn CassisStultz, Rodriquez Thorner [place]aberger,  LCSW 08/12/2014, 11:12 AM [phone]

## 2014-08-12 NOTE — Progress Notes (Signed)
Patient was given her ativan last night for bed. She slept most of the night. She had to have her IV restarted in the middle of the night due to it leaking. When she woke up this morning, she refused lab work, vitals, oxygen, and morning meds (Ativan). Patient was educated on importance of all these interventions and she did not seem to comprehend the situation. Patient was combative when staff tried to help her to the bed side commode and she is very unsteady. MD notified and patient will continue to be monitored. Sitter at bedside.

## 2014-08-12 NOTE — OR Nursing (Signed)
Procedure cancelled for now.  Per Dr. Karilyn Cotaehman she is confused and questioned  that she might be going into DT,s.

## 2014-08-12 NOTE — Progress Notes (Signed)
[facility] PROGRESS NOTE Interim History: 59 year old with past medical history of cirrhosis due to alcohol abuse comes in for hematemesis to started 4 days prior to admission with some melanotic stools.   Assessment/Plan: Acute blood loss anemia due to Hematemesis: - His drop from 11.7 back on 07/18/2014 to 9.4 on 08/09/2014. - I will type and screen 2 units of packed red blood cells.  - EGD cancelled as patient refused it. She got 2 units of FFP, she would not let them draw labs. - I appreciate GIs assistance. - Cont Protonix  IV BID. She will let signed herself out of the hospital [place]. - More than 35 minutes were spent with the patient talking about the risk and benefits of leaving without the EGD.  Alcoholic cirrhosis of liver with ascites/Bilirubinemia: - He has a MELD score of 28, abdominal ultrasound showed ascites. Started on Rocephin 4.26.2016. - PT and INR is 3.6 she is on no oral anticoagulation.   Macrocytic anemia/Thrombocytopenia: - Likely due to a call abuse, his platelets have been stable at 48.    Mild Leukocytosis: - She has remained afebrile she's currently on Rocephin. - Urine culture grew more than 100,000 colonies of gram-negative rods.  Hyponatremia: - Due to secondary hyperaldosteronism, continue Aldactone. - check b-met.    ETOH abuse: - Counseling, monitor with CIWA protocol, sclerosed 2 or less. - ETOG level than 5. Continue thiamine and folate.  Code Status: Full code.  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 Single mobile gallstone without evidence of cholecystitis. Gallbladder wall thickening is likely related to ascites.   Antibiotics:  None  HPI/Subjective: No further hematemesis. No melena. She mostly  the hospital she says she needs to smoke.  Objective: Filed Vitals:   08/11/14 1822 08/11/14 1945 08/11/14 2025 08/11/14 2230  BP: 117/55 116/46 128/83 123/54  Pulse: 100 99 94 103  Temp: 97.9 F (36.6 C) 97.5 F (36.4 C) 98 F (36.7 C) 98.3 F (36.8 C)  TempSrc: Oral Oral Oral Oral  Resp: $Remo'18 20 20 20  'qpHeR$ Height:      Weight:      SpO2: 92% 94% 96% 99%    Intake/Output Summary (Last 24 hours) at 08/12/14 0837 Last data filed at 08/12/14 0536  Gross per 24 hour  Intake 1418.17 ml  Output   1300 ml  Net 118.17 ml   Filed Weights   08/09/14 1105 08/10/14 0453  Weight: 103.874 kg (229 lb) 106.323 kg (234 lb 6.4 oz)    Exam:  General: Alert, awake, oriented x3, in no acute distress.  HEENT: No bruits, no goiter.  Heart: Regular rate and rhythm. Lungs: Good air movement, clear Abdomen: Soft, nontender, nondistended, positive bowel sounds.  Neuro: Grossly intact, nonfocal.   Data Reviewed: Basic Metabolic Panel:  Recent Labs Lab 08/09/14 1127 08/10/14 0524 08/11/14 0858  NA 131* 134* 133*  K 3.1* 3.7 3.6  CL 94* 97 97  CO2 30 32 31  GLUCOSE 220* 216* 125*  BUN $Re'8 8 10  'TQt$ CREATININE 0.68 0.66 0.66  CALCIUM 8.1* 8.2* 8.3*   Liver Function Tests:  Recent Labs Lab 08/09/14 1127 08/10/14 0524 08/11/14 0858  AST 91* 78* 70*  ALT 35 38* 38*  ALKPHOS 133* 147* 161*  BILITOT 10.1* 8.4* 9.0*  PROT 6.2 6.2  6.6  ALBUMIN 1.6* 1.5* 1.7*    Recent Labs Lab 08/09/14 1127  LIPASE 69*    Recent Labs Lab 08/11/14 0858  AMMONIA 26   CBC:  Recent Labs Lab 08/09/14 1127 08/10/14 0524 08/11/14 0519  WBC 6.5 7.2 10.9*  NEUTROABS 4.4  --   --   HGB 9.6* 9.4* 8.9*  HCT 29.8* 28.6* 27.5*  MCV 104.9* 104.4* 105.0*  PLT 48* 48* 48*   Cardiac Enzymes: No results for input(s): CKTOTAL, CKMB, CKMBINDEX, TROPONINI in the last 168 hours. BNP (last 3 results)  Recent Labs  08/09/14 1459  BNP 178.0*    ProBNP (last 3 results) No results for input(s): PROBNP  in the last 8760 hours.  CBG:  Recent Labs Lab 08/11/14 0732 08/11/14 1156 08/11/14 1634 08/11/14 2106 08/12/14 0731  GLUCAP 128* 131* 124* 104* 129*    Recent Results (from the past 240 hour(s))  Urine culture     Status: None (Preliminary result)   Collection Time: 08/09/14 11:30 AM  Result Value Ref Range Status   Specimen Description URINE, CATHETERIZED  Final   Special Requests NONE  Final   Colony Count   Final    >=100,000 COLONIES/ML Performed at [facility]    Culture   Final    [place] Performed at [facility]    Report Status PENDING  Incomplete     Studies: US Abdomen Limited  08/10/2014   CLINICAL DATA:  Ascites  EXAM: LIMITED ABDOMEN ULTRASOUND FOR ASCITES  TECHNIQUE: Limited ultrasound survey for ascites was performed in all four abdominal quadrants.  COMPARISON:  None.  FINDINGS: Limited ultrasound was performed of all four abdominal quadrants.  Small volume ascites was present, with the largest pocket in the right upper quadrant.  This was not considered sufficient for paracentesis.  IMPRESSION: Small volume abdominal ascites, not sufficient for paracentesis.   Electronically Signed   By: Julian Hy M.D.   On: 08/10/2014 12:59    Scheduled Meds: . calcium-vitamin D  1 tablet Oral BID  . cefTRIAXone (ROCEPHIN)  IV  1 g Intravenous Q24H  . folic acid  1 mg Oral Daily  . insulin aspart  0-5 Units Subcutaneous QHS  . insulin aspart  0-9 Units Subcutaneous TID WC  . insulin aspart  3 Units Subcutaneous TID WC  . lactulose  10 g Oral BID  . LORazepam  0-4 mg Intravenous Q6H   Followed by  . LORazepam  0-4 mg Intravenous Q12H  . LORazepam  0.5 mg Oral 3 times per day  . multivitamin with minerals  1 tablet Oral Daily  . nicotine  21 mg Transdermal Daily  . pantoprazole  40 mg Oral BID AC  . sodium chloride  3 mL Intravenous Q12H  . spironolactone  50 mg Oral Daily  . thiamine  100 mg Oral Daily   Or  . thiamine  100  mg Intravenous Daily   Continuous Infusions: . sodium chloride 50 mL/hr at 08/11/14 1803    Time Spent: 35 min   Charlynne Cousins  [facility] Pager [phone]. If 7PM-7AM, please contact night-coverage at [email or web address], password [facility] 08/12/2014, 8:37 AM  LOS: 3 days

## 2014-08-12 NOTE — Progress Notes (Signed)
Pt has symptoms of alcohol withdrawal. She is currently sleeping. CSW spoke with pt's daughter Marcelino DusterMichelle and provided bed offers. [facility] aware of above and of possible d/c Sunday.   Derenda FennelKara French Kendra, [place] [phone]

## 2014-08-12 NOTE — Progress Notes (Signed)
Lab results noted from this morning. INR has improved from 3.68 to 2.72. Patient received 2 units of PRBCs and 10 mg of vitamin K subcutaneous 2 days ago. Hemoglobin was 8.9 yesterday and 9.4 today. Patient's condition discussed with her best friend who is at bedside. Will plan EGD next week.

## 2014-08-12 NOTE — Progress Notes (Signed)
Patient has been confused. She refused have her blood drawn. Appears patient is having symptoms of alcohol withdrawal. Procedure therefore canceled. Patient begun on modified carb diet.

## 2014-08-13 ENCOUNTER — Inpatient Hospital Stay (HOSPITAL_COMMUNITY): Payer: Medicare Other

## 2014-08-13 LAB — BASIC METABOLIC PANEL
Anion gap: 5 (ref 5–15)
BUN: 14 mg/dL (ref 6–23)
CO2: 31 mmol/L (ref 19–32)
Calcium: 8.3 mg/dL — ABNORMAL LOW (ref 8.4–10.5)
Chloride: 100 mmol/L (ref 96–112)
Creatinine, Ser: 0.68 mg/dL (ref 0.50–1.10)
Glucose, Bld: 115 mg/dL — ABNORMAL HIGH (ref 70–99)
POTASSIUM: 3.1 mmol/L — AB (ref 3.5–5.1)
SODIUM: 136 mmol/L (ref 135–145)

## 2014-08-13 LAB — GLUCOSE, CAPILLARY
GLUCOSE-CAPILLARY: 102 mg/dL — AB (ref 70–99)
Glucose-Capillary: 118 mg/dL — ABNORMAL HIGH (ref 70–99)
Glucose-Capillary: 161 mg/dL — ABNORMAL HIGH (ref 70–99)
Glucose-Capillary: 122 mg/dL — ABNORMAL HIGH (ref 70–99)

## 2014-08-13 MED ORDER — LORAZEPAM 2 MG/ML IJ SOLN
1.0000 mg | INTRAMUSCULAR | Status: AC
  Administered 2014-08-13: 1 mg via INTRAVENOUS

## 2014-08-13 MED ORDER — SPIRONOLACTONE 25 MG PO TABS
100.0000 mg | ORAL_TABLET | Freq: Every day | ORAL | Status: DC
  Administered 2014-08-13 – 2014-08-18 (×6): 100 mg via ORAL
  Filled 2014-08-13 (×6): qty 4

## 2014-08-13 NOTE — Progress Notes (Signed)
[facility] PROGRESS NOTE Interim History: 59 year old with past medical history of cirrhosis due to alcohol abuse comes in for hematemesis to started 4 days prior to admission with some melanotic stools.   Assessment/Plan: Acute blood loss anemia due to Hematemesis: - His drop from 11.7 back on 07/18/2014 to 9.4 on 08/09/2014. - EGD on 5.1.2016. She got 2 units of FFP, INR came down to 2.7. - I appreciate GIs assistance. - Cont Protonix  IV BID.   Alcoholic cirrhosis of liver with ascites/Bilirubinemia: - He has a MELD score of 28, abdominal ultrasound showed ascites. Started on Rocephin 4.26.2016. - PT and INR is 2.7 she is on no oral anticoagulation. Likely due to liver disease.  Acute low back pain: - new after a fall. - check MRI.  Macrocytic anemia/Thrombocytopenia: - Likely due to a call abuse, his platelets have been stable at 48.    Mild Leukocytosis: - She has remained afebrile she's currently on Rocephin. - Urine culture grew more than 100,000 colonies of gram-negative rods.  Hyponatremia: - Due to secondary hyperaldosteronism, continue Aldactone. Resolved.    ETOH abuse: - Counseling, monitor with CIWA protocol, sclerosed 2 or less. - ETOG level than 5. Continue thiamine and folate.  Code Status: Full code.  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 Single mobile gallstone without evidence of cholecystitis. Gallbladder wall thickening is likely related to ascites.   Antibiotics:  None  HPI/Subjective: No further hematemesis. No melena. She mostly the hospital she says she needs to smoke.  Objective: Filed Vitals:   08/12/14 1332 08/12/14 2219 08/13/14 0203 08/13/14 0556  BP: 110/74 116/52  116/46  Pulse: 101 94 92 99  Temp: 98 F (36.7  C) 97.5 F (36.4 C) 98 F (36.7 C) 98.1 F (36.7 C)  TempSrc: Oral Oral Oral Oral  Resp: 20 20 18 18   Height:      Weight:      SpO2: 94% 91% 85% 88%    Intake/Output Summary (Last 24 hours) at 08/13/14 0907 Last data filed at 08/13/14 0859  Gross per 24 hour  Intake   2523 ml  Output   1225 ml  Net   1298 ml   Filed Weights   08/09/14 1105 08/10/14 0453  Weight: 103.874 kg (229 lb) 106.323 kg (234 lb 6.4 oz)    Exam:  General: Alert, awake, oriented x3, in no acute distress.  HEENT: No bruits, no goiter.  Heart: Regular rate and rhythm. Lungs: Good air movement, clear Abdomen: Soft, nontender, nondistended, positive bowel sounds.  Neuro: Grossly intact, nonfocal.straight leg raise positive.   Data Reviewed: Basic Metabolic Panel:  Recent Labs Lab 08/09/14 1127 08/10/14 0524 08/11/14 0858 08/12/14 0837 08/13/14 0545  NA 131* 134* 133* 137 136  K 3.1* 3.7 3.6 3.3* 3.1*  CL 94* 97 97 100 100  CO2 30 32 31 31 31   GLUCOSE 220* 216* 125* 118* 115*  BUN 8 8 10 12 14   CREATININE 0.68 0.66 0.66 0.68 0.68  CALCIUM 8.1* 8.2* 8.3* 8.6 8.3*   Liver Function Tests:  Recent Labs Lab 08/09/14 1127 08/10/14 0524 08/11/14 0858  AST 91* 78* 70*  ALT 35 38* 38*  ALKPHOS 133* 147* 161*  BILITOT 10.1* 8.4* 9.0*  PROT 6.2 6.2 6.6  ALBUMIN 1.6* 1.5* 1.7*  Recent Labs Lab 08/09/14 1127  LIPASE 69*    Recent Labs Lab 08/11/14 0858  AMMONIA 26   CBC:  Recent Labs Lab 08/09/14 1127 08/10/14 0524 08/11/14 0519 08/12/14 0839  WBC 6.5 7.2 10.9* 6.5  NEUTROABS 4.4  --   --   --   HGB 9.6* 9.4* 8.9* 9.4*  HCT 29.8* 28.6* 27.5* 29.2*  MCV 104.9* 104.4* 105.0* 107.4*  PLT 48* 48* 48* 53*   Cardiac Enzymes: No results for input(s): CKTOTAL, CKMB, CKMBINDEX, TROPONINI in the last 168 hours. BNP (last 3 results)  Recent Labs  08/09/14 1459  BNP 178.0*    ProBNP (last 3 results) No results for input(s): PROBNP in the last 8760  hours.  CBG:  Recent Labs Lab 08/12/14 0731 08/12/14 1117 08/12/14 1650 08/12/14 2046 08/13/14 0719  GLUCAP 129* 151* 116* 130* 118*    Recent Results (from the past 240 hour(s))  Urine culture     Status: None   Collection Time: 08/09/14 11:30 AM  Result Value Ref Range Status   Specimen Description URINE, CATHETERIZED  Final   Special Requests NONE  Final   Colony Count   Final    >=100,000 COLONIES/ML Performed at [facility]    Culture   Final    ESCHERICHIA COLI Performed at [facility]    Report Status 08/12/2014 FINAL  Final   Organism ID, Bacteria ESCHERICHIA COLI  Final      Susceptibility   Escherichia coli - MIC*    AMPICILLIN >=32 RESISTANT Resistant     CEFAZOLIN <=4 SENSITIVE Sensitive     CEFTRIAXONE <=1 SENSITIVE Sensitive     CIPROFLOXACIN <=0.25 SENSITIVE Sensitive     GENTAMICIN <=1 SENSITIVE Sensitive     LEVOFLOXACIN <=0.12 SENSITIVE Sensitive     NITROFURANTOIN <=16 SENSITIVE Sensitive     TOBRAMYCIN <=1 SENSITIVE Sensitive     TRIMETH/SULFA <=20 SENSITIVE Sensitive     PIP/TAZO <=4 SENSITIVE Sensitive     * ESCHERICHIA COLI     Studies: No results found.  Scheduled Meds: . calcium-vitamin D  1 tablet Oral BID  . cefTRIAXone (ROCEPHIN)  IV  1 g Intravenous Q24H  . folic acid  1 mg Oral Daily  . insulin aspart  0-5 Units Subcutaneous QHS  . insulin aspart  0-9 Units Subcutaneous TID WC  . insulin aspart  3 Units Subcutaneous TID WC  . lactulose  10 g Oral BID  . LORazepam  0-4 mg Intravenous Q12H  . LORazepam  0.5 mg Oral 3 times per day  . multivitamin with minerals  1 tablet Oral Daily  . nicotine  21 mg Transdermal Daily  . pantoprazole  40 mg Oral BID AC  . sodium chloride  3 mL Intravenous Q12H  . spironolactone  50 mg Oral Daily  . thiamine  100 mg Oral Daily   Or  . thiamine  100 mg Intravenous Daily   Continuous Infusions: . sodium chloride 50 mL/hr at 08/12/14 1438    Time Spent: 35 min   Marinda Elk  [facility] Pager [phone]. If 7PM-7AM, please contact night-coverage at [email or web address], password [facility] 08/13/2014, 9:07 AM  LOS: 4 days

## 2014-08-14 LAB — PROTIME-INR
INR: 3.24 — ABNORMAL HIGH (ref 0.00–1.49)
PROTHROMBIN TIME: 33.3 s — AB (ref 11.6–15.2)

## 2014-08-14 LAB — GLUCOSE, CAPILLARY
GLUCOSE-CAPILLARY: 90 mg/dL (ref 70–99)
GLUCOSE-CAPILLARY: 100 mg/dL — AB (ref 70–99)
GLUCOSE-CAPILLARY: 142 mg/dL — AB (ref 70–99)
Glucose-Capillary: 108 mg/dL — ABNORMAL HIGH (ref 70–99)

## 2014-08-14 MED ORDER — HYDROCODONE-ACETAMINOPHEN 5-325 MG PO TABS
1.0000 | ORAL_TABLET | Freq: Four times a day (QID) | ORAL | Status: DC | PRN
  Administered 2014-08-14 (×2): 1 via ORAL
  Filled 2014-08-14 (×2): qty 1

## 2014-08-14 MED ORDER — SODIUM CHLORIDE 0.9 % IV SOLN
Freq: Once | INTRAVENOUS | Status: AC
  Administered 2014-08-14: 12:00:00 via INTRAVENOUS

## 2014-08-14 NOTE — Progress Notes (Signed)
[facility] PROGRESS NOTE Interim History: 59 year old with past medical history of cirrhosis due to alcohol abuse comes in for hematemesis to started 4 days prior to admission with some melanotic stools. She refused blood drawn and her EGD on 08/12/2014. After thinking about it she agreed to proceed with EGD on 5.2. 2016   Assessment/Plan: Acute blood loss anemia due to Hematemesis: - His drop from 11.7 back on 07/18/2014 to 9.4 on 08/09/2014. Loratadine check a CBC in the morning, she has had no further melanotic stools or hematemesis. - HD was originally scheduled for 08/12/2014 and she refused. After discussing with her and she has agreed to proceed with EGD. - EGD on 5.1.2016. She got 2 units of FFP, INR came down to 2.7, now INR trending up for repeat FFP for procedure in the morning. - I appreciate GIs assistance. - Cont Protonix  IV BID.   Alcoholic cirrhosis of liver with ascites/Bilirubinemia: - He has a MELD score of 28, abdominal ultrasound showed ascites. Started on Rocephin empirically 4.26.2016. For SBP. - PT and INR is 3.4 she is on not oral anticoagulation. Likely due to liver disease.  Acute low back pain: - MRI as below,She has no focal deficits. The only thing that she complains about his pain. - PT consult  Macrocytic anemia/Thrombocytopenia: - Likely due to a call abuse, her platelets are trending up.    Mild Leukocytosis: - She has remained afebrile she's currently on Rocephin. - Urine culture grew Escherichia coli sensitive to Rocephin.  Hyponatremia: - Due to secondary hyperaldosteronism, continue Aldactone. Resolved.    ETOH abuse: - No signs of withdrawal, continue to monitor with CIWA. - ETOG level than 5. Continue thiamine and folate.  Code Status: Full code.  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 Single mobile gallstone without evidence of cholecystitis. Gallbladder wall thickening is likely related to ascites. MRI 4.30.2016: L4-5 level. There is an acute appearing broad-based disc herniation. There is stenosis of the lateral recesses  Antibiotics:  None  HPI/Subjective: No further hematemesis. No melena. No complains  Objective: Filed Vitals:   08/13/14 1352 08/13/14 2046 08/14/14 0139 08/14/14 0650  BP: 131/47 119/52 114/57 104/50  Pulse: 93 85 88 85  Temp: 98 F (36.7 C) 98.2 F (36.8 C) 98.2 F (36.8 C) 98.1 F (36.7 C)  TempSrc: Oral Oral Oral Oral  Resp: Height:      Weight:      SpO2: 90% 92% 93% 92%    Intake/Output Summary (Last 24 hours) at 08/14/14 0821 Last data filed at 08/14/14 0630  Gross per 24 hour  Intake    880 ml  Output   1550 ml  Net   -670 ml   Filed Weights   08/09/14 1105 08/10/14 0453  Weight: 103.874 kg (229 lb) 106.323 kg (234 lb 6.4 oz)    Exam:  General: Alert, awake, oriented x3, in no acute distress.  HEENT: No bruits, no goiter.  Heart: Regular rate and rhythm. Lungs: Good air movement, clear Abdomen: Soft, nontender, nondistended, positive bowel sounds.  Neuro: Grossly intact, nonfocal.straight leg raise positive.   Data Reviewed: Basic Metabolic Panel:  Recent Labs Lab 08/09/14 1127 08/10/14 0524 08/11/14 0858 08/12/14 0837 08/13/14 0545  NA 131* 134* 133* 137 136  K 3.1*  3.7 3.6 3.3* 3.1*  CL 94* 97 97 100 100  CO2 30 32 31 31 31   GLUCOSE 220* 216* 125* 118* 115*  BUN 8 8 10 12 14   CREATININE 0.68 0.66 0.66 0.68 0.68  CALCIUM 8.1* 8.2* 8.3* 8.6 8.3*   Liver Function Tests:  Recent Labs Lab 08/09/14 1127 08/10/14 0524 08/11/14 0858  AST 91* 78* 70*  ALT 35 38* 38*  ALKPHOS 133* 147* 161*  BILITOT 10.1* 8.4* 9.0*  PROT 6.2 6.2 6.6  ALBUMIN 1.6* 1.5* 1.7*    Recent Labs Lab 08/09/14 1127  LIPASE 69*     Recent Labs Lab 08/11/14 0858  AMMONIA 26   CBC:  Recent Labs Lab 08/09/14 1127 08/10/14 0524 08/11/14 0519 08/12/14 0839  WBC 6.5 7.2 10.9* 6.5  NEUTROABS 4.4  --   --   --   HGB 9.6* 9.4* 8.9* 9.4*  HCT 29.8* 28.6* 27.5* 29.2*  MCV 104.9* 104.4* 105.0* 107.4*  PLT 48* 48* 48* 53*   Cardiac Enzymes: No results for input(s): CKTOTAL, CKMB, CKMBINDEX, TROPONINI in the last 168 hours. BNP (last 3 results)  Recent Labs  08/09/14 1459  BNP 178.0*    ProBNP (last 3 results) No results for input(s): PROBNP in the last 8760 hours.  CBG:  Recent Labs Lab 08/13/14 0719 08/13/14 1110 08/13/14 1631 08/13/14 2036 08/14/14 0748  GLUCAP 118* 161* 102* 122* 108*    Recent Results (from the past 240 hour(s))  Urine culture     Status: None   Collection Time: 08/09/14 11:30 AM  Result Value Ref Range Status   Specimen Description URINE, CATHETERIZED  Final   Special Requests NONE  Final   Colony Count   Final    >=100,000 COLONIES/ML [facility]formed at [facility]    Culture   Final    ESCHERICHIA COLI Performed at [facility]    Report Status 08/12/2014 FINAL  Final   Organism ID, Bacteria ESCHERICHIA COLI  Final      Susceptibility   Escherichia coli - MIC*    AMPICILLIN >=32 RESISTANT Resistant     CEFAZOLIN <=4 SENSITIVE Sensitive     CEFTRIAXONE <=1 SENSITIVE Sensitive     CIPROFLOXACIN <=0.25 SENSITIVE Sensitive     GENTAMICIN <=1 SENSITIVE Sensitive     LEVOFLOXACIN <=0.12 SENSITIVE Sensitive     NITROFURANTOIN <=16 SENSITIVE Sensitive     TOBRAMYCIN <=1 SENSITIVE Sensitive     TRIMETH/SULFA <=20 SENSITIVE Sensitive     PIP/TAZO <=4 SENSITIVE Sensitive     * ESCHERICHIA COLI     Studies: Mr Lumbar Spine Wo Contrast  08/13/2014   CLINICAL DATA:  Larey SeatFell several days ago. Low back pain. Right hip and leg pain.  EXAM: MRI LUMBAR SPINE WITHOUT CONTRAST  TECHNIQUE: Multiplanar, multisequence MR imaging of the lumbar spine was performed. No  intravenous contrast was administered.  COMPARISON:  None.  FINDINGS: The study suffers from motion degradation. There is no abnormality at L1-2 or above. The distal cord and conus are normal with conus tip at L1.  L2-3: Mild desiccation and bulging of the disc. Mild facet and ligamentous hypertrophy. No compressive stenosis.  L3-4: Moderate circumferential bulging of the disc. Mild facet and ligamentous hypertrophy. Mild stenosis of the canal, lateral recesses and foramina without definite neural compression.  L4-5: Acute appearing broad-based disc herniation. Mild facet and ligamentous hypertrophy. Spinal stenosis with potential for neural compression on either or both sides.  L5-S1: No disc pathology.  Mild facet degeneration.  No stenosis.  IMPRESSION: The significant findings are probably at the L4-5 level. There is an acute appearing broad-based disc herniation. There is stenosis of the lateral recesses that could cause neural compression on either or both sides.   Electronically Signed   By: Paulina Fusi M.D.   On: 08/13/2014 10:57    Scheduled Meds: . calcium-vitamin D  1 tablet Oral BID  . cefTRIAXone (ROCEPHIN)  IV  1 g Intravenous Q24H  . folic acid  1 mg Oral Daily  . insulin aspart  0-5 Units Subcutaneous QHS  . insulin aspart  0-9 Units Subcutaneous TID WC  . insulin aspart  3 Units Subcutaneous TID WC  . lactulose  10 g Oral BID  . LORazepam  0-4 mg Intravenous Q12H  . LORazepam  0.5 mg Oral 3 times per day  . multivitamin with minerals  1 tablet Oral Daily  . nicotine  21 mg Transdermal Daily  . pantoprazole  40 mg Oral BID AC  . sodium chloride  3 mL Intravenous Q12H  . spironolactone  100 mg Oral Daily  . thiamine  100 mg Oral Daily   Or  . thiamine  100 mg Intravenous Daily   Continuous Infusions: . sodium chloride 50 mL/hr at 08/13/14 1140    Time Spent: 35 min   Marinda Elk  [facility] Pager [phone]. If 7PM-7AM, please contact night-coverage at  [email or web address], password [facility] 08/14/2014, 8:21 AM  LOS: 5 days

## 2014-08-15 ENCOUNTER — Encounter (HOSPITAL_COMMUNITY): Payer: Self-pay | Admitting: *Deleted

## 2014-08-15 ENCOUNTER — Inpatient Hospital Stay (HOSPITAL_COMMUNITY): Payer: Medicare Other | Admitting: Anesthesiology

## 2014-08-15 ENCOUNTER — Encounter (HOSPITAL_COMMUNITY)
Admission: EM | Disposition: A | Discharge status: Skilled Nursing Facility | Payer: Self-pay | Source: Home / Self Care | Attending: Internal Medicine

## 2014-08-15 DIAGNOSIS — K920 Hematemesis: Secondary | ICD-10-CM | POA: Diagnosis not present

## 2014-08-15 DIAGNOSIS — N390 Urinary tract infection, site not specified: Secondary | ICD-10-CM

## 2014-08-15 DIAGNOSIS — K7031 Alcoholic cirrhosis of liver with ascites: Secondary | ICD-10-CM | POA: Diagnosis not present

## 2014-08-15 DIAGNOSIS — D62 Acute posthemorrhagic anemia: Secondary | ICD-10-CM

## 2014-08-15 DIAGNOSIS — R188 Other ascites: Secondary | ICD-10-CM

## 2014-08-15 DIAGNOSIS — K3189 Other diseases of stomach and duodenum: Secondary | ICD-10-CM | POA: Diagnosis not present

## 2014-08-15 HISTORY — PX: ESOPHAGOGASTRODUODENOSCOPY (EGD) WITH PROPOFOL: SHX5813

## 2014-08-15 LAB — PREPARE FRESH FROZEN PLASMA
Unit division: 0
Unit division: 0

## 2014-08-15 LAB — CBC
HCT: 26.7 % — ABNORMAL LOW (ref 36.0–46.0)
HEMOGLOBIN: 8.5 g/dL — AB (ref 12.0–15.0)
MCH: 34.7 pg — AB (ref 26.0–34.0)
MCHC: 31.8 g/dL (ref 30.0–36.0)
MCV: 109 fL — AB (ref 78.0–100.0)
Platelets: 37 10*3/uL — ABNORMAL LOW (ref 150–400)
RBC: 2.45 MIL/uL — AB (ref 3.87–5.11)
RDW: 19.3 % — ABNORMAL HIGH (ref 11.5–15.5)
WBC: 3.7 10*3/uL — ABNORMAL LOW (ref 4.0–10.5)

## 2014-08-15 LAB — PROTIME-INR
INR: 2.79 — ABNORMAL HIGH (ref 0.00–1.49)
PROTHROMBIN TIME: 29.7 s — AB (ref 11.6–15.2)

## 2014-08-15 LAB — BASIC METABOLIC PANEL
ANION GAP: 5 (ref 5–15)
BUN: 10 mg/dL (ref 6–20)
CALCIUM: 8 mg/dL — AB (ref 8.9–10.3)
CHLORIDE: 104 mmol/L (ref 101–111)
CO2: 30 mmol/L (ref 22–32)
CREATININE: 0.68 mg/dL (ref 0.44–1.00)
GFR calc Af Amer: >60 mL/min (ref >60)
GFR calc non Af Amer: >60 mL/min (ref >60)
Glucose, Bld: 110 mg/dL — ABNORMAL HIGH (ref 70–99)
Potassium: 3.2 mmol/L — ABNORMAL LOW (ref 3.5–5.1)
Sodium: 139 mmol/L (ref 135–145)

## 2014-08-15 LAB — GLUCOSE, CAPILLARY
GLUCOSE-CAPILLARY: 112 mg/dL — AB (ref 70–99)
GLUCOSE-CAPILLARY: 88 mg/dL (ref 70–99)
Glucose-Capillary: 85 mg/dL (ref 70–99)
Glucose-Capillary: 105 mg/dL — ABNORMAL HIGH (ref 70–99)
Glucose-Capillary: 108 mg/dL — ABNORMAL HIGH (ref 70–99)

## 2014-08-15 SURGERY — ESOPHAGOGASTRODUODENOSCOPY (EGD) WITH PROPOFOL
Anesthesia: Monitor Anesthesia Care

## 2014-08-15 MED ORDER — SIMETHICONE 40 MG/0.6ML PO SUSP
ORAL | Status: DC | PRN
  Administered 2014-08-15: 1000 mL

## 2014-08-15 MED ORDER — GLYCOPYRROLATE 0.2 MG/ML IJ SOLN
0.2000 mg | Freq: Once | INTRAMUSCULAR | Status: AC
  Administered 2014-08-15: 0.2 mg via INTRAVENOUS
  Filled 2014-08-15: qty 1

## 2014-08-15 MED ORDER — GLYCOPYRROLATE 0.2 MG/ML IJ SOLN
INTRAMUSCULAR | Status: AC
  Filled 2014-08-15: qty 1

## 2014-08-15 MED ORDER — SODIUM CHLORIDE 0.9 % IV SOLN: INTRAVENOUS | Status: DC

## 2014-08-15 MED ORDER — FENTANYL CITRATE (PF) 100 MCG/2ML IJ SOLN
25.0000 ug | INTRAMUSCULAR | Status: AC
  Administered 2014-08-15 (×2): 25 ug via INTRAVENOUS

## 2014-08-15 MED ORDER — FENTANYL CITRATE (PF) 100 MCG/2ML IJ SOLN
INTRAMUSCULAR | Status: AC
  Filled 2014-08-15: qty 2

## 2014-08-15 MED ORDER — BUTAMBEN-TETRACAINE-BENZOCAINE 2-2-14 % EX AERO
1.0000 | INHALATION_SPRAY | Freq: Once | CUTANEOUS | Status: AC
  Administered 2014-08-15: 1 via TOPICAL

## 2014-08-15 MED ORDER — LIDOCAINE HCL (PF) 1 % IJ SOLN
INTRAMUSCULAR | Status: AC
  Filled 2014-08-15: qty 5

## 2014-08-15 MED ORDER — ONDANSETRON HCL 4 MG/2ML IJ SOLN
INTRAMUSCULAR | Status: AC
  Filled 2014-08-15: qty 2

## 2014-08-15 MED ORDER — MIDAZOLAM HCL 2 MG/2ML IJ SOLN
1.0000 mg | INTRAMUSCULAR | Status: DC | PRN
Start: 2014-08-15 — End: 2014-08-15
  Administered 2014-08-15: 2 mg via INTRAVENOUS

## 2014-08-15 MED ORDER — PROPOFOL INFUSION 10 MG/ML OPTIME
INTRAVENOUS | Status: DC | PRN
  Administered 2014-08-15: 125 ug/kg/min via INTRAVENOUS

## 2014-08-15 MED ORDER — MIDAZOLAM HCL 2 MG/2ML IJ SOLN
INTRAMUSCULAR | Status: AC
  Filled 2014-08-15: qty 2

## 2014-08-15 MED ORDER — LACTATED RINGERS IV SOLN
INTRAVENOUS | Status: DC
  Administered 2014-08-15: 13:00:00 via INTRAVENOUS

## 2014-08-15 MED ORDER — ONDANSETRON HCL 4 MG/2ML IJ SOLN
4.0000 mg | Freq: Once | INTRAMUSCULAR | Status: AC
  Administered 2014-08-15: 4 mg via INTRAVENOUS

## 2014-08-15 MED ORDER — ONDANSETRON HCL 4 MG/2ML IJ SOLN: 4.0000 mg | Freq: Once | INTRAMUSCULAR | Status: DC | PRN

## 2014-08-15 MED ORDER — FENTANYL CITRATE (PF) 100 MCG/2ML IJ SOLN: 25.0000 ug | INTRAMUSCULAR | Status: DC | PRN

## 2014-08-15 MED ORDER — PROPOFOL 10 MG/ML IV BOLUS
INTRAVENOUS | Status: AC
Start: 2014-08-15 — End: 2014-08-15
  Filled 2014-08-15: qty 20

## 2014-08-15 SURGICAL SUPPLY — 20 items
BLOCK BITE 60FR ADLT L/F BLUE (MISCELLANEOUS) ×3 IMPLANT
ELECT REM PT RETURN 9FT ADLT (ELECTROSURGICAL)
ELECTRODE REM PT RTRN 9FT ADLT (ELECTROSURGICAL) IMPLANT
FLOOR PAD 36X40 (MISCELLANEOUS)
FORCEP COLD BIOPSY (CUTTING FORCEPS) IMPLANT
FORCEPS BIOP RAD 4 LRG CAP 4 (CUTTING FORCEPS) IMPLANT
FORMALIN 10 PREFIL 20ML (MISCELLANEOUS) IMPLANT
KIT CLEAN ENDO COMPLIANCE (KITS) IMPLANT
MANIFOLD NEPTUNE II (INSTRUMENTS) ×3 IMPLANT
NEEDLE SCLEROTHERAPY 25GX240 (NEEDLE) IMPLANT
PAD FLOOR 36X40 (MISCELLANEOUS) IMPLANT
PROBE APC STR FIRE (PROBE) IMPLANT
PROBE INJECTION GOLD (MISCELLANEOUS)
PROBE INJECTION GOLD 7FR (MISCELLANEOUS) IMPLANT
SNARE ROTATE MED OVAL 20MM (MISCELLANEOUS) IMPLANT
SNARE SHORT THROW 13M SML OVAL (MISCELLANEOUS) IMPLANT
SYR 50ML LL SCALE MARK (SYRINGE) IMPLANT
SYR INFLATION 60ML (SYRINGE) IMPLANT
TUBING IRRIGATION ENDOGATOR (MISCELLANEOUS) ×3 IMPLANT
WATER STERILE IRR 1000ML POUR (IV SOLUTION) ×3 IMPLANT

## 2014-08-15 NOTE — Clinical Social Work Note (Signed)
CSW updated [facility] on pt. Aware of possible d/c tomorrow. CSW will continue to follow.  Derenda FennelKara Adanely Reynoso, LCSW [phone]

## 2014-08-15 NOTE — Anesthesia Postprocedure Evaluation (Signed)
  Anesthesia Post-op Note  Patient: Marissa Figueroa  Procedure(s) Performed: Procedure(s): ESOPHAGOGASTRODUODENOSCOPY (EGD) WITH PROPOFOL (N/A)  Patient Location: PACU  Anesthesia Type:MAC  Level of Consciousness: awake, alert  and oriented  Airway and Oxygen Therapy: Patient Spontanous Breathing and Patient connected to face mask oxygen  Post-op Pain: none  Post-op Assessment: Post-op Vital signs reviewed  Post-op Vital Signs: Reviewed and stable  Last Vitals:  Filed Vitals:   08/15/14 1255  BP: 93/41  Pulse:   Temp:   Resp: 17    Complications: No apparent anesthesia complications

## 2014-08-15 NOTE — Anesthesia Preprocedure Evaluation (Signed)
Anesthesia Evaluation  Patient identified by MRN, date of birth, ID band Patient awake  General Assessment Comment:Drowsy but appropriate   Reviewed: Allergy & Precautions, NPO status , Patient's Chart, lab work & pertinent test results  Airway Mallampati: II  TM Distance: >3 FB     Dental  (+) Edentulous Upper, Edentulous Lower   Pulmonary shortness of breath, Current Smoker,  breath sounds clear to auscultation        Cardiovascular +CHF Rhythm:Regular Rate:Normal     Neuro/Psych Hx withdrawals and altered mental status    GI/Hepatic GERD-  ,(+) Cirrhosis -    substance abuse  alcohol use, Hepatitis -  Endo/Other    Renal/GU      Musculoskeletal   Abdominal   Peds  Hematology  (+) anemia ,   Anesthesia Other Findings   Reproductive/Obstetrics                             Anesthesia Physical Anesthesia Plan  ASA: IV  Anesthesia Plan: MAC   Post-op Pain Management:    Induction: Intravenous  Airway Management Planned: Simple Face Mask  Additional Equipment:   Intra-op Plan:   Post-operative Plan:   Informed Consent: I have reviewed the patients History and Physical, chart, labs and discussed the procedure including the risks, benefits and alternatives for the proposed anesthesia with the patient or authorized representative who has indicated his/her understanding and acceptance.     Plan Discussed with:   Anesthesia Plan Comments:         Anesthesia Quick Evaluation

## 2014-08-15 NOTE — Progress Notes (Signed)
Patient denies abdominal pain melena nausea or vomiting. She was noted to have small amount of fresh blood coating commode seat but no frank rectal bleeding reported. She was given another 2 units of FFP yesterday and INR has corrected partially. She is agreeable to proceed with EGD. Her H&H from this morning is 8.5 and 26.7 and platelet count 37K INR is 2.79. As requested by the patient I called her best friend Miss [name] at [phone] just let her know that she will be undergoing EGD this afternoon.

## 2014-08-15 NOTE — Transfer of Care (Signed)
Immediate Anesthesia Transfer of Care Note  Patient: Marissa NicksChristine L Kari  Procedure(s) Performed: Procedure(s): ESOPHAGOGASTRODUODENOSCOPY (EGD) WITH PROPOFOL (N/A)  Patient Location: PACU  Anesthesia Type:MAC  Level of Consciousness: awake  Airway & Oxygen Therapy: Patient Spontanous Breathing and Patient connected to face mask oxygen  Post-op Assessment: Report given to RN  Post vital signs: Reviewed and stable  Last Vitals:  Filed Vitals:   08/15/14 1255  BP: 93/41  Pulse:   Temp:   Resp: 17    Complications: No apparent anesthesia complications

## 2014-08-15 NOTE — Progress Notes (Signed)
Patient having EGD today. Patient answered all questions appropriately to check orientation. Consent signed by patient. Preop notified.

## 2014-08-15 NOTE — Op Note (Signed)
EGD PROCEDURE REPORT  PATIENT:  Marissa Figueroa  MR#:  [id number] Birthdate:  08-Mar-1956, 59 y.o., female Endoscopist:  Dr. Malissa HippoNajeeb U. Rehman, MD  Procedure Date: 08/15/2014  Procedure:   EGD  Indications:  Patient is 59 year old Caucasian female who has a decompensated alcoholic liver disease who was hospitalized over a week ago with upper GI bleed. Procedure was postponed on 2 occasions; first because of coagulopathy and the second time she developed symptoms of alcohol withdrawal. She is not deemed to be stable to undergo EGD. While in the hospital she has not experienced melena. She was noted to have scant amount of fresh blood on the side of a commode by nursing staff in day hospital.            Informed Consent:  The risks, benefits, alternatives & imponderables which include, but are not limited to, bleeding, infection, perforation, drug reaction and potential missed lesion have been reviewed.  The potential for biopsy, lesion removal, esophageal dilation, etc. have also been discussed.  Questions have been answered.  All parties agreeable.  Please see history & physical in medical record for more information.  Medications:  Monitored anesthesia care. Please see anesthesia records for details.  Description of procedure:  The endoscope was introduced through the mouth and advanced to the second portion of the duodenum without difficulty or limitations. The mucosal surfaces were surveyed very carefully during advancement of the scope and upon withdrawal.  Findings:  Esophagus:  Mucosa of the esophagus was normal. GE junction was unremarkable. GEJ:  40 cm Stomach:  Stomach was empty and distended very well with insufflation. Folds in the proximal stomach were normal. Examination of mucosa at gastric body and fundus revealed mows a pattern or snake skinning. Antral mucosa was unremarkable without erosions or ulceration. Pyloric channel was patent. Angularis was unremarkable and no fundal  varices noted. Duodenum:  Normal bulbar and post bulbar mucosa.  Therapeutic/Diagnostic Maneuvers Performed:  None  Complications:  None  Impression: Portal gastropathy. No evidence of esophageal or gastric varices. No evidence of peptic ulcer disease. Suspect she may have bled from Mallory-Weiss tear which has healed by now   Recommendations:  Decrease pantoprazole to 40 mg by mouth every morning. Lab in a.m. If liver disease becomes compensated again will consider colonoscopy at a later date.  REHMAN,NAJEEB U  08/15/2014  1:22 PM  CC: [facility]. [facility] & [facility]. No ref. provider found

## 2014-08-15 NOTE — Progress Notes (Addendum)
[facility] PROGRESS NOTE Interim History: 59 year old with past medical history of cirrhosis due to alcohol abuse comes in for hematemesis to started 4 days prior to admission with some melanotic stools. She refused blood drawn and her EGD on 08/12/2014. After thinking about it she agreed to proceed with EGD on 5.2. 2016   Assessment/Plan: Acute blood loss anemia due to Hematemesis: - She drop from 11.7 back on 07/18/2014 to 9.4 on 08/09/2014. No further melanotic stools or hematemesis. - EGD was originally scheduled for 08/12/2014 and she refused. After discussing with her and she has agreed to proceed with EGD on 5.1.2016. She got 2 units of FFP, INR came down to 2.7, now INR trending up for repeat FFP for EGD, labs not drawn. - I appreciate GIs assistance. - Cont Protonix  IV BID.   Alcoholic cirrhosis of liver with ascites/Bilirubinemia: - He has a MELD score of 28, abdominal ultrasound showed ascites. Cont. Rocephin empirically 4.26.2016. For SBP. - PT and INR are pending.  Acute low back pain: - MRI as below,She has no focal deficits. Cont norco. - PT evaluation pending.  Macrocytic anemia/Thrombocytopenia: - Likely due to ETOH abuse, her platelets are trending up.    UTI: - She has remained afebrile she's currently on Rocephin. - Urine culture grew Escherichia coli sensitive to Rocephin.  Hyponatremia: - Due to secondary hyperaldosteronism, continue Aldactone. Resolved.    ETOH abuse: - No signs of withdrawal, continue to monitor with CIWA. - ETOG level than 5. Continue thiamine and folate.  Code Status: Full code.  DVT Prophylaxis: SCDs Family Communication: None Disposition Plan: home in 24hrs    Consultants:  GI  Procedures: ABD [place] 4.27.2016: Cirrhotic liver with associated small to moderate volume of ascites pain and likely thrombosis of the portal vein. Single mobile gallstone without evidence of cholecystitis. Gallbladder wall thickening is  likely related to ascites. MRI 4.30.2016: L4-5 level. There is an acute appearing broad-based disc herniation. There is stenosis of the lateral recesses  Antibiotics:  None  HPI/Subjective: No melena or hematemesis.  Objective: Filed Vitals:   08/14/14 1542 08/14/14 1635 08/14/14 2101 08/15/14 0600  BP: 115/52 123/57 118/72 122/64  Pulse: 82 90 89 84  Temp: 98 F (36.7 C) 97.9 F (36.6 C) 98.2 F (36.8 C) 98.4 F (36.9 C)  TempSrc: Oral Oral Oral Oral  Resp: Height:      Weight:      SpO2:   92% 93%    Intake/Output Summary (Last 24 hours) at 08/15/14 0914 Last data filed at 08/15/14 0653  Gross per 24 hour  Intake   3185 ml  Output   1200 ml  Net   1985 ml   Filed Weights   08/09/14 1105 08/10/14 0453  Weight: 103.874 kg (229 lb) 106.323 kg (234 lb 6.4 oz)    Exam:  General: Alert, awake, oriented x3, in no acute distress. Wants to be left alone HEENT: No bruits, no goiter.  Heart: Regular rate and rhythm. Lungs: Good air movement, clear no rails Abdomen: Soft, nontender, nondistended, positive bowel sounds.  Neuro: Grossly intact, nonfocal.   Data Reviewed: Basic Metabolic Panel:  Recent Labs Lab 08/09/14 1127 08/10/14 0524 08/11/14 0858 08/12/14 0837 08/13/14 0545  NA 131* 134* 133* 137 136  K 3.1* 3.7 3.6 3.3* 3.1*  CL 94* 97 97 100 100  CO2 30 32 GLUCOSE 220* 216* 125* 118* 115*  BUN 12  14  CREATININE 0.68 0.66 0.66 0.68 0.68  CALCIUM 8.1* 8.2* 8.3* 8.6 8.3*   Liver Function Tests:  Recent Labs Lab 08/09/14 1127 08/10/14 0524 08/11/14 0858  AST 91* 78* 70*  ALT 35 38* 38*  ALKPHOS 133* 147* 161*  BILITOT 10.1* 8.4* 9.0*  PROT 6.2 6.2 6.6  ALBUMIN 1.6* 1.5* 1.7*    Recent Labs Lab 08/09/14 1127  LIPASE 69*    Recent Labs Lab 08/11/14 0858  AMMONIA 26   CBC:  Recent Labs Lab 08/09/14 1127 08/10/14 0524 08/11/14 0519 08/12/14 0839  WBC 6.5 7.2 10.9* 6.5  NEUTROABS 4.4  --   --   --     HGB 9.6* 9.4* 8.9* 9.4*  HCT 29.8* 28.6* 27.5* 29.2*  MCV 104.9* 104.4* 105.0* 107.4*  PLT 48* 48* 48* 53*   Cardiac Enzymes: No results for input(s): CKTOTAL, CKMB, CKMBINDEX, TROPONINI in the last 168 hours. BNP (last 3 results)  Recent Labs  08/09/14 1459  BNP 178.0*    ProBNP (last 3 results) No results for input(s): PROBNP in the last 8760 hours.  CBG:  Recent Labs Lab 08/14/14 0748 08/14/14 1125 08/14/14 1623 08/14/14 2044 08/15/14 0814  GLUCAP 108* 90 100* 142* 112*    Recent Results (from the past 240 hour(s))  Urine culture     Status: None   Collection Time: 08/09/14 11:30 AM  Result Value Ref Range Status   Specimen Description URINE, CATHETERIZED  Final   Special Requests NONE  Final   Colony Count   Final    >=100,000 COLONIES/ML Performed at [facility]    Culture   Final    ESCHERICHIA COLI Performed at [facility]    Report Status 08/12/2014 FINAL  Final   Organism ID, Bacteria ESCHERICHIA COLI  Final      Susceptibility   Escherichia coli - MIC*    AMPICILLIN >=32 RESISTANT Resistant     CEFAZOLIN <=4 SENSITIVE Sensitive     CEFTRIAXONE <=1 SENSITIVE Sensitive     CIPROFLOXACIN <=0.25 SENSITIVE Sensitive     GENTAMICIN <=1 SENSITIVE Sensitive     LEVOFLOXACIN <=0.12 SENSITIVE Sensitive     NITROFURANTOIN <=16 SENSITIVE Sensitive     TOBRAMYCIN <=1 SENSITIVE Sensitive     TRIMETH/SULFA <=20 SENSITIVE Sensitive     PIP/TAZO <=4 SENSITIVE Sensitive     * ESCHERICHIA COLI     Studies: Mr Lumbar Spine Wo Contrast  08/13/2014   CLINICAL DATA:  Larey Seat several days ago. Low back pain. Right hip and leg pain.  EXAM: MRI LUMBAR SPINE WITHOUT CONTRAST  TECHNIQUE: Multiplanar, multisequence MR imaging of the lumbar spine was performed. No intravenous contrast was administered.  COMPARISON:  None.  FINDINGS: The study suffers from motion degradation. There is no abnormality at L1-2 or above. The distal cord and conus are normal with  conus tip at L1.  L2-3: Mild desiccation and bulging of the disc. Mild facet and ligamentous hypertrophy. No compressive stenosis.  L3-4: Moderate circumferential bulging of the disc. Mild facet and ligamentous hypertrophy. Mild stenosis of the canal, lateral recesses and foramina without definite neural compression.  L4-5: Acute appearing broad-based disc herniation. Mild facet and ligamentous hypertrophy. Spinal stenosis with potential for neural compression on either or both sides.  L5-S1: No disc pathology.  Mild facet degeneration.  No stenosis.  IMPRESSION: The significant findings are probably at the L4-5 level. There is an acute appearing broad-based disc herniation. There is stenosis of the lateral recesses that  could cause neural compression on either or both sides.   Electronically Signed   By: Paulina FusiMark  Shogry M.D.   On: 08/13/2014 10:57    Scheduled Meds: . calcium-vitamin D  1 tablet Oral BID  . cefTRIAXone (ROCEPHIN)  IV  1 g Intravenous Q24H  . folic acid  1 mg Oral Daily  . insulin aspart  0-5 Units Subcutaneous QHS  . insulin aspart  0-9 Units Subcutaneous TID WC  . insulin aspart  3 Units Subcutaneous TID WC  . lactulose  10 g Oral BID  . LORazepam  0.5 mg Oral 3 times per day  . multivitamin with minerals  1 tablet Oral Daily  . nicotine  21 mg Transdermal Daily  . pantoprazole  40 mg Oral BID AC  . sodium chloride  3 mL Intravenous Q12H  . spironolactone  100 mg Oral Daily  . thiamine  100 mg Oral Daily   Or  . thiamine  100 mg Intravenous Daily   Continuous Infusions: . sodium chloride 50 mL/hr at 08/15/14 0826    Time Spent: 15 min   Marinda ElkFELIZ ORTIZ, ABRAHAM  [facility] Pager [phone]. If 7PM-7AM, please contact night-coverage at [email or web address], password [email or web address] 08/15/2014, 9:14 AM  LOS: 6 days

## 2014-08-16 ENCOUNTER — Encounter (HOSPITAL_COMMUNITY): Payer: Self-pay | Admitting: Internal Medicine

## 2014-08-16 DIAGNOSIS — D696 Thrombocytopenia, unspecified: Secondary | ICD-10-CM

## 2014-08-16 DIAGNOSIS — D684 Acquired coagulation factor deficiency: Secondary | ICD-10-CM

## 2014-08-16 DIAGNOSIS — N390 Urinary tract infection, site not specified: Secondary | ICD-10-CM

## 2014-08-16 DIAGNOSIS — F1010 Alcohol abuse, uncomplicated: Secondary | ICD-10-CM

## 2014-08-16 DIAGNOSIS — K7031 Alcoholic cirrhosis of liver with ascites: Secondary | ICD-10-CM

## 2014-08-16 DIAGNOSIS — D62 Acute posthemorrhagic anemia: Secondary | ICD-10-CM

## 2014-08-16 LAB — GLUCOSE, CAPILLARY
GLUCOSE-CAPILLARY: 142 mg/dL — AB (ref 70–99)
GLUCOSE-CAPILLARY: 174 mg/dL — AB (ref 70–99)
Glucose-Capillary: 98 mg/dL (ref 70–99)
Glucose-Capillary: 176 mg/dL — ABNORMAL HIGH (ref 70–99)

## 2014-08-16 LAB — CBC
HCT: 25.4 % — ABNORMAL LOW (ref 36.0–46.0)
Hemoglobin: 8.2 g/dL — ABNORMAL LOW (ref 12.0–15.0)
MCH: 35.2 pg — AB (ref 26.0–34.0)
MCHC: 32.3 g/dL (ref 30.0–36.0)
MCV: 109 fL — AB (ref 78.0–100.0)
PLATELETS: 34 10*3/uL — AB (ref 150–400)
RBC: 2.33 MIL/uL — ABNORMAL LOW (ref 3.87–5.11)
RDW: 19.3 % — AB (ref 11.5–15.5)
WBC: 4.2 10*3/uL (ref 4.0–10.5)

## 2014-08-16 LAB — COMPREHENSIVE METABOLIC PANEL
ALBUMIN: 1.5 g/dL — AB (ref 3.5–5.0)
ALT: 28 U/L (ref 14–54)
AST: 50 U/L — AB (ref 15–41)
Alkaline Phosphatase: 118 U/L (ref 38–126)
Anion gap: 4 — ABNORMAL LOW (ref 5–15)
BILIRUBIN TOTAL: 7.7 mg/dL — AB (ref 0.3–1.2)
BUN: 8 mg/dL (ref 6–20)
CALCIUM: 7.8 mg/dL — AB (ref 8.9–10.3)
CO2: 30 mmol/L (ref 22–32)
CREATININE: 0.77 mg/dL (ref 0.44–1.00)
Chloride: 103 mmol/L (ref 101–111)
GFR calc Af Amer: >60 mL/min (ref >60)
GFR calc non Af Amer: >60 mL/min (ref >60)
Glucose, Bld: 115 mg/dL — ABNORMAL HIGH (ref 70–99)
Potassium: 3.7 mmol/L (ref 3.5–5.1)
Sodium: 137 mmol/L (ref 135–145)
Total Protein: 5.2 g/dL — ABNORMAL LOW (ref 6.5–8.1)

## 2014-08-16 LAB — PROTIME-INR
INR: 2.99 — ABNORMAL HIGH (ref 0.00–1.49)
Prothrombin Time: 31.3 seconds — ABNORMAL HIGH (ref 11.6–15.2)

## 2014-08-16 MED ORDER — FUROSEMIDE 20 MG PO TABS
20.0000 mg | ORAL_TABLET | Freq: Every day | ORAL | Status: DC
Start: 2014-08-16 — End: 2014-08-17
  Administered 2014-08-16 – 2014-08-17 (×2): 20 mg via ORAL
  Filled 2014-08-16 (×2): qty 1

## 2014-08-16 MED ORDER — PREDNISONE 20 MG PO TABS
30.0000 mg | ORAL_TABLET | Freq: Every day | ORAL | Status: DC
  Administered 2014-08-16 – 2014-08-18 (×3): 30 mg via ORAL
  Filled 2014-08-16 (×6): qty 1

## 2014-08-16 MED ORDER — PHYTONADIONE 5 MG PO TABS
5.0000 mg | ORAL_TABLET | Freq: Once | ORAL | Status: AC
  Administered 2014-08-16: 5 mg via ORAL
  Filled 2014-08-16 (×2): qty 1

## 2014-08-16 MED ORDER — SODIUM CHLORIDE 0.9 % IV SOLN
Freq: Once | INTRAVENOUS | Status: AC
  Administered 2014-08-16: 12:00:00 via INTRAVENOUS

## 2014-08-16 NOTE — Addendum Note (Signed)
Addendum  created 08/16/14 [id number] by Franco Noneseresa S Treysean Petruzzi, CRNA   Modules edited: Notes Section   Notes Section:  File: [id number]

## 2014-08-16 NOTE — Progress Notes (Signed)
Physical Therapy Treatment Patient Details Name: Marissa Figueroa MRN: [id number] DOB: Feb 17, 1956 Today's Date: 08/16/2014    History of Present Illness This is a 59 year old lady, who has a history of alcohol excess cirrhosis, and continues to drink alcohol, presented with hematemesis, several episodes that occurred 4 days ago. During that day, she estimates that she vomited in total approximately half a cupful. The following day, she had further episodes of hematemesis. In the last couple of days she has had no further episodes of hematemesis. She has had swelling of her abdomen without any pain or fever. She apparently also noticed dark blood in her stool but this has been improving. She apparently had been sober of alcohol for 3 years but began drinking again several months ago. She last drank alcohol 4 days ago when she started to have the hematemesis initially. Also, 2 days ago she had a fever of 102. She has no longer a fever. She is now being admitted for further management.    PT Comments    Pt is much more alert this afternoon but is still extremely weak with poor endurance.  She has improved ability to stand from sitting and gait stability is improved with a walker, able to walk 15'.  She was able to participate in only minimal strengthening exercise while seated due to generalized deconditioning.  She is very appropriate for SNF at d/c.  Follow Up Recommendations  SNF     Equipment Recommendations  Rolling walker with 5" wheels    Recommendations for Other Services       Precautions / Restrictions Precautions Precautions: Fall Restrictions Weight Bearing Restrictions: No    Mobility  Bed Mobility               General bed mobility comments: not tested...pt up in chair  Transfers Overall transfer level: Needs assistance Equipment used: Rolling walker (2 wheeled) Transfers: Sit to/from Stand Sit to Stand: Min assist             Ambulation/Gait Ambulation/Gait assistance: Min assist Ambulation Distance (Feet): 15 Feet Assistive device: Rolling walker (2 wheeled) Gait Pattern/deviations: Shuffle;Decreased stride length   Gait velocity interpretation: Below normal speed for age/gender General Gait Details: gait is more stable today but still very poor endurance   Stairs            Wheelchair Mobility    Modified Rankin (Stroke Patients Only)       Balance     Sitting balance-Leahy Scale: Good     Standing balance support: Bilateral upper extremity supported Standing balance-Leahy Scale: Fair                      Cognition Arousal/Alertness: Awake/alert Behavior During Therapy: WFL for tasks assessed/performed Overall Cognitive Status: Within Functional Limits for tasks assessed                      Exercises General Exercises - Lower Extremity Long Arc Quad: AROM;Both;10 reps;Seated Hip Flexion/Marching: AROM;Both;10 reps;Seated    General Comments        Pertinent Vitals/Pain Pain Assessment: No/denies pain    Home Living                      Prior Function            PT Goals (current goals can now be found in the care plan section) Progress towards PT goals: Progressing toward goals  Frequency  Min 3X/week    PT Plan Current plan remains appropriate    Co-evaluation             End of Session Equipment Utilized During Treatment: Gait belt Activity Tolerance: Patient limited by fatigue Patient left: in chair;with call bell/phone within reach;with chair alarm set     Time: 8295-6213 PT Time Calculation (min) (ACUTE ONLY): 40 min  Charges:  $Gait Training: 8-22 mins $Therapeutic Exercise: 8-22 mins                    G Codes:      Konrad Penta Sep 14, 2014, 3:12 PM

## 2014-08-16 NOTE — Progress Notes (Signed)
  Subjective:  Patient has no complaints. She denies nausea vomiting abdominal pain. She also denies melena or rectal bleeding. She wants to know when she could go home.  Objective: Blood pressure 108/67, pulse 82, temperature 98.2 F (36.8 C), temperature source Oral, resp. rate 18, height 5\' 8"  (1.727 m), weight 234 lb 6.4 oz (106.323 kg), SpO2 97 %. Patient was sound asleep when I walked in and woke up with vocal stimulation. Asterixis absent. Conjunctiva is pale. Sclera is icteric Oropharyngeal mucosa is normal. No neck masses or thyromegaly noted. Cardiac exam with regular rhythm normal S1 and S2. No murmur or gallop noted. Lungs are clear to auscultation. Abdomen is full but soft and nontender without organomegaly or masses. She has 2+ pitting edema involving both legs.  Labs/studies Results:   Recent Labs  08/15/14 0903 08/16/14 0615  WBC 3.7* 4.2  HGB 8.5* 8.2*  HCT 26.7* 25.4*  PLT 37* 34*    BMET   Recent Labs  08/15/14 0903 08/16/14 0615  NA 139 137  K 3.2* 3.7  CL 104 103  CO2 30 30  GLUCOSE 110* 115*  BUN 10 8  CREATININE 0.68 0.77  CALCIUM 8.0* 7.8*    LFT   Recent Labs  08/16/14 0615  PROT 5.2*  ALBUMIN 1.5*  AST 50*  ALT 28  ALKPHOS 118  BILITOT 7.7*    PT/INR   Recent Labs  08/15/14 0903 08/16/14 0615  LABPROT 29.7* 31.3*  INR 2.79* 2.99*     Assessment:  #1. Upper GI bleed inactive. Patient underwent esophagogastroduodenoscopy yesterday revealing portal gastropathy but no other abnormalities. Suspect bleeding from Mallory-Weiss tear which has healed. Patient is on pantoprazole. #2. Anemia multifactorial. H&H keeps drifting downwards. No evidence of overt GI bleed. #3. Decompensated alcoholic liver disease. She has stigmata of both alcoholic hepatitis as well as cirrhosis. Hepatic function is not improving with supportive therapy and abstinence. Hepatic discriminant function is 96.5. No contraindication to treat her with  prednisone for few weeks. #4. Coagulopathy secondary to liver disease. Doubt DIC. #5. Thrombocytopenia secondary cirrhosis and splenomegaly.  Recommendations:  Prednisone 30 mg po every morning. Dose will be tapered by 5 mg every week. Consider rehabilitation without which her prognosis is poor. CBC in a.m.

## 2014-08-16 NOTE — Anesthesia Postprocedure Evaluation (Signed)
  Anesthesia Post-op Note  Patient: Marissa Figueroa  Procedure(s) Performed: Procedure(s): ESOPHAGOGASTRODUODENOSCOPY (EGD) WITH PROPOFOL (N/A)  Patient Location: Nursing Unit  Anesthesia Type:MAC  Patient sleepy.  Airway and Oxygen Therapy: Patient Spontanous Breathing  Post-op Pain: none  Post-op Assessment: Post-op Vital signs reviewed, Patient's Cardiovascular Status Stable, Respiratory Function Stable and Patent Airway  Post-op Vital Signs: Reviewed and stable  Last Vitals:  Filed Vitals:   08/16/14 0536  BP: 108/67  Pulse: 82  Temp: 36.8 C  Resp: 18    Complications: No apparent anesthesia complications

## 2014-08-16 NOTE — Progress Notes (Signed)
[facility] PROGRESS NOTE  Marissa NicksChristine L Figueroa ZOX:[id number] DOB: 12-15-1955 DOA: 08/09/2014 [facility]  Assessment/Plan: 59 y/o female with PMH of CHF, Alcoholic cirrhosis, encephalopathy, GIB, coagulopathy presented with GIB  1. Acute blood loss anemia due to GIB-hematemesis. Patient refused EGD on admission, then agreed. 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 -Hg trend down. no evidence of further acute bleeding. Will cont monitor. TF prn. S/p FFP 2 units.   2. Decompensated alcoholic liver cirrhosis. Coagulopathy, thrombocytopenia, complicated with GIB, portal hypertension. Anasarca, hypoalbuminemia. MELD-26. -started on prednisone per GI. Will repeat vit K. transfuse platelets on 5/3. Monitor closely  -anasarca, fluid overload. Will stop IVF. Restart diuretics, add lasix. adjust as tolerated. F/u labs.  Monitor  3. UTI. afebrile she's currently on Rocephin. - Urine culture grew Escherichia coli sensitive to Rocephin. Will d/c atx today. Patient received 4/27-5/3 4. ETOH abuse.  No signs of withdrawal, continue to monitor with CIWA. Continue thiamine and folate.   Prognosis is guarded due to ongoing advanced liver failure at risk for multiorgan dysfunction    Code Status: full Family Communication: d/w patient (indicate person spoken with, relationship, and if by phone, the number) Disposition Plan: pend clinical improvement    Consultants:  GI  Procedures:  EGD: as above   Antibiotics:  Ceftriaxone 4/27>>5/3  (indicate start date, and stop date if known)  HPI/Subjective: Alert, oriented   Objective: Filed Vitals:   08/16/14 0536  BP: 108/67  Pulse: 82  Temp: 98.2 F (36.8 C)  Resp: 18    Intake/Output Summary (Last 24 hours) at 08/16/14 1031 Last data filed at 08/16/14 0945  Gross per 24 hour  Intake   2190 ml  Output   1050 ml  Net   1140 ml   Filed Weights   08/09/14 1105 08/10/14 0453  Weight: 103.874 kg  (229 lb) 106.323 kg (234 lb 6.4 oz)    Exam:   General:  No distress   Cardiovascular: s1,s2 rrr  Respiratory: few crackles LL  Abdomen: soft, distended. NT  Musculoskeletal: leg edema   Data Reviewed: Basic Metabolic Panel:  Recent Labs Lab 08/11/14 0858 08/12/14 0837 08/13/14 0545 08/15/14 0903 08/16/14 0615  NA 133* 137 136 139 137  K 3.6 3.3* 3.1* 3.2* 3.7  CL 97 100 100 104 103  CO2 31 31 31 30 30   GLUCOSE 125* 118* 115* 110* 115*  BUN 10 12 14 10 8   CREATININE 0.66 0.68 0.68 0.68 0.77  CALCIUM 8.3* 8.6 8.3* 8.0* 7.8*   Liver Function Tests:  Recent Labs Lab 08/09/14 1127 08/10/14 0524 08/11/14 0858 08/16/14 0615  AST 91* 78* 70* 50*  ALT 35 38* 38* 28  ALKPHOS 133* 147* 161* 118  BILITOT 10.1* 8.4* 9.0* 7.7*  PROT 6.2 6.2 6.6 5.2*  ALBUMIN 1.6* 1.5* 1.7* 1.5*    Recent Labs Lab 08/09/14 1127  LIPASE 69*    Recent Labs Lab 08/11/14 0858  AMMONIA 26   CBC:  Recent Labs Lab 08/09/14 1127 08/10/14 0524 08/11/14 0519 08/12/14 0839 08/15/14 0903 08/16/14 0615  WBC 6.5 7.2 10.9* 6.5 3.7* 4.2  NEUTROABS 4.4  --   --   --   --   --   HGB 9.6* 9.4* 8.9* 9.4* 8.5* 8.2*  HCT 29.8* 28.6* 27.5* 29.2* 26.7* 25.4*  MCV 104.9* 104.4* 105.0* 107.4* 109.0* 109.0*  PLT 48* 48* 48* 53* 37* 34*   Cardiac Enzymes: No results for input(s): CKTOTAL, CKMB, CKMBINDEX, TROPONINI in the last  168 hours. BNP (last 3 results)  Recent Labs  08/09/14 1459  BNP 178.0*    ProBNP (last 3 results) No results for input(s): PROBNP in the last 8760 hours.  CBG:  Recent Labs Lab 08/15/14 1143 08/15/14 1443 08/15/14 1703 08/15/14 2121 08/16/14 0747  GLUCAP 88 85 105* 108* 98    Recent Results (from the past 240 hour(s))  Urine culture     Status: None   Collection Time: 08/09/14 11:30 AM  Result Value Ref Range Status   Specimen Description URINE, CATHETERIZED  Final   Special Requests NONE  Final   Colony Count   Final    >=100,000  COLONIES/ML Performed at [facility]    Culture   Final    ESCHERICHIA COLI Performed at [facility]    Report Status 08/12/2014 FINAL  Final   Organism ID, Bacteria ESCHERICHIA COLI  Final      Susceptibility   Escherichia coli - MIC*    AMPICILLIN >=32 RESISTANT Resistant     CEFAZOLIN <=4 SENSITIVE Sensitive     CEFTRIAXONE <=1 SENSITIVE Sensitive     CIPROFLOXACIN <=0.25 SENSITIVE Sensitive     GENTAMICIN <=1 SENSITIVE Sensitive     LEVOFLOXACIN <=0.12 SENSITIVE Sensitive     NITROFURANTOIN <=16 SENSITIVE Sensitive     TOBRAMYCIN <=1 SENSITIVE Sensitive     TRIMETH/SULFA <=20 SENSITIVE Sensitive     PIP/TAZO <=4 SENSITIVE Sensitive     * ESCHERICHIA COLI     Studies: No results found.  Scheduled Meds: . calcium-vitamin D  1 tablet Oral BID  . cefTRIAXone (ROCEPHIN)  IV  1 g Intravenous Q24H  . folic acid  1 mg Oral Daily  . insulin aspart  0-5 Units Subcutaneous QHS  . insulin aspart  0-9 Units Subcutaneous TID WC  . insulin aspart  3 Units Subcutaneous TID WC  . lactulose  10 g Oral BID  . LORazepam  0.5 mg Oral 3 times per day  . multivitamin with minerals  1 tablet Oral Daily  . nicotine  21 mg Transdermal Daily  . pantoprazole  40 mg Oral BID AC  . predniSONE  30 mg Oral Q breakfast  . sodium chloride  3 mL Intravenous Q12H  . spironolactone  100 mg Oral Daily  . thiamine  100 mg Oral Daily   Or  . thiamine  100 mg Intravenous Daily   Continuous Infusions: . sodium chloride 50 mL/hr at 08/15/14 1610    Active Problems:   ETOH abuse   Alcoholic cirrhosis of liver with ascites   Hematemesis   Acute blood loss anemia   Hyponatremia   Bilirubinemia   Thrombocytopenia   Macrocytic anemia   UTI (urinary tract infection)    Time spent: >35 minutes     Marissa Figueroa  [facility] Pager [phone]. If 7PM-7AM, please contact night-coverage at [email or web address], password [email or web address] 08/16/2014, 10:31 AM  LOS: 7 days

## 2014-08-17 DIAGNOSIS — K7031 Alcoholic cirrhosis of liver with ascites: Secondary | ICD-10-CM

## 2014-08-17 DIAGNOSIS — R188 Other ascites: Secondary | ICD-10-CM

## 2014-08-17 DIAGNOSIS — K3189 Other diseases of stomach and duodenum: Secondary | ICD-10-CM

## 2014-08-17 DIAGNOSIS — K920 Hematemesis: Secondary | ICD-10-CM

## 2014-08-17 DIAGNOSIS — D62 Acute posthemorrhagic anemia: Secondary | ICD-10-CM

## 2014-08-17 LAB — CBC
HCT: 29.2 % — ABNORMAL LOW (ref 36.0–46.0)
Hemoglobin: 9.3 g/dL — ABNORMAL LOW (ref 12.0–15.0)
MCH: 35 pg — AB (ref 26.0–34.0)
MCHC: 31.8 g/dL (ref 30.0–36.0)
MCV: 109.8 fL — ABNORMAL HIGH (ref 78.0–100.0)
PLATELETS: 49 10*3/uL — AB (ref 150–400)
RBC: 2.66 MIL/uL — ABNORMAL LOW (ref 3.87–5.11)
RDW: 19.3 % — ABNORMAL HIGH (ref 11.5–15.5)
WBC: 7.6 10*3/uL (ref 4.0–10.5)

## 2014-08-17 LAB — GLUCOSE, CAPILLARY
Glucose-Capillary: 123 mg/dL — ABNORMAL HIGH (ref 70–99)
Glucose-Capillary: 144 mg/dL — ABNORMAL HIGH (ref 70–99)
Glucose-Capillary: 146 mg/dL — ABNORMAL HIGH (ref 70–99)
Glucose-Capillary: 167 mg/dL — ABNORMAL HIGH (ref 70–99)

## 2014-08-17 LAB — PREPARE PLATELET PHERESIS: Unit division: 0

## 2014-08-17 LAB — PROTIME-INR
INR: 2.77 — ABNORMAL HIGH (ref 0.00–1.49)
Prothrombin Time: 29.5 seconds — ABNORMAL HIGH (ref 11.6–15.2)

## 2014-08-17 LAB — HEMOGLOBIN A1C
Hgb A1c MFr Bld: 4.4 % — ABNORMAL LOW (ref 4.8–5.6)
MEAN PLASMA GLUCOSE: 80 mg/dL

## 2014-08-17 MED ORDER — FUROSEMIDE 40 MG PO TABS
40.0000 mg | ORAL_TABLET | Freq: Every day | ORAL | Status: DC
  Administered 2014-08-18: 40 mg via ORAL
  Filled 2014-08-17: qty 1

## 2014-08-17 MED ORDER — ALBUMIN HUMAN 25 % IV SOLN
50.0000 g | Freq: Every day | INTRAVENOUS | Status: DC
  Administered 2014-08-17: 50 g via INTRAVENOUS
  Filled 2014-08-17 (×2): qty 200

## 2014-08-17 NOTE — Progress Notes (Signed)
  Subjective:  Patient complains of edema to her hands. She denies nausea vomiting abdominal pain melena or rectal bleeding. She has poor appetite.  Objective: Blood pressure 117/56, pulse 92, temperature 97.9 F (36.6 C), temperature source Oral, resp. rate 18, height 5\' 8"  (1.727 m), weight 234 lb 6.4 oz (106.323 kg), SpO2 91 %. Patient is alert and does not have asterixis. She remains jaundiced. Abdomen is distended but not tense or tender. She has 2+ pitting edema involving both legs.  Labs/studies Results:   Recent Labs  08/15/14 0903 08/16/14 0615 08/17/14 0620  WBC 3.7* 4.2 7.6  HGB 8.5* 8.2* 9.3*  HCT 26.7* 25.4* 29.2*  PLT 37* 34* 49*    BMET   Recent Labs  08/15/14 0903 08/16/14 0615  NA 139 137  K 3.2* 3.7  CL 104 103  CO2 30 30  GLUCOSE 110* 115*  BUN 10 8  CREATININE 0.68 0.77  CALCIUM 8.0* 7.8*    LFT   Recent Labs  08/16/14 0615  PROT 5.2*  ALBUMIN 1.5*  AST 50*  ALT 28  ALKPHOS 118  BILITOT 7.7*    PT/INR   Recent Labs  08/16/14 0615 08/17/14 0620  LABPROT 31.3* 29.5*  INR 2.99* 2.77*     Assessment:  #1. Upper GI bleed inactive felt to be secondary to Mallory-Weiss tear. EGD 2 days ago was unremarkable except portal gastropathy. EGD performed of week after bleeding episode and therefore could've easily heals. #2. Alcoholic hepatitis on top of alcoholic cirrhosis. Patient remains with cholestasis and coagulopathy. Day 2 on prednisone. #3. Fluid overload. She has ascites and lower extremity edema. Patient is on low-dose furosemide and spironolactone. Serum albumin is very low. #4. Thrombocytopenia secondary to cirrhosis and splenomegaly.   Recommendations;  Increase furosemide to 40 mg by mouth daily. Albumin 50 g IV today and tomorrow..[name] Kitchen

## 2014-08-17 NOTE — Progress Notes (Signed)
PT Cancellation Note  Patient Details Name: Marissa Figueroa MRN: [id number] DOB: 11/08/1955   Cancelled Treatment:    Reason Eval/Treat Not Completed: Fatigue/lethargy limiting ability to participate   [name], [name] L 08/17/2014, 12:48 PM

## 2014-08-17 NOTE — Progress Notes (Signed)
Marissa Figueroa  Marissa Figueroa VHQ:[id number] DOB: 09-22-1955 DOA: 08/09/2014 PCP: Marissa Figueroa  Assessment/Plan: Acute blood loss anemia -With hematemesis and GI bleed. 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 -Hemoglobin has remained stable to slightly increased at around the 8.5-9 range. -No indication for transfusion at present.  Decompensated alcoholic cirrhosis -With coagulopathy, thrombocytopenia, portal hypertension, anasarca, hypoalbuminemia. -Started on prednisone per GI, was transfused platelets on 5/3. -Albumin and Lasix have been ordered by GI to try diuresis her.  UTI -Urine culture grew Escherichia coli sensitive to Rocephin, completed treatment from 4/27 until 5/3.  Alcohol abuse  -no signs of withdrawal. -Continue thiamine/folate.  Code Status: Full code Family Communication: Patient only  Disposition Plan: To SNF when ready, likely in a.m.   Consultants:  GI, Dr. Karilyn Figueroa   Antibiotics:  None   Subjective: Complains of feeling weak and she does not like hospital food  Objective: Filed Vitals:   08/17/14 0033 08/17/14 0558 08/17/14 1457 08/17/14 1526  BP: 111/67 117/56  103/51  Pulse: 96 92  85  Temp: 98.1 F (36.7 C) 97.9 F (36.6 C)  98.6 F (37 C)  TempSrc: Oral Oral  Oral  Resp: 18 18  18   Height:      Weight:   108.591 kg (239 lb 6.4 oz)   SpO2: 90% 91%  100%    Intake/Output Summary (Last 24 hours) at 08/17/14 1756 Last data filed at 08/17/14 1458  Gross per 24 hour  Intake    240 ml  Output    825 ml  Net   -585 ml   Filed Weights   08/09/14 1105 08/10/14 0453 08/17/14 1457  Weight: 103.874 kg (229 lb) 106.323 kg (234 lb 6.4 oz) 108.591 kg (239 lb 6.4 oz)    Exam:   General:  Alert, awake, oriented 3  Cardiovascular: Regular rate and rhythm  Respiratory: Clear to auscultation bilaterally  Abdomen: Distended,  positive fluid wave, positive bowel sounds, no rigidity or guarding  Extremities: 1-2+ pitting edema bilaterally   Neurologic:  Nonfocal  Data Reviewed: Basic Metabolic Panel:  Recent Labs Lab 08/11/14 0858 08/12/14 0837 08/13/14 0545 08/15/14 0903 08/16/14 0615  NA 133* 137 136 139 137  K 3.6 3.3* 3.1* 3.2* 3.7  CL 97 100 100 104 103  CO2 31 31 31 30 30   GLUCOSE 125* 118* 115* 110* 115*  BUN 10 12 14 10 8   CREATININE 0.66 0.68 0.68 0.68 0.77  CALCIUM 8.3* 8.6 8.3* 8.0* 7.8*   Liver Function Tests:  Recent Labs Lab 08/11/14 0858 08/16/14 0615  AST 70* 50*  ALT 38* 28  ALKPHOS 161* 118  BILITOT 9.0* 7.7*  PROT 6.6 5.2*  ALBUMIN 1.7* 1.5*   No results for input(s): LIPASE, AMYLASE in the last 168 hours.  Recent Labs Lab 08/11/14 0858  AMMONIA 26   CBC:  Recent Labs Lab 08/11/14 0519 08/12/14 0839 08/15/14 0903 08/16/14 0615 08/17/14 0620  WBC 10.9* 6.5 3.7* 4.2 7.6  HGB 8.9* 9.4* 8.5* 8.2* 9.3*  HCT 27.5* 29.2* 26.7* 25.4* 29.2*  MCV 105.0* 107.4* 109.0* 109.0* 109.8*  PLT 48* 53* 37* 34* 49*   Cardiac Enzymes: No results for input(s): CKTOTAL, CKMB, CKMBINDEX, TROPONINI in the last 168 hours. BNP (last 3 results)  Recent Labs  08/09/14 1459  BNP 178.0*    ProBNP (last 3 results) No results for input(s): PROBNP in the last 8760 hours.  CBG:  Recent Labs Lab 08/16/14 1638 08/16/14 2223 08/17/14 0748 08/17/14 1113 08/17/14 1631  GLUCAP 174* 176* 123* 144* 146*    Recent Results (from the past 240 hour(s))  Urine culture     Status: None   Collection Time: 08/09/14 11:30 AM  Result Value Ref Range Status   Specimen Description URINE, CATHETERIZED  Final   Special Requests NONE  Final   Colony Count   Final    >=100,000 COLONIES/ML Performed at [facility]    Culture   Final    ESCHERICHIA COLI Performed at [facility]    Report Status 08/12/2014 FINAL  Final   Organism ID, Bacteria ESCHERICHIA COLI  Final       Susceptibility   Escherichia coli - MIC*    AMPICILLIN >=32 RESISTANT Resistant     CEFAZOLIN <=4 SENSITIVE Sensitive     CEFTRIAXONE <=1 SENSITIVE Sensitive     CIPROFLOXACIN <=0.25 SENSITIVE Sensitive     GENTAMICIN <=1 SENSITIVE Sensitive     LEVOFLOXACIN <=0.12 SENSITIVE Sensitive     NITROFURANTOIN <=16 SENSITIVE Sensitive     TOBRAMYCIN <=1 SENSITIVE Sensitive     TRIMETH/SULFA <=20 SENSITIVE Sensitive     PIP/TAZO <=4 SENSITIVE Sensitive     * ESCHERICHIA COLI     Studies: No results found.  Scheduled Meds: . albumin human  50 g Intravenous Q1400  . calcium-vitamin D  1 tablet Oral BID  . folic acid  1 mg Oral Daily  . [START ON 08/18/2014] furosemide  40 mg Oral Daily  . insulin aspart  0-5 Units Subcutaneous QHS  . insulin aspart  0-9 Units Subcutaneous TID WC  . lactulose  10 g Oral BID  . LORazepam  0.5 mg Oral 3 times per day  . multivitamin with minerals  1 tablet Oral Daily  . nicotine  21 mg Transdermal Daily  . pantoprazole  40 mg Oral BID AC  . predniSONE  30 mg Oral Q breakfast  . sodium chloride  3 mL Intravenous Q12H  . spironolactone  100 mg Oral Daily  . thiamine  100 mg Oral Daily   Or  . thiamine  100 mg Intravenous Daily   Continuous Infusions:   Active Problems:   ETOH abuse   Alcoholic cirrhosis of liver with ascites   Hematemesis   Acute blood loss anemia   Hyponatremia   Bilirubinemia   Thrombocytopenia   Macrocytic anemia   UTI (urinary tract infection)    Time spent: 35 minutes. Greater than 50% of this time was spent in direct contact with the patient coordinating care.    Marissa Figueroa  [facility] Pager [phone]  If 7PM-7AM, please contact night-coverage at [email or web address], password [email or web address] 08/17/2014, 5:56 PM  LOS: 8 days

## 2014-08-18 ENCOUNTER — Inpatient Hospital Stay
Admission: RE | Admit: 2014-08-18 | Discharge: 2014-09-21 | Disposition: A | Discharge status: Nursing Home (Includes ICF) | Payer: Medicaid Other | Source: Ambulatory Visit | Attending: Internal Medicine | Admitting: Internal Medicine

## 2014-08-18 ENCOUNTER — Other Ambulatory Visit: Payer: Self-pay | Admitting: Internal Medicine

## 2014-08-18 ENCOUNTER — Ambulatory Visit (HOSPITAL_COMMUNITY)
Admission: RE | Admit: 2014-08-18 | Discharge: 2014-08-18 | Disposition: A | Discharge status: Home / Self Care | Payer: Medicare Other | Source: Ambulatory Visit | Attending: Internal Medicine | Admitting: Internal Medicine

## 2014-08-18 DIAGNOSIS — R188 Other ascites: Secondary | ICD-10-CM

## 2014-08-18 DIAGNOSIS — S59901A Unspecified injury of right elbow, initial encounter: Secondary | ICD-10-CM | POA: Diagnosis not present

## 2014-08-18 DIAGNOSIS — S6991XA Unspecified injury of right wrist, hand and finger(s), initial encounter: Secondary | ICD-10-CM | POA: Insufficient documentation

## 2014-08-18 DIAGNOSIS — Z09 Encounter for follow-up examination after completed treatment for conditions other than malignant neoplasm: Principal | ICD-10-CM

## 2014-08-18 DIAGNOSIS — K3189 Other diseases of stomach and duodenum: Secondary | ICD-10-CM

## 2014-08-18 DIAGNOSIS — W19XXXA Unspecified fall, initial encounter: Secondary | ICD-10-CM | POA: Insufficient documentation

## 2014-08-18 LAB — GLUCOSE, CAPILLARY
Glucose-Capillary: 134 mg/dL — ABNORMAL HIGH (ref 70–99)
Glucose-Capillary: 130 mg/dL — ABNORMAL HIGH (ref 70–99)

## 2014-08-18 LAB — COMPREHENSIVE METABOLIC PANEL
ALBUMIN: 2.5 g/dL — AB (ref 3.5–5.0)
ALT: 31 U/L (ref 14–54)
ANION GAP: 6 (ref 5–15)
AST: 51 U/L — AB (ref 15–41)
Alkaline Phosphatase: 140 U/L — ABNORMAL HIGH (ref 38–126)
BUN: 8 mg/dL (ref 6–20)
CHLORIDE: 100 mmol/L — AB (ref 101–111)
CO2: 31 mmol/L (ref 22–32)
Calcium: 9.1 mg/dL (ref 8.9–10.3)
Creatinine, Ser: 0.73 mg/dL (ref 0.44–1.00)
GFR calc Af Amer: >60 mL/min (ref >60)
GFR calc non Af Amer: >60 mL/min (ref >60)
Glucose, Bld: 136 mg/dL — ABNORMAL HIGH (ref 70–99)
POTASSIUM: 3.9 mmol/L (ref 3.5–5.1)
Sodium: 137 mmol/L (ref 135–145)
Total Bilirubin: 9.3 mg/dL — ABNORMAL HIGH (ref 0.3–1.2)
Total Protein: 6.5 g/dL (ref 6.5–8.1)

## 2014-08-18 LAB — CBC
HCT: 26.7 % — ABNORMAL LOW (ref 36.0–46.0)
Hemoglobin: 8.5 g/dL — ABNORMAL LOW (ref 12.0–15.0)
MCH: 34.8 pg — AB (ref 26.0–34.0)
MCHC: 31.8 g/dL (ref 30.0–36.0)
MCV: 109.4 fL — ABNORMAL HIGH (ref 78.0–100.0)
PLATELETS: 45 10*3/uL — AB (ref 150–400)
RBC: 2.44 MIL/uL — AB (ref 3.87–5.11)
RDW: 19.7 % — ABNORMAL HIGH (ref 11.5–15.5)
WBC: 7.5 10*3/uL (ref 4.0–10.5)

## 2014-08-18 MED ORDER — FUROSEMIDE 40 MG PO TABS: 40.0000 mg | ORAL_TABLET | Freq: Every day | ORAL | Status: DC

## 2014-08-18 MED ORDER — ADULT MULTIVITAMIN W/MINERALS CH: 1.0000 | ORAL_TABLET | Freq: Every day | ORAL | Status: AC

## 2014-08-18 MED ORDER — LORAZEPAM 0.5 MG PO TABS
0.5000 mg | ORAL_TABLET | Freq: Three times a day (TID) | ORAL | Status: DC
Start: 2014-08-18 — End: 2014-09-19

## 2014-08-18 MED ORDER — FOLIC ACID 1 MG PO TABS: 1.0000 mg | ORAL_TABLET | Freq: Every day | ORAL | Status: DC

## 2014-08-18 MED ORDER — THIAMINE HCL 100 MG PO TABS: 100.0000 mg | ORAL_TABLET | Freq: Every day | ORAL | Status: DC

## 2014-08-18 MED ORDER — PANTOPRAZOLE SODIUM 40 MG PO TBEC: 40.0000 mg | DELAYED_RELEASE_TABLET | Freq: Two times a day (BID) | ORAL | Status: DC

## 2014-08-18 MED ORDER — PREDNISONE 10 MG PO TABS: 30.0000 mg | ORAL_TABLET | Freq: Every day | ORAL | Status: DC

## 2014-08-18 NOTE — Progress Notes (Signed)
Physical Therapy Note  Patient Details  Name: Marissa Figueroa MRN: [id number] Date of Birth: 1955/10/25 Today's Date: 08/18/2014    Pt refused therapy today stating she was nauseated.   Bascom LevelsFrazier, Amy B 08/18/2014, 12:09 PM

## 2014-08-18 NOTE — Progress Notes (Signed)
Patient ID: Lacretia NicksChristine L Figueroa, female   DOB: 1955/06/04, 59 y.o.   MRN: [id number] States her appetite is not good. Does not like served. Did not ambulate with PT yesterday. Blood pressure 111/67, pulse 82, temperature 98 F (36.7 C), temperature source Oral, resp. rate 18, height 5\' 8"  (1.727 m), weight 237 lb 3.4 oz (107.6 kg), SpO2 100 %. Edema to arms and legs. Remains alert and oriented. Bilirubin is climbing. CBC    Component Value Date/Time   WBC 7.5 08/18/2014 0735   RBC 2.44* 08/18/2014 0735   HGB 8.5* 08/18/2014 0735   HCT 26.7* 08/18/2014 0735   PLT 45* 08/18/2014 0735   MCV 109.4* 08/18/2014 0735   MCH 34.8* 08/18/2014 0735   MCHC 31.8 08/18/2014 0735   RDW 19.7* 08/18/2014 0735   LYMPHSABS 1.4 08/09/2014 1127   MONOABS 0.5 08/09/2014 1127   EOSABS 0.2 08/09/2014 1127   BASOSABS 0.0 08/09/2014 1127    Hepatic Function Panel     Component Value Date/Time   PROT 6.5 08/18/2014 0735   ALBUMIN 2.5* 08/18/2014 0735   AST 51* 08/18/2014 0735   ALT 31 08/18/2014 0735   ALKPHOS 140* 08/18/2014 0735   BILITOT 9.3* 08/18/2014 0735   BILIDIR 3.3* 08/11/2014 0858   IBILI 1.7* 11/18/2012 1030  Assessment: Alcoholic cirrhosis with ascites. Bilirubin climbing. Hemoglobin has dropped. Patient condition is guarded.

## 2014-08-18 NOTE — Progress Notes (Signed)
[facility] today per Dr. Ardyth HarpsHernandez. Pt's IV site D/c'd and WDL. Pt's VSS.  Report called [facility] [place] Verbalized understanding. Pt left floor via WC in stable condition accompanied by NT.

## 2014-08-18 NOTE — Clinical Social Work Note (Signed)
[facility] sleeping at time of visit. Daughter, Marcelino DusterMichelle and facility aware and agreeable. Pt will transfer with staff.  Derenda FennelKara Ytzel [place], LCSW  [phone]

## 2014-08-18 NOTE — Discharge Summary (Signed)
Physician Discharge Summary  Marissa Figueroa EAV:[id number] DOB: Aug 01, 1955 DOA: 08/09/2014  PCP: [facility]  Admit date: 08/09/2014 Discharge date: 08/18/2014  Time spent: 45 minutes  Recommendations for Outpatient Follow-up:  -Will be discharged to skilled nursing facility. -Follow-up with Dr. Karilyn Cota in 2 weeks.   Discharge Diagnoses:  Active Problems:   ETOH abuse   Alcoholic cirrhosis of liver with ascites   Hematemesis   Acute blood loss anemia   Hyponatremia   Bilirubinemia   Thrombocytopenia   Macrocytic anemia   UTI (urinary tract infection)   Discharge Condition: Stable  Filed Weights   08/10/14 0453 08/17/14 1457 08/18/14 0737  Weight: 106.323 kg (234 lb 6.4 oz) 108.591 kg (239 lb 6.4 oz) 107.6 kg (237 lb 3.4 oz)    History of present illness:  This is a 59 year old lady, who has a history of alcohol excess cirrhosis, and continues to drink alcohol, presented with hematemesis, several episodes that occurred 4 days ago. During that day, she estimates that she vomited in total approximately half a cupful. The following day, she had further episodes of hematemesis. In the last couple of days she has had no further episodes of hematemesis. She has had swelling of her abdomen without any pain or fever. She apparently also noticed dark blood in her stool but this has been improving. She apparently had been sober of alcohol for 3 years but began drinking again several months ago. She last drank alcohol 4 days ago when she started to have the hematemesis initially. Also, 2 days ago she had a fever of 102. She has no longer a fever. She is now being admitted for further management.  Hospital Course:   GI bleed/acute blood loss anemia -Underwent EGD on 5/2 findings consistent with portal gastropathy and a suspected Mallory-Weiss tear. -Hemoglobin has remained stable to slightly increased at around 8.5-9 range. Has had no indication for  transfusion.  Decompensated alcoholic cirrhosis with acute alcoholic hepatitis -Has been started on prednisone. She is currently at 30 mg a day. Plan will be to decrease by 5 mg every Monday until taper is complete. -Has received albumin and Lasix to try and diurese her. -Received platelets on 5/3.  UTI -Culture grew Escherichia coli sensitive to Rocephin completed treatment on 5/3.  Alcohol abuse -No signs of withdrawal while in the hospital, continue thiamine and folate.   Procedures:  EGD as above   Consultations:  GI  Discharge Instructions  Discharge Instructions    Increase activity slowly    Complete by:  As directed             Medication List    STOP taking these medications        alprazolam 2 MG tablet  Commonly known as:  XANAX     Milk Thistle 150 MG Caps      TAKE these medications        B-COMPLEX PO  Take 1 tablet by mouth daily.     CALCIUM 600+D 600-400 MG-UNIT per tablet  Generic drug:  Calcium Carbonate-Vitamin D  Take 1 tablet by mouth 2 (two) times daily.     folic acid 1 MG tablet  Commonly known as:  FOLVITE  Take 1 tablet (1 mg total) by mouth daily.     furosemide 40 MG tablet  Commonly known as:  LASIX  Take 1 tablet (40 mg total) by mouth daily.     lactulose 10 GM/15ML solution  Commonly known as:  CHRONULAC  Take 15 mLs (10 g total) by mouth 2 (two) times daily.     LORazepam 0.5 MG tablet  Commonly known as:  ATIVAN  Take 1 tablet (0.5 mg total) by mouth every 8 (eight) hours.     multivitamin with minerals Tabs tablet  Take 1 tablet by mouth daily.     pantoprazole 40 MG tablet  Commonly known as:  PROTONIX  Take 1 tablet (40 mg total) by mouth 2 (two) times daily before a meal.     predniSONE 10 MG tablet  Commonly known as:  DELTASONE  Take 3 tablets (30 mg total) by mouth daily with breakfast.     spironolactone 25 MG tablet  Commonly known as:  ALDACTONE  Take 50 mg by mouth daily.     thiamine 100 MG  tablet  Take 1 tablet (100 mg total) by mouth daily.       Allergies  Allergen Reactions  . Ultram [Tramadol] Other (See Comments)    Headache       Follow-up Information    [facility]   Why:  Will need eligability appointment   Contact information:   [address] [place] [address] [phone]        The results of significant diagnostics from this hospitalization (including imaging, microbiology, ancillary and laboratory) are listed below for reference.    Significant Diagnostic Studies: Dg Chest 2 View  08/09/2014   CLINICAL DATA:  Shortness of breath and weakness  EXAM: CHEST  2 VIEW  COMPARISON:  June 27, 2013  FINDINGS: There is no edema or consolidation. The heart size and pulmonary vascularity are normal. No adenopathy. There is degenerative change in the thoracic spine.  IMPRESSION: No edema or consolidation.   Electronically Signed   By: Bretta BangWilliam  Woodruff III M.D.   On: 08/09/2014 15:48   Mr Lumbar Spine Wo Contrast  08/13/2014   CLINICAL DATA:  Larey SeatFell several days ago. Low back pain. Right hip and leg pain.  EXAM: MRI LUMBAR SPINE WITHOUT CONTRAST  TECHNIQUE: Multiplanar, multisequence MR imaging of the lumbar spine was performed. No intravenous contrast was administered.  COMPARISON:  None.  FINDINGS: The study suffers from motion degradation. There is no abnormality at L1-2 or above. The distal cord and conus are normal with conus tip at L1.  L2-3: Mild desiccation and bulging of the disc. Mild facet and ligamentous hypertrophy. No compressive stenosis.  L3-4: Moderate circumferential bulging of the disc. Mild facet and ligamentous hypertrophy. Mild stenosis of the canal, lateral recesses and foramina without definite neural compression.  L4-5: Acute appearing broad-based disc herniation. Mild facet and ligamentous hypertrophy. Spinal stenosis with potential for neural compression on either or both sides.  L5-S1: No disc pathology.  Mild  facet degeneration.  No stenosis.  IMPRESSION: The significant findings are probably at the L4-5 level. There is an acute appearing broad-based disc herniation. There is stenosis of the lateral recesses that could cause neural compression on either or both sides.   Electronically Signed   By: Paulina FusiMark  Shogry M.D.   On: 08/13/2014 10:57   [place] Abdomen Complete  08/09/2014   CLINICAL DATA:  Jaundice for 1 month.  Vomiting.  EXAM: ULTRASOUND ABDOMEN COMPLETE  COMPARISON:  CT abdomen with and without contrast 06/26/2012.  FINDINGS: Gallbladder: A single mobile gallstone measuring 1.5 cm is identified. The gallbladder wall appears thickened Ms. a small amount pericholecystic fluid. Sonographer reports negative Murphy's sign.  Common  bile duct: Diameter: 0.7 cm  Liver: Heterogeneous echotexture and a nodular border consistent with cirrhosis are identified. A small to moderate volume of abdominal ascites is seen. Blood flow within a peripheral branch of the portal vein is reverse and the main portal vein appears thrombosed.  IVC: No abnormality visualized.  Pancreas: Visualized portion unremarkable.  Spleen: Mildly enlarged at 529 cm cubed. 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  Right Kidney: Length: 11.9 cm. Echogenicity within normal limits. No mass or hydronephrosis visualized.  Left Kidney: Length: 13.2 cm. Echogenicity within normal limits. No mass or hydronephrosis visualized.  Abdominal aorta: No aneurysm visualized.  Other findings: None.  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  Single mobile gallstone without evidence of cholecystitis. Gallbladder wall thickening is likely related to ascites.   Electronically Signed   By: Drusilla Kannerhomas  Dalessio M.D.   On: 08/09/2014 15:04   [place] Abdomen Limited  08/10/2014   CLINICAL DATA:  Ascites  EXAM: LIMITED ABDOMEN ULTRASOUND FOR ASCITES  TECHNIQUE: Limited ultrasound survey for ascites was performed  in all four abdominal quadrants.  COMPARISON:  None.  FINDINGS: Limited ultrasound was performed of all four abdominal quadrants.  Small volume ascites was present, with the largest pocket in the right upper quadrant.  This was not considered sufficient for paracentesis.  IMPRESSION: Small volume abdominal ascites, not sufficient for paracentesis.   Electronically Signed   By: Charline BillsSriyesh  Krishnan M.D.   On: 08/10/2014 12:59    Microbiology: Recent Results (from the past 240 hour(s))  Urine culture     Status: None   Collection Time: 08/09/14 11:30 AM  Result Value Ref Range Status   Specimen Description URINE, CATHETERIZED  Final   Special Requests NONE  Final   Colony Count   Final    >=100,000 COLONIES/ML Performed at [facility]    Culture   Final    ESCHERICHIA COLI Performed at [facility]    Report Status 08/12/2014 FINAL  Final   Organism ID, Bacteria ESCHERICHIA COLI  Final      Susceptibility   Escherichia coli - MIC*    AMPICILLIN >=32 RESISTANT Resistant     CEFAZOLIN <=4 SENSITIVE Sensitive     CEFTRIAXONE <=1 SENSITIVE Sensitive     CIPROFLOXACIN <=0.25 SENSITIVE Sensitive     GENTAMICIN <=1 SENSITIVE Sensitive     LEVOFLOXACIN <=0.12 SENSITIVE Sensitive     NITROFURANTOIN <=16 SENSITIVE Sensitive     TOBRAMYCIN <=1 SENSITIVE Sensitive     TRIMETH/SULFA <=20 SENSITIVE Sensitive     PIP/TAZO <=4 SENSITIVE Sensitive     * ESCHERICHIA COLI     Labs: Basic Metabolic Panel:  Recent Labs Lab 08/12/14 0837 08/13/14 0545 08/15/14 0903 08/16/14 0615 08/18/14 0735  NA 137 136 139 137 137  K 3.3* 3.1* 3.2* 3.7 3.9  CL 100 100 104 103 100*  CO2 31 31 30 30 31   GLUCOSE 118* 115* 110* 115* 136*  BUN 12 14 10 8 8   CREATININE 0.68 0.68 0.68 0.77 0.73  CALCIUM 8.6 8.3* 8.0* 7.8* 9.1   Liver Function Tests:  Recent Labs Lab 08/16/14 0615 08/18/14 0735  AST 50* 51*  ALT 28 31  ALKPHOS 118 140*  BILITOT 7.7* 9.3*  PROT 5.2* 6.5  ALBUMIN 1.5*  2.5*   No results for input(s): LIPASE, AMYLASE in the last 168 hours. No results for input(s): AMMONIA in the last 168 hours. CBC:  Recent  Labs Lab 08/12/14 0839 08/15/14 0903 08/16/14 0615 08/17/14 0620 08/18/14 0735  WBC 6.5 3.7* 4.2 7.6 7.5  HGB 9.4* 8.5* 8.2* 9.3* 8.5*  HCT 29.2* 26.7* 25.4* 29.2* 26.7*  MCV 107.4* 109.0* 109.0* 109.8* 109.4*  PLT 53* 37* 34* 49* 45*   Cardiac Enzymes: No results for input(s): CKTOTAL, CKMB, CKMBINDEX, TROPONINI in the last 168 hours. BNP: BNP (last 3 results)  Recent Labs  08/09/14 1459  BNP 178.0*    ProBNP (last 3 results) No results for input(s): PROBNP in the last 8760 hours.  CBG:  Recent Labs Lab 08/17/14 0748 08/17/14 1113 08/17/14 1631 08/17/14 2153 08/18/14 0801  GLUCAP 123* 144* 146* 167* 134*       Signed:  HERNANDEZ ACOSTA,[name]  [facility] Pager: [phone] 08/18/2014, 11:42 AM

## 2014-08-18 NOTE — Clinical Social Work Placement (Signed)
   CLINICAL SOCIAL WORK PLACEMENT  NOTE  Date:  08/18/2014  Patient Details  Name: Marissa Figueroa MRN: [id number] Date of Birth: [birth year]  Clinical Social Work is seeking post-discharge placement for this patient at the Skilled  Nursing Facility level of care (*CSW will initial, date and re-position this form in  chart as items are completed):  Yes   Patient/family provided with [place] Clinical Social Work Department's list of facilities offering this level of care within the geographic area requested by the patient (or if unable, by the patient's family).  Yes   Patient/family informed of their freedom to choose among providers that offer the needed level of care, that participate in Medicare, Medicaid or managed care program needed by the patient, have an available bed and are willing to accept the patient.  Yes   Patient/family informed of [facility]'s ownership interest in [facility] and [facility], as well as of the fact that they are under no obligation to receive care at these facilities.  PASRR submitted to EDS on 08/11/14     PASRR number received on 08/11/14     Existing PASRR number confirmed on       FL2 transmitted to all facilities in geographic area requested by pt/family on 08/11/14     FL2 transmitted to all facilities within larger geographic area on       Patient informed that his/her managed care company has contracts with or will negotiate with certain facilities, including the following:        Yes   Patient/family informed of bed offers received.  [facility]     Physician recommends and patient chooses bed at      [facility]18[facility].  Patient to be transferred to facility by staff     Patient family notified on 08/18/14 of transfer.  Name of family member notified:  Michelle-daughter     PHYSICIAN       Additional Comment:     _______________________________________________ Karn CassisStultz, Taliesin [place], LCSW 08/18/2014, 12:06 PM [phone]

## 2014-08-19 ENCOUNTER — Non-Acute Institutional Stay (SKILLED_NURSING_FACILITY): Payer: Medicare Other | Admitting: Internal Medicine

## 2014-08-19 ENCOUNTER — Ambulatory Visit (HOSPITAL_COMMUNITY)
Admission: RE | Admit: 2014-08-19 | Discharge: 2014-08-19 | Disposition: A | Discharge status: Home / Self Care | Payer: No Typology Code available for payment source | Source: Ambulatory Visit | Attending: Internal Medicine | Admitting: Internal Medicine

## 2014-08-19 ENCOUNTER — Other Ambulatory Visit: Payer: Self-pay | Admitting: Internal Medicine

## 2014-08-19 DIAGNOSIS — K7031 Alcoholic cirrhosis of liver with ascites: Secondary | ICD-10-CM | POA: Diagnosis not present

## 2014-08-19 DIAGNOSIS — D62 Acute posthemorrhagic anemia: Secondary | ICD-10-CM | POA: Diagnosis not present

## 2014-08-19 DIAGNOSIS — R609 Edema, unspecified: Secondary | ICD-10-CM

## 2014-08-19 DIAGNOSIS — F1010 Alcohol abuse, uncomplicated: Secondary | ICD-10-CM

## 2014-08-19 DIAGNOSIS — K7460 Unspecified cirrhosis of liver: Secondary | ICD-10-CM | POA: Insufficient documentation

## 2014-08-19 DIAGNOSIS — D696 Thrombocytopenia, unspecified: Secondary | ICD-10-CM

## 2014-08-19 DIAGNOSIS — M7989 Other specified soft tissue disorders: Secondary | ICD-10-CM | POA: Insufficient documentation

## 2014-08-19 DIAGNOSIS — R600 Localized edema: Secondary | ICD-10-CM

## 2014-08-19 NOTE — Progress Notes (Signed)
Patient ID: Marissa Figueroa, female   DOB: 06-13-1955, 59 y.o.   MRN: [id number]   This is an acute visit.  Level care skilled.  Facility [facility].  Chief complaint-acute visit status post hospitalization for GI bleed-acute blood loss anemia-decompensated alcoholic cirrhosis with acute alcoholic hepatitis.  History of present illness.  Patient is a 59 year old female with a complex medical history including history of alcohol excess cirrhosis-she presented to hospital with bloody emesis.  There had been repeated episodes.  She also has swelling of her abdomen.  She had been actively drinking alcohol apparently had been sober for 3 years but began drinking again a few months ago.  She underwent an EGD that showed portal gastropathy suspected Mallory-Weiss tear her hemoglobin did remain stable in the 8.59 range there was no indication for transfusion.  Regards to the alcoholic cirrhosis with acute alcoholic hepatitis she was started on prednisone currently on a taper.  She also received albumin and Lasix and attempts to diurese her.  Also received a platelet transfusion.  Platelets 45,000 on discharge yesterday which appears to be stabilized.  She also completed a course of Rocephin for Escherichia coli UTI.  Regards to alcohol abuse there were no signs of withdrawal in the hospital she continues on thiamine and folate.  Her stay has been somewhat complicated with falls apparently she filled the hospital-and fell last night and was noted to have some increased right hand edema which apparently is improved today.  An x-ray was negative for any acute process.  She does have an edematous entire right arm she says this has been the case throughout her hospitalization-there is some tenderness here-it is not overtly erythematous although she has generalized jaundice-type appearance.  Previous medical history.  History of alcohol abuse.  Alcoholic cirrhosis of the liver  with ascites.  Blood in emesis.  Hyponatremia.  Bilirubinemia.  Thrombocytopenia.  Macrocytic anemia.  UTI.  Past surgical history.  Tubal ligation-tonsillectomy adenoidectomy .  History of paracentesis.  Social history apparently she is a current smoker and had been drinking alcohol as noted above-she denies illicit drug use apparently there has been some use of marijuana in the past.  Family history-negative for any liver disease.  Medications.  B complex vitamin daily.  Calcium and vitamin D twice a day.  4 gastric acid 1 mg daily.  Lasix 40 mg daily.  Lactulose 10 mL equal 10 g twice a day.  Ativan 0.5 mg every 8 hours.  Multivitamin daily.  Protonic 40 mg twice a day.  Prednisone 30 mg daily with breakfast this will be tapered down.  Spironolactone 50 mg daily.  Thiamine 100 mg daily.  Review of systems.  In general denies any fever or chills does complain of weakness.  Skin-does not complain of rashes or itching does have general jaundiced appearance has some chronic bruising.  Head ears eyes nose mouth and throat-does not really complain of any visual changes or sore throat.  Respiratory has an occasional cough she says this is not new occasional shortness of breath she says this is not new either.  Cardiac not complaining of chest pain as some chronic lower extremity edema as well as edema of her right arm as noted above.  GI is not complaining of abdominal discomfort and diarrhea nausea or vomiting currently does have a significant history of cirrhosis and ascites.  GU does not complain of dysuria.  Musculoskeletal is not really complaining of joint pain other than some right arm discomfort again  she did have a fall last night and has had falls apparently in the hospital as well there is limited range of motion of her shoulders bilaterally.  Neurologic does not complain of dizziness headache numbness or syncopal-type episodes.  Psych does  not complain specifically of depression does have some history of anxiety.  Physical exam.  In general this is a pleasant somewhat obese middle-aged female who looks somewhat older than her stated age.  Her skin is warm and dry she does appear to have some chronic. Bruising-jaundiced generalized appearance.  Eyes pupils appear reactive to light visual acuity appears grossly intact sclera is slightly icteric.  Her oropharynx is clear mucous membranes moist.  Chest is clear to auscultation with shallow air entry there is no labored breathing.  Heart is regular rate and rhythm without murmur gallop or rub--appears to have some edema of her lower extremities  suspect this is chronic--pedal pulses are intact bilaterally. Has 2-3 plus edema of her right arm and 1 plus hand-radial pulse is intact  Abdomen is obese soft mildly tender to palpation there are positive bowel sounds.  Musculoskeletal generalized weakness limited range of motion of her shoulders bilaterally -grip strength appears to be intact I do note significant edema of her entire right arm as well as her hand according to nursing the edema on her hand actually is better today than it was yesterday radial pulse is intact.  There is some tenderness to palpation of the right arm-she is able to sit up without assistance and transfer to the wheelchair-is able to move all extremities 4 again with significant lower extremity weakness  Neurologic is grossly intact--her speech is clear no no lateralizing findings.  Psych she is alert and oriented pleasant and appropriate--somewhat of a flat affect.  Labs.  08/18/2014.  Sodium 137 potassium 3.9 BUN 8 creatinine 0.73.  Albumin 2.5 AST 50 Alk, phosphatase 140 total bilirubin 9.3.  WBC 7.5 hemoglobin 8.5 platelets 45,000  Assessment and plan.  #1-history of GI bleed it to blood loss anemia--- blood in emesis-there are been no further episodes-hemoglobin has been stable this will  need to be rechecked next laboratory day-clinically she appears to be at baseline here with history of alcohol abuse she continues on thiamine and folate as well as a proton pump inhibitor.  #2 history of alcoholic cirrhosis with elevated liver function tests this will need to be rechecked as well-she is on a prednisone taper for slow taper starting at 30 mg-she received albumin in the hospital continues on Lasix as well as spironolactone-will have to keep an eye on her renal function and electrolytes.  She continues on lactulose as well.  #3 history of right arm edema-apparently this has been chronic somewhat during her hospitalization-will order a venous Doppler to rule out any DVT again x-rays of her right hand were unremarkable last night-apparently edema of her hand is somewhat improved today.  #4 history of generalized weakness-she will need extensive therapy apparently she lives with a family member-but she has significant weakness and would be a significant fall risk if she went home currently.  #5-history UTIs she's completed Rocephin for an Escherichia coli UTI she appears to be asymptomatic currently.  #6-thrombocytopenia-again she did receive a transfusion in the hospital this will need to be rechecked within the next several days  [id number]-of note greater than 40 minutes spent assessing patient-discussing her status with nursing staff-reviewing her chart-and coordinating and formulating a plan of care for numerous diagnoses-of note greater than 50%  of time spent coordinating plan of care

## 2014-08-19 NOTE — Care Management Utilization Note (Signed)
UR completed 

## 2014-08-20 ENCOUNTER — Encounter (HOSPITAL_COMMUNITY)
Admission: RE | Admit: 2014-08-20 | Discharge: 2014-08-20 | Disposition: A | Discharge status: Home / Self Care | Payer: Medicare Other | Source: Skilled Nursing Facility | Attending: Internal Medicine | Admitting: Internal Medicine

## 2014-08-20 LAB — COMPREHENSIVE METABOLIC PANEL
ALT: 31 U/L (ref 14–54)
AST: 53 U/L — ABNORMAL HIGH (ref 15–41)
Albumin: 2.1 g/dL — ABNORMAL LOW (ref 3.5–5.0)
Alkaline Phosphatase: 127 U/L — ABNORMAL HIGH (ref 38–126)
Anion gap: 5 (ref 5–15)
BUN: 10 mg/dL (ref 6–20)
CO2: 33 mmol/L — ABNORMAL HIGH (ref 22–32)
Calcium: 9.2 mg/dL (ref 8.9–10.3)
Chloride: 100 mmol/L — ABNORMAL LOW (ref 101–111)
Creatinine, Ser: 0.8 mg/dL (ref 0.44–1.00)
GFR calc Af Amer: >60 mL/min (ref >60)
GFR calc non Af Amer: >60 mL/min (ref >60)
Glucose, Bld: 122 mg/dL — ABNORMAL HIGH (ref 70–99)
Potassium: 3.5 mmol/L (ref 3.5–5.1)
Sodium: 138 mmol/L (ref 135–145)
Total Bilirubin: 8.3 mg/dL — ABNORMAL HIGH (ref 0.3–1.2)
Total Protein: 5.5 g/dL — ABNORMAL LOW (ref 6.5–8.1)

## 2014-08-20 LAB — CBC
HEMATOCRIT: 22.2 % — AB (ref 36.0–46.0)
HEMOGLOBIN: 7 g/dL — AB (ref 12.0–15.0)
MCH: 35.5 pg — AB (ref 26.0–34.0)
MCHC: 31.5 g/dL (ref 30.0–36.0)
MCV: 112.7 fL — AB (ref 78.0–100.0)
PLATELETS: 40 10*3/uL — AB (ref 150–400)
RBC: 1.97 MIL/uL — AB (ref 3.87–5.11)
RDW: 21.1 % — ABNORMAL HIGH (ref 11.5–15.5)
WBC: 7.3 10*3/uL (ref 4.0–10.5)

## 2014-08-21 ENCOUNTER — Non-Acute Institutional Stay (SKILLED_NURSING_FACILITY): Payer: Medicare Other | Admitting: Internal Medicine

## 2014-08-21 DIAGNOSIS — K7682 Hepatic encephalopathy: Secondary | ICD-10-CM

## 2014-08-21 DIAGNOSIS — K7031 Alcoholic cirrhosis of liver with ascites: Secondary | ICD-10-CM | POA: Diagnosis not present

## 2014-08-21 DIAGNOSIS — K7290 Hepatic failure, unspecified without coma: Secondary | ICD-10-CM | POA: Diagnosis not present

## 2014-08-21 DIAGNOSIS — K922 Gastrointestinal hemorrhage, unspecified: Secondary | ICD-10-CM

## 2014-08-21 DIAGNOSIS — K7011 Alcoholic hepatitis with ascites: Secondary | ICD-10-CM

## 2014-08-21 NOTE — Progress Notes (Signed)
Patient ID: Marissa Figueroa, female   DOB: 02/02/1956, 59 y.o.   MRN: [id number]  Facility; [facility] Chief complaint; admission to SNF post admit to [facility] from 4/26 to 4/5  History; this is a 59 year old woman with unknown cirrhosis of the liver secondary to alcohol. She was admitted to hospital with hematemesis at least 2. EGD by [facility] showed evidence of portal gastropathy but no varices ulcerations or other abnormalities in the esophagus stomach or duodenum. It was felt that she may have had a Mallory-Weiss tear that it healed over. She is on a proton pump inhibitor. Consider colonoscopy at a later date. Ultrasound of the abdomen showed a small amount of ascites which was not amenable to paracentesis. She was noted to have decompensated alcoholic cirrhosis and was started on prednisone with a taper outlined. She was profoundly hypoalbuminemic and received albumin and Lasix and she was also transfuse platelets on 5/3. Urine grew Escherichia coli sensitive to Rocephin completing treatment on 5/3. No signs of withdrawal.  Since her arrival here her hemoglobin is already dropped to 7 as of yesterday. It is not clear that she's had any further bleeding. Total bilirubin was up over 8 [see below]  CBC Latest Ref Rng 08/20/2014 08/18/2014 08/17/2014  WBC 4.0 - 10.5 K/uL 7.3 7.5 7.6  Hemoglobin 12.0 - 15.0 g/dL 7.0(L) 8.5(L) 9.3(L)  Hematocrit 36.0 - 46.0 % 22.2(L) 26.7(L) 29.2(L)  Platelets 150 - 400 K/uL 40(L) 45(L) 49(L)     Past Medical History  Diagnosis Date  . Cirrhosis     ALCOHOLIC  . Alcoholic cirrhosis of liver   . CHF (congestive heart failure)   . Shortness of breath     exertion    Past Surgical History  Procedure Laterality Date  . Tubal ligation    . Tonsilectomy, adenoidectomy, bilateral myringotomy and tubes    . Paracentesis  07/06/08  . Paracentesis  05/25/2008  . Esophagogastroduodenoscopy (egd) with propofol N/A 08/15/2014    Procedure: ESOPHAGOGASTRODUODENOSCOPY  (EGD) WITH PROPOFOL;  Surgeon: Rogene Houston, MD;  Location: [facility];  Service: Endoscopy;  Laterality: N/A;    Current Outpatient Prescriptions on File Prior to Visit  Medication Sig Dispense Refill  . B Complex-Biotin-FA (B-COMPLEX PO) Take 1 tablet by mouth daily.    . Calcium Carbonate-Vitamin D (CALCIUM 600+D) 600-400 MG-UNIT per tablet Take 1 tablet by mouth 2 (two) times daily.    . folic acid (FOLVITE) 1 MG tablet Take 1 tablet (1 mg total) by mouth daily.    . furosemide (LASIX) 40 MG tablet Take 1 tablet (40 mg total) by mouth daily. 30 tablet   . lactulose (CHRONULAC) 10 GM/15ML solution Take 15 mLs (10 g total) by mouth 2 (two) times daily. 960 mL 5  . LORazepam (ATIVAN) 0.5 MG tablet Take 1 tablet (0.5 mg total) by mouth every 8 (eight) hours. 45 tablet 0  . Multiple Vitamin (MULTIVITAMIN WITH MINERALS) TABS tablet Take 1 tablet by mouth daily.    . pantoprazole (PROTONIX) 40 MG tablet Take 1 tablet (40 mg total) by mouth 2 (two) times daily before a meal.    . predniSONE (DELTASONE) 10 MG tablet Take 3 tablets (30 mg total) by mouth daily with breakfast.    . spironolactone (ALDACTONE) 25 MG tablet Take 50 mg by mouth daily.    [name] Kitchen thiamine 100 MG tablet Take 1 tablet (100 mg total) by mouth daily.      Social; the patient tells me she lives in  [place] in her own apartment. She claims to be independent with ADLs and IDL's. Recently started to drink again which she freely admits  reports that she has been smoking.  She has never used smokeless tobacco. She reports that she drinks alcohol. She reports that she does not use illicit drugs.  Fam hx; indicated that her mother is deceased. She indicated that her father is deceased. She indicated that her sister is alive. She indicated that her brother is alive.   Review of systems; Respiratory patient denies shortness of breath Cardiac no chest pain Abdomen no abdominal pain no further nausea vomiting GU no  dysuria Musculoskeletal; she tells me she fell in the hospital on Friday and since then has had severe right arm pain and swelling  Physical examination Gen. chronically ill-looking woman, jaundiced but responsive HEENT; scleral icterus noted no oral lesions Respiratory clear entry bilaterally Cardiac heart sounds are normal no murmurs she appears to be euvolemic Abdomen; slightly distended no shifting dullness no tenderness no liver no spleen she has no asterixis multiple abdominal scars of uncertain etiology Musculoskeletal; there is extensive bruising in the right arm with swelling especially of the hand she has a lot of pain here however her peripheral pulses are intact there is no evidence of arterial compromise. She is most tenderness over the right wrist. She seems to be able to move the right shoulder and elbow. Neurologic other than the right arm she is able to move all her limbs. Her exam is not focal. Mental status; I suspect there is underlying encephalopathy here although this is probably mild  Impression/plan #1 upper GI bleed with endoscopy noted above. Her hemoglobin is fallen to 7 yesterday. This will need to be rechecked tomorrow. There is not an overt source of bleeding. I wonder if some of this blood losses into the right arm. There are many other reasons for people with decompensated the cirrhosis to be anemic. Nevertheless she may need transfusion. Continue on Protonix #2 probable continued hepatic cirrhosis with encephalopathy she is on lactulose 15 twice a day. Monitor her bowel movement frequency; she is auto anticoagulated #3 fluid overload state. She is on Lasix 40 as well as Aldactone. We will need to monitor her weights and electrolytes. #4 acute alcoholic hepatitis on a prednisone taper #5 trauma to the right arm which she says happened 2 days ago. The bruising looks older than that. I note she had x-rays of the right wrist and elbow which were negative. #6 I am not  certain of the indication for Ativan in this setting this may need to be gradually withdrawn #7 thrombocytopenia she did receive a platelet transfusion. This is no doubt secondary to portal hypertension #8 treated for a UTI there is no evidence of this currently.  Lab work will be repeated tomorrow. She may need transfusion, she was not transfused in the hospital. I think there is continued evidence of hepatic encephalopathy although there is no asterixis. We will monitor this clinically.   Results for Marissa Figueroa, Marissa Figueroa (MRN [id number]) as of 08/21/2014 08:33  Ref. Range 08/14/2014 08:00 08/14/2014 08:00 08/15/2014 09:03 08/16/2014 06:15 08/16/2014 10:46 08/17/2014 06:20 08/18/2014 07:35 08/20/2014 07:50  Sodium Latest Ref Range: 135-145 mmol/L   139 137   137 138  Potassium Latest Ref Range: 3.5-5.1 mmol/L   3.2 (L) 3.7   3.9 3.5  Chloride Latest Ref Range: 101-111 mmol/L   104 103   100 (L) 100 (L)  CO2 Latest Ref Range:  22-32 mmol/L   '30 30   31 '$ 33 (H)  Mean Plasma Glucose Latest Units: mg/dL    80      BUN Latest Ref Range: 6-20 mg/dL   '10 8   8 10  '$ Creatinine Latest Ref Range: 0.44-1.00 mg/dL   0.68 0.77   0.73 0.80  Calcium Latest Ref Range: 8.9-10.3 mg/dL   8.0 (L) 7.8 (L)   9.1 9.2  EGFR (Non-African Amer.) Latest Ref Range: >60 mL/min   >60 >60   >60 >60  EGFR (African American) Latest Ref Range: >60 mL/min   >60 >60   >60 >60  Glucose Latest Ref Range: 70-99 mg/dL   110 (H) 115 (H)   136 (H) 122 (H)  Anion gap Latest Ref Range: 5-$RemoveBefo'15    5 4 'khFWPNdohLM$ (L)   6 5  Alkaline Phosphatase Latest Ref Range: 38-126 U/L    118   140 (H) 127 (H)  Albumin Latest Ref Range: 3.5-5.0 g/dL    1.5 (L)   2.5 (L) 2.1 (L)  AST Latest Ref Range: 15-41 U/L    50 (H)   51 (H) 53 (H)  ALT Latest Ref Range: 14-54 U/L    '28   31 31  '$ Total Protein Latest Ref Range: 6.5-8.1 g/dL    5.2 (L)   6.5 5.5 (L)  Total Bilirubin Latest Ref Range: 0.3-1.2 mg/dL    7.7 (H)   9.3 (H) 8.3 (H)  WBC Latest Ref Range: 4.0-10.5 K/uL   3.7 (L) 4.2   7.6 7.5 7.3  RBC Latest Ref Range: 3.87-5.11 MIL/uL   2.45 (L) 2.33 (L)  2.66 (L) 2.44 (L) 1.97 (L)  Hemoglobin Latest Ref Range: 12.0-15.0 g/dL   8.5 (L) 8.2 (L)  9.3 (L) 8.5 (L) 7.0 (L)  HCT Latest Ref Range: 36.0-46.0 %   26.7 (L) 25.4 (L)  29.2 (L) 26.7 (L) 22.2 (L)  MCV Latest Ref Range: 78.0-100.0 fL   109.0 (H) 109.0 (H)  109.8 (H) 109.4 (H) 112.7 (H)  MCH Latest Ref Range: 26.0-34.0 pg   34.7 (H) 35.2 (H)  35.0 (H) 34.8 (H) 35.5 (H)  MCHC Latest Ref Range: 30.0-36.0 g/dL   31.8 32.3  31.8 31.8 31.5  RDW Latest Ref Range: 11.5-15.5 %   19.3 (H) 19.3 (H)  19.3 (H) 19.7 (H) 21.1 (H)  Platelets Latest Ref Range: 150-400 K/uL   37 (L) 34 (L)  49 (L) 45 (L) 40 (L)  Prothrombin Time Latest Ref Range: 11.6-15.2 seconds   29.7 (H) 31.3 (H)  29.5 (H)    INR Latest Ref Range: 0.00-1.49    2.79 (H) 2.99 (H)  2.77 (H)    Hemoglobin A1C Latest Ref Range: 4.8-5.6 %    4.4 (L)      Unit Number Unknown [id number]   [id number]     Blood Component Type Unknown THAWED PLASMA THW PLS APHR   PLTP LR1 PAS     Unit division Unknown 00 00   00     Status of Unit Unknown ISSUED,FINAL ISSUED,FINAL   ISSUED,FINAL     Transfusion Status Unknown OK TO TRANSFUSE OK TO TRANSFUSE   OK TO TRANSFUSE

## 2014-08-22 ENCOUNTER — Encounter (HOSPITAL_COMMUNITY): Payer: Medicare Other | Attending: Internal Medicine

## 2014-08-22 ENCOUNTER — Encounter (HOSPITAL_COMMUNITY)
Admission: RE | Admit: 2014-08-22 | Discharge: 2014-08-22 | Disposition: A | Discharge status: Home / Self Care | Payer: Medicare Other | Source: Skilled Nursing Facility | Attending: Internal Medicine | Admitting: Internal Medicine

## 2014-08-22 ENCOUNTER — Ambulatory Visit (HOSPITAL_COMMUNITY): Payer: Medicare Other

## 2014-08-22 ENCOUNTER — Non-Acute Institutional Stay (SKILLED_NURSING_FACILITY): Payer: Medicare Other | Admitting: Internal Medicine

## 2014-08-22 ENCOUNTER — Ambulatory Visit (HOSPITAL_COMMUNITY): Payer: Medicare Other | Attending: Internal Medicine

## 2014-08-22 DIAGNOSIS — D62 Acute posthemorrhagic anemia: Secondary | ICD-10-CM

## 2014-08-22 DIAGNOSIS — K7031 Alcoholic cirrhosis of liver with ascites: Secondary | ICD-10-CM

## 2014-08-22 LAB — CBC
HEMATOCRIT: 20.9 % — AB (ref 36.0–46.0)
Hemoglobin: 6.2 g/dL — CL (ref 12.0–15.0)
MCH: 33.7 pg (ref 26.0–34.0)
MCHC: 29.7 g/dL — ABNORMAL LOW (ref 30.0–36.0)
MCV: 113.6 fL — ABNORMAL HIGH (ref 78.0–100.0)
Platelets: 33 10*3/uL — ABNORMAL LOW (ref 150–400)
RBC: 1.84 MIL/uL — AB (ref 3.87–5.11)
RDW: 21.4 % — ABNORMAL HIGH (ref 11.5–15.5)
WBC: 5.5 10*3/uL (ref 4.0–10.5)

## 2014-08-22 LAB — BASIC METABOLIC PANEL
Anion gap: 9 (ref 5–15)
BUN: 15 mg/dL (ref 6–20)
CO2: 33 mmol/L — ABNORMAL HIGH (ref 22–32)
Calcium: 9 mg/dL (ref 8.9–10.3)
Chloride: 98 mmol/L — ABNORMAL LOW (ref 101–111)
Creatinine, Ser: 0.8 mg/dL (ref 0.44–1.00)
GFR calc Af Amer: >60 mL/min (ref >60)
GFR calc non Af Amer: >60 mL/min (ref >60)
Glucose, Bld: 125 mg/dL — ABNORMAL HIGH (ref 70–99)
Potassium: 3.3 mmol/L — ABNORMAL LOW (ref 3.5–5.1)
Sodium: 140 mmol/L (ref 135–145)

## 2014-08-22 LAB — PREPARE RBC (CROSSMATCH)

## 2014-08-22 MED ORDER — FUROSEMIDE 10 MG/ML IJ SOLN
20.0000 mg | Freq: Once | INTRAMUSCULAR | Status: AC
  Administered 2014-08-22: 20 mg via INTRAVENOUS

## 2014-08-22 MED ORDER — SODIUM CHLORIDE 0.9 % IV SOLN
Freq: Once | INTRAVENOUS | Status: AC
  Administered 2014-08-22: 13:00:00 via INTRAVENOUS

## 2014-08-22 NOTE — Progress Notes (Signed)
sppoke with dr Gordy Levanrobeson regarding patient status/..ok to not draw blood work after 2nd unit just send patient back to [facility].

## 2014-08-22 NOTE — Progress Notes (Signed)
Results for Marissa Figueroa, Marissa Figueroa (MRN [id number]) as of 08/22/2014 16:19  Ref. Range 08/22/2014 10:00  Unit Number Unknown [id number]  Blood ComponentResults for Marissa Figueroa, Marissa Figueroa (MRN [id number]) as of 08/22/2014 16:19  Type Unknown RED CELLS,LR  Unit division Unknown 00  Status of Unit Unknown ISSUED  Transfusion Status Unknown OK TO TRANSFUSE  Crossmatch Result Unknown Compatible

## 2014-08-23 ENCOUNTER — Other Ambulatory Visit (HOSPITAL_COMMUNITY)
Admission: RE | Admit: 2014-08-23 | Discharge: 2014-08-23 | Disposition: A | Discharge status: Home / Self Care | Payer: Medicare Other | Source: Skilled Nursing Facility | Attending: Internal Medicine | Admitting: Internal Medicine

## 2014-08-23 ENCOUNTER — Encounter: Payer: Self-pay | Admitting: Internal Medicine

## 2014-08-23 ENCOUNTER — Ambulatory Visit (HOSPITAL_COMMUNITY): Payer: Medicare Other

## 2014-08-23 ENCOUNTER — Non-Acute Institutional Stay (SKILLED_NURSING_FACILITY): Payer: Medicare Other | Admitting: Internal Medicine

## 2014-08-23 DIAGNOSIS — K7031 Alcoholic cirrhosis of liver with ascites: Secondary | ICD-10-CM | POA: Diagnosis not present

## 2014-08-23 DIAGNOSIS — M79621 Pain in right upper arm: Secondary | ICD-10-CM

## 2014-08-23 DIAGNOSIS — M25529 Pain in unspecified elbow: Secondary | ICD-10-CM | POA: Insufficient documentation

## 2014-08-23 DIAGNOSIS — Z09 Encounter for follow-up examination after completed treatment for conditions other than malignant neoplasm: Secondary | ICD-10-CM | POA: Insufficient documentation

## 2014-08-23 DIAGNOSIS — M25521 Pain in right elbow: Secondary | ICD-10-CM

## 2014-08-23 DIAGNOSIS — D62 Acute posthemorrhagic anemia: Secondary | ICD-10-CM | POA: Diagnosis not present

## 2014-08-23 DIAGNOSIS — G9340 Encephalopathy, unspecified: Secondary | ICD-10-CM | POA: Diagnosis not present

## 2014-08-23 DIAGNOSIS — E876 Hypokalemia: Secondary | ICD-10-CM

## 2014-08-23 LAB — TYPE AND SCREEN
ABO/RH(D): O POS
Antibody Screen: NEGATIVE
Unit division: 0
Unit division: 0

## 2014-08-23 LAB — BASIC METABOLIC PANEL
Anion gap: 8 (ref 5–15)
BUN: 14 mg/dL (ref 6–20)
CHLORIDE: 98 mmol/L — AB (ref 101–111)
CO2: 32 mmol/L (ref 22–32)
Calcium: 8.5 mg/dL — ABNORMAL LOW (ref 8.9–10.3)
Creatinine, Ser: 0.71 mg/dL (ref 0.44–1.00)
GFR calc non Af Amer: >60 mL/min (ref >60)
GLUCOSE: 120 mg/dL — AB (ref 70–99)
POTASSIUM: 3.2 mmol/L — AB (ref 3.5–5.1)
Sodium: 138 mmol/L (ref 135–145)

## 2014-08-23 LAB — CBC
HEMATOCRIT: 30.7 % — AB (ref 36.0–46.0)
HEMOGLOBIN: 9.5 g/dL — AB (ref 12.0–15.0)
MCH: 33.3 pg (ref 26.0–34.0)
MCHC: 30.9 g/dL (ref 30.0–36.0)
MCV: 107.7 fL — ABNORMAL HIGH (ref 78.0–100.0)
Platelets: 35 10*3/uL — ABNORMAL LOW (ref 150–400)
RBC: 2.85 MIL/uL — AB (ref 3.87–5.11)
RDW: 26.6 % — ABNORMAL HIGH (ref 11.5–15.5)
WBC: 6.9 10*3/uL (ref 4.0–10.5)

## 2014-08-23 LAB — OCCULT BLOOD X 1 CARD TO LAB, STOOL: FECAL OCCULT BLD: NEGATIVE

## 2014-08-23 NOTE — Progress Notes (Signed)
Patient ID: Marissa NicksChristine L Figueroa, female   DOB: [birth year], 59 y.o.   MRN: [id number]   This is an acute visit.  Level care skilled.  Facility [facility].  Chief complaint-acute visit secondary to multiple issues including encephalopathy-right arm discomfort-hypokalemia.  History of present illness.  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  She was admitted to the hospital with bloody emesis-she had evidence of portal gastropathy but no varices ulcerations or other abnormalities.  Was thought she may have had a Mallory-Weiss tear that healed over.  She is on a proton pump inhibitor.  She was noted to have decompensated alcoholic cirrhosis was started on a prednisone taper which he remains on.  She also received albumin secondary to severe hypobilirubinemia-and also Lasix.  As well as a platelet transfusion.  She also completed treatment for UTI.  Dr. Leanord Hawkingobson did see her yesterday and was noted her hemoglobin had dropped to 6.2-she received a transfusion.  Hemoglobin today has risen to 9.5.  I do note her potassium is 3.2 on lab done today.  She is on Aldactone as well as Lasix-.  She also has a significantly erythematous edematous right arm-this has been followed closely by Dr. Leanord Hawkingobson earlier this week-ultrasound of the arm was negative for any DVT x-ray of the arm does not show any acute process.  Apparently she is complain somewhat of pain to nursing staff-she does have a listed tramadol allergy-apparently does not do well with narcotics.  She did receive Tylenol earlier today apparently with some relief.  She's also appears to have some increased confusion and mental status changes-there are concerns for encephalopathy with her history-she is on lactulose 15 mL twice a day.  Somewhat unclear if she's had many bowel movements over the last 48 hours it does not appear she's had frequent bowel movements  certainly.  Currently she appears to be resting in bed comfortably she is alert and responsive although somewhat confused sometimes somewhat slow to answer although this is not totally new presentation.  Family medical social history as been reviewed per admission note on 08/21/2014.  Medications have been reviewed per MAR.  Review of systems.  Gen. she is not complaining of any fever or chills.  Respiratory is not complaining of shortness breath or cough.  Cardiac no chest pain.  GI he is not complaining of abdominal discomfort again does not appear she's having frequent bowel movements.  Musculoskeletal she continues at times to complain of right arm discomfort however she describes that to me as more soreness than acute pain.  Neurologic does not complain of dizziness or headache.  Physical exam.  Temperature is 97.1 pulse 80 respirations 20 blood pressure 99/58 weight is 228.2 scales are accurate this is a loss of about 20 pounds since admission although not totally sure this is accurate.  In general this is a chronically ill-appearing middle-aged lady who looks somewhat older than her stated days-she continues with a jaundiced appearance.  Oropharynx is clear mucous membranes moist.  Eye--icterus sclera  Chest is clear to auscultation with somewhat shallow air entry no labored breathing.  Heart is regular rate and rhythm without murmur gallop or rub she has mild lower extremity edema.  Abdomen continues to be somewhat distended peers baseline with previous exam there are positive bowel sounds.  Musculoskeletal continues to have extensive bruising of her right arm this is very dark erythema with continued edema however according to nursing this appears to  be somewhat improved from yesterday-she does have a radial pulse.  Neurologic-appears able to move all her extremities at baseline.  Psych she is oriented to self is appropriate when asked questions with somewhat slow  to respond one would question some encephalopathy especially with her limited bowel movements.  Labs.  08/23/2014.  WBC 6.9 hemoglobin 9.5 platelets 35,000.  Sodium 138 potassium 3.2 BUN 14 creatinine 0.71.  Yesterday her hemoglobin was 6.2.  Assessment and plan.  #1-anemia required transfusion-hemoglobin has responded to transfusion at 9.5-platelets continue to be low around 35,000 but this has been her baseline recently continue to monitor with serial labs.  #2 pain management right arm-challenging situation-she does have a listed allergy to tramadol she says this gives her migraines are is also some questionable history of throat swelling although this is somewhat unclear-would be hesitant to do any narcotics-will treat with Tylenol 650 mg every 6 hours when necessary-Tylenol use will have to be monitored with her history of cirrhosis-again this is a challenging situation  #3 hypokalemia-I do note she is on Aldactone as well as Lasix-will give potassium 40 mEq today and  tomorrow and recheck a metabolic panel on Thursday.  #4-history of encephalopathy with cirrhosis-will increase her lactulose to 30 mL 3 times a day and have her bowel movements monitored closely.  [id number]--of note more than 40 minutes spent assessing patient reviewing her chart-extensive discussion with nursing staff about her status especially in regards to her arm-number of bowel movements and mental status-and coordinating and formulating a plan of care for numerous diagnoses-of note greater than 50% of time spent coordinating plan of care

## 2014-08-24 ENCOUNTER — Non-Acute Institutional Stay (SKILLED_NURSING_FACILITY): Payer: Medicare Other | Admitting: Internal Medicine

## 2014-08-24 ENCOUNTER — Telehealth (INDEPENDENT_AMBULATORY_CARE_PROVIDER_SITE_OTHER): Payer: Self-pay | Admitting: *Deleted

## 2014-08-24 DIAGNOSIS — K7031 Alcoholic cirrhosis of liver with ascites: Secondary | ICD-10-CM

## 2014-08-24 DIAGNOSIS — K7682 Hepatic encephalopathy: Secondary | ICD-10-CM

## 2014-08-24 DIAGNOSIS — G9340 Encephalopathy, unspecified: Secondary | ICD-10-CM | POA: Diagnosis not present

## 2014-08-24 DIAGNOSIS — K7290 Hepatic failure, unspecified without coma: Secondary | ICD-10-CM

## 2014-08-24 NOTE — Progress Notes (Signed)
Patient ID: Marissa Figueroa, female   DOB: 02/03/1956, 59 y.o.   MRN: [id number] Faciltiy: [facility] CC: increased confusion/refusals of care/anemia History; this is a patient who was admitted to hospital with hematemesis. She has a history of hepatic cirrhosis said to be secondary to all call. She had an endoscopy that showed portal gastropathy but no Mallory-Weiss tear or varices. Ultrasound of the abdomen showed a small amount ascites which was not amenable to paracentesis. She came to [place] on lactulose 15 cc twice a day. When I saw her on 5/5 I still felt she was probably in mild encephalopathy. I was called yesterday to report that the patient did had no bowel movements. When we increased her lactulose to 30 mL said 3 times a day and apparently this morning she has not had any bowel movements that are recorded either  Also after her arrival here her hemoglobin plummeted from 9.3 on 5/4 down to 7 at discharge she was 6.2 and she was transfused yesterday to a hemoglobin of 9.3. He did have increasing old bruising in her right arm may be some of the blood loss here. CBC Latest Ref Rng 08/23/2014 08/22/2014 08/20/2014  WBC 4.0 - 10.5 K/uL 6.9 5.5 7.3  Hemoglobin 12.0 - 15.0 g/dL 9.5(L) 6.2(LL) 7.0(L)  Hematocrit 36.0 - 46.0 % 30.7(L) 20.9(L) 22.2(L)  Platelets 150 - 400 K/uL 35(L) 33(L) 40(L)   Lab Results  Component Value Date   CREATININE 0.71 08/23/2014   CREATININE 0.80 08/22/2014   CREATININE 0.80 08/20/2014   Current Outpatient Prescriptions on File Prior to Visit  Medication Sig Dispense Refill  . B Complex-Biotin-FA (B-COMPLEX PO) Take 1 tablet by mouth daily.    . Calcium Carbonate-Vitamin D (CALCIUM 600+D) 600-400 MG-UNIT per tablet Take 1 tablet by mouth 2 (two) times daily.    . folic acid (FOLVITE) 1 MG tablet Take 1 tablet (1 mg total) by mouth daily.    . furosemide (LASIX) 40 MG tablet Take 1 tablet (40 mg total) by mouth daily. 30 tablet   . lactulose (CHRONULAC) 10 GM/15ML  solution Take 15 mLs (10 g total) by mouth 2 (two) times daily. 960 mL 5  . LORazepam (ATIVAN) 0.5 MG tablet Take 1 tablet (0.5 mg total) by mouth every 8 (eight) hours. 45 tablet 0  . Multiple Vitamin (MULTIVITAMIN WITH MINERALS) TABS tablet Take 1 tablet by mouth daily.    . pantoprazole (PROTONIX) 40 MG tablet Take 1 tablet (40 mg total) by mouth 2 (two) times daily before a meal.    . predniSONE (DELTASONE) 10 MG tablet Take 3 tablets (30 mg total) by mouth daily with breakfast.    . spironolactone (ALDACTONE) 25 MG tablet Take 50 mg by mouth daily.    [name] Kitchen thiamine 100 MG tablet Take 1 tablet (100 mg total) by mouth daily.      Review of systems Gen. I am not able to get anything from the patient. The staff states she is refusing care refusing incontinence care.  Physical examination; Gen. the patient looks more jaundiced than on admission. Blood pressure 99/58 respirations 20 pulse 81 temperature 97.5 her weight on 5/9 was 228.2 this was listed at 248.4 on her admission on 5/5 Respiratory; fairly clear air entry bilaterally Cardiac heart sounds are normal she does not appear to be grossly dehydrated. Abdomen; mildly distended no liver no spleen I think she has mild asterixis especially on the right. GU; no suprapubic or costovertebral angle tenderness. Musculoskeletal exam; extensive old  looking bruising of her right arm to above the elbow. This has progressed since she actually came here. There is nothing that looks threatening in terms of her vascular supply her hand is warm radial pulses strong  Impression/plan #1 alcoholic cirrhosis with alcoholic hepatitis and decompensated hepatic encephalopathy. I will make an attempt to try and treat this in the facility but I am not optimistic. She will need hourly lactulose. Stat electrolytes and hemoglobin #2 status post transfusion of 2 units of packed cells yesterday. Her follow-up hemoglobin was 9.3. Her should her stools have been consistently  guaiac negative including what I did myself on Monday. There are many reasons for decompensated anemia and people with chronic liver disease including homolysis etc. Do not see any obvious source of bleeding other than the right arm. Her abdomen is tender there is no bruising on her back etc. she is auto anticoagulated. Probably belongs on vitamin K #3 decompensated hepatic encephalopathy. Her Ativan will need to stop it. #4 if the weights in the chart to believe that she has lost 20 pounds in 4 days. I'll have them reweigh her she probably doesn't need her diuretics at this point   Results for Marissa Figueroa, Marissa Figueroa (MRN [id number]) as of 08/24/2014 09:19  Ref. Range 08/17/2014 06:20 08/18/2014 07:35 08/20/2014 07:50 08/22/2014 07:43 08/22/2014 10:00 08/22/2014 10:00 08/23/2014 11:50 08/23/2014 16:00  Sodium Latest Ref Range: 135-145 mmol/L  137 138 140   138   Potassium Latest Ref Range: 3.5-5.1 mmol/L  3.9 3.5 3.3 (L)   3.2 (L)   Chloride Latest Ref Range: 101-111 mmol/L  100 (L) 100 (L) 98 (L)   98 (L)   CO2 Latest Ref Range: 22-32 mmol/L  31 33 (H) 33 (H)   32   BUN Latest Ref Range: 6-20 mg/dL  _0 Creatinine Latest Ref Range: 0.44-1.00 mg/dL  0.73 0.80 0.80   0.71   Calcium Latest Ref Range: 8.9-10.3 mg/dL  9.1 9.2 9.0   8.5 (L)   EGFR (Non-African Amer.) Latest Ref Range: >60 mL/min  >60 >60 >60   >60   EGFR (African American) Latest Ref Range: >60 mL/min  >60 >60 >60   >60   Glucose Latest Ref Range: 70-99 mg/dL  136 (H) 122 (H) 125 (H)   120 (H)   Anion gap Latest Ref Range: 5-_1 Alkaline Phosphatase Latest Ref Range: 38-126 U/L  140 (H) 127 (H)       Albumin Latest Ref Range: 3.5-5.0 g/dL  2.5 (L) 2.1 (L)       AST Latest Ref Range: 15-41 U/L  51 (H) 53 (H)       ALT Latest Ref Range: 14-54 U/L  31 31       Total Protein Latest Ref Range: 6.5-8.1 g/dL  6.5 5.5 (L)       Total Bilirubin Latest Ref Range: 0.3-1.2 mg/dL  9.3 (H) 8.3 (H)       WBC Latest Ref Range: 4.0-10.5  K/uL 7.6 7.5 7.3 5.5   6.9   RBC Latest Ref Range: 3.87-5.11 MIL/uL 2.66 (L) 2.44 (L) 1.97 (L) 1.84 (L)   2.85 (L)   Hemoglobin Latest Ref Range: 12.0-15.0 g/dL 9.3 (L) 8.5 (L) 7.0 (L) 6.2 (LL)   9.5 (L)   HCT Latest Ref Range: 36.0-46.0 % 29.2 (L) 26.7 (L) 22.2 (L) 20.9 (L)   30.7 (L)   MCV Latest Ref Range:  78.0-100.0 fL 109.8 (H) 109.4 (H) 112.7 (H) 113.6 (H)   107.7 (H)   MCH Latest Ref Range: 26.0-34.0 pg 35.0 (H) 34.8 (H) 35.5 (H) 33.7   33.3   MCHC Latest Ref Range: 30.0-36.0 g/dL 31.8 31.8 31.5 29.7 (L)   30.9   RDW Latest Ref Range: 11.5-15.5 % 19.3 (H) 19.7 (H) 21.1 (H) 21.4 (H)   26.6 (H)   Platelets Latest Ref Range: 150-400 K/uL 49 (L) 45 (L) 40 (L) 33 (L)   35 (L)   Prothrombin Time Latest Ref Range: 11.6-15.2 seconds 29.5 (H)         INR Latest Ref Range: 0.00-1.49  2.77 (H)         Sample Expiration Unknown     08/25/2014     Antibody Screen Unknown     NEG     ABO/RH(D) Unknown     O POS     Unit Number Unknown     [id number]    Blood Component Type Unknown     RCLI PHER 1 RED CELLS,LR    Unit division Unknown     00 00    Status of Unit Unknown     ISSUED,FINAL ISSUED,FINAL    Transfusion Status Unknown     OK TO TRANSFUSE OK TO TRANSFUSE    Crossmatch Result Unknown     Compatible Compatible    Order Confirmation Unknown     ORDER PROCESSED B...     Fecal Occult Blood, POC Latest Ref Range: NEGATIVE         NEGATIVE

## 2014-08-24 NOTE — Telephone Encounter (Signed)
Marissa Figueroa, sister, would like to speak with someone about Marissa Figueroa. She has not been able to get with anyone and would appricate a return call at [phone] [phone].

## 2014-08-25 ENCOUNTER — Encounter (HOSPITAL_COMMUNITY)
Admission: AD | Admit: 2014-08-25 | Discharge: 2014-08-25 | Disposition: A | Discharge status: Home / Self Care | Payer: Medicare Other | Source: Skilled Nursing Facility | Attending: Internal Medicine | Admitting: Internal Medicine

## 2014-08-25 LAB — CBC
HCT: 28 % — ABNORMAL LOW (ref 36.0–46.0)
HEMOGLOBIN: 8.8 g/dL — AB (ref 12.0–15.0)
MCH: 33.7 pg (ref 26.0–34.0)
MCHC: 31.4 g/dL (ref 30.0–36.0)
MCV: 107.3 fL — ABNORMAL HIGH (ref 78.0–100.0)
PLATELETS: 34 10*3/uL — AB (ref 150–400)
RBC: 2.61 MIL/uL — ABNORMAL LOW (ref 3.87–5.11)
RDW: 25.6 % — AB (ref 11.5–15.5)
WBC: 7.9 10*3/uL (ref 4.0–10.5)

## 2014-08-25 LAB — COMPREHENSIVE METABOLIC PANEL
ALBUMIN: 2.1 g/dL — AB (ref 3.5–5.0)
ALK PHOS: 107 U/L (ref 38–126)
ALT: 43 U/L (ref 14–54)
ANION GAP: 9 (ref 5–15)
AST: 65 U/L — ABNORMAL HIGH (ref 15–41)
BUN: 12 mg/dL (ref 6–20)
CALCIUM: 8.8 mg/dL — AB (ref 8.9–10.3)
CO2: 29 mmol/L (ref 22–32)
Chloride: 101 mmol/L (ref 101–111)
Creatinine, Ser: 0.77 mg/dL (ref 0.44–1.00)
GFR calc non Af Amer: >60 mL/min (ref >60)
Glucose, Bld: 223 mg/dL — ABNORMAL HIGH (ref 65–99)
Potassium: 2.9 mmol/L — ABNORMAL LOW (ref 3.5–5.1)
Sodium: 139 mmol/L (ref 135–145)
TOTAL PROTEIN: 5.5 g/dL — AB (ref 6.5–8.1)
Total Bilirubin: 13.8 mg/dL — ABNORMAL HIGH (ref 0.3–1.2)

## 2014-08-25 NOTE — Telephone Encounter (Signed)
I spoke with Olegario MessierKathy. No information was given to her.

## 2014-08-25 NOTE — Progress Notes (Addendum)
Patient ID: Marissa Figueroa, female   DOB: 1955-07-14, 59 y.o.   MRN: [id number]                PROGRESS NOTE  DATE:  08/22/2014            FACILITY: [facility]                            LEVEL OF CARE:   SNF   Acute Visit                CHIEF COMPLAINT:  Transfusion level anemia.      HISTORY OF PRESENT ILLNESS:  This is a patient who was admitted to hospital with an upper GI bleed, supposedly hematemesis.  She also had dark stools.  I do not know that this was guaiaced.    On 08/17/2014, her hemoglobin was 9.3.  On 08/18/2014, hemoglobin was down to 8.5.  On arrival here on 08/20/2014, hemoglobin was 7.  Today, it is 6.2.  She is also thrombocytopenic, although she is above 20,000.  She is auto-anticoagulated.    She tells me that she is having loose stools, although she is on lactulose.  There has been no clear rectal bleeding.  She has not had any further hematemesis.    PHYSICAL EXAMINATION:   GENERAL APPEARANCE:  The patient appears restless, somewhat slurred speech, although no asterixis.  I continue to think she is encephalopathic.   CHEST/RESPIRATORY:  Clear air entry bilaterally.    CARDIOVASCULAR:   CARDIAC:  She appears to be euvolemic.      GASTROINTESTINAL:   ABDOMEN:  Nontender.   LIVER/SPLEEN/KIDNEYS:  No liver, no spleen.  No asterixis.   RECTAL:  Examination revealed firm, OB-negative stool.   MUSCULOSKELETAL:   EXTREMITIES:   RIGHT UPPER EXTREMITY:  Right arm:  There is a considerable amount of old bleeding in this arm, although her hand appears to be well perfused.  Radial pulse is normal.  I wonder if this has something to do with the source of this hemoglobin loss.    ASSESSMENT/PLAN:                                    Transfusional level of anemia.  She did have GI loss in the hospital.  Endoscopy did not show a clear source.  None of the usual complications of variceal bleeding, etc.   She did have what was felt to be portal gastropathy.   She is going to need to be transfused.  I wonder if some of this hemoglobin loss is in her right arm, which is swollen and tender in the elbow.  I have arranged 2 U of packed cells.  Her abdomen is normal, no evidence of another source of blood losss  Thrombocytopenia.  Presumably secondary to portal hypertension.  I do not see a reason to give her platelets at this point.    Swelling of her right arm.  This looks worse than when I saw this yesterday, although the bruising looks old.  I wonder whether this has something to do with her hemoglobin loss.  The hand does not look threatened.     CPT CODE: [id number]       ADDENDUM:   I will repeat an x-ray of her elbow, which seems to be the most reproducible tender area as well  as her distal humerus.  We can do this tomorrow, however.

## 2014-08-25 NOTE — Telephone Encounter (Signed)
Marissa Figueroa doesn't have a DPR on file. The is no one listed for release of information to.

## 2014-08-28 ENCOUNTER — Encounter (HOSPITAL_COMMUNITY)
Admission: RE | Admit: 2014-08-28 | Discharge: 2014-08-28 | Disposition: A | Discharge status: Home / Self Care | Payer: Medicare Other | Attending: Internal Medicine | Admitting: Internal Medicine

## 2014-08-28 LAB — OCCULT BLOOD X 1 CARD TO LAB, STOOL: Fecal Occult Bld: NEGATIVE

## 2014-08-29 ENCOUNTER — Telehealth (INDEPENDENT_AMBULATORY_CARE_PROVIDER_SITE_OTHER): Payer: Self-pay | Admitting: *Deleted

## 2014-08-29 ENCOUNTER — Encounter (HOSPITAL_COMMUNITY)
Admission: RE | Admit: 2014-08-29 | Discharge: 2014-08-29 | Disposition: A | Discharge status: Home / Self Care | Payer: Medicare Other | Source: Skilled Nursing Facility | Attending: Internal Medicine | Admitting: Internal Medicine

## 2014-08-29 ENCOUNTER — Non-Acute Institutional Stay (SKILLED_NURSING_FACILITY): Payer: Medicare Other | Admitting: Internal Medicine

## 2014-08-29 DIAGNOSIS — M25521 Pain in right elbow: Secondary | ICD-10-CM

## 2014-08-29 DIAGNOSIS — K7682 Hepatic encephalopathy: Secondary | ICD-10-CM

## 2014-08-29 DIAGNOSIS — E876 Hypokalemia: Secondary | ICD-10-CM | POA: Diagnosis not present

## 2014-08-29 DIAGNOSIS — M79621 Pain in right upper arm: Secondary | ICD-10-CM | POA: Diagnosis not present

## 2014-08-29 DIAGNOSIS — K7290 Hepatic failure, unspecified without coma: Secondary | ICD-10-CM

## 2014-08-29 DIAGNOSIS — K7031 Alcoholic cirrhosis of liver with ascites: Secondary | ICD-10-CM | POA: Diagnosis not present

## 2014-08-29 LAB — BASIC METABOLIC PANEL
Anion gap: 9 (ref 5–15)
BUN: 9 mg/dL (ref 6–20)
CALCIUM: 8.9 mg/dL (ref 8.9–10.3)
CO2: 24 mmol/L (ref 22–32)
CREATININE: 0.4 mg/dL — AB (ref 0.44–1.00)
Chloride: 100 mmol/L — ABNORMAL LOW (ref 101–111)
GFR calc Af Amer: >60 mL/min (ref >60)
GLUCOSE: 222 mg/dL — AB (ref 65–99)
Potassium: 3.8 mmol/L (ref 3.5–5.1)
SODIUM: 133 mmol/L — AB (ref 135–145)

## 2014-08-29 LAB — CBC
HEMATOCRIT: 33.9 % — AB (ref 36.0–46.0)
Hemoglobin: 10.7 g/dL — ABNORMAL LOW (ref 12.0–15.0)
MCH: 33.9 pg (ref 26.0–34.0)
MCHC: 31.6 g/dL (ref 30.0–36.0)
MCV: 107.3 fL — AB (ref 78.0–100.0)
Platelets: 50 10*3/uL — ABNORMAL LOW (ref 150–400)
RBC: 3.16 MIL/uL — ABNORMAL LOW (ref 3.87–5.11)
RDW: 23.5 % — AB (ref 11.5–15.5)
WBC: 8.7 10*3/uL (ref 4.0–10.5)

## 2014-08-29 NOTE — Telephone Encounter (Signed)
Per Dr.Rehman the patient will need an appointment the end of this month,June, with Terri.

## 2014-08-30 ENCOUNTER — Encounter (INDEPENDENT_AMBULATORY_CARE_PROVIDER_SITE_OTHER): Payer: Self-pay | Admitting: Internal Medicine

## 2014-08-30 ENCOUNTER — Ambulatory Visit (INDEPENDENT_AMBULATORY_CARE_PROVIDER_SITE_OTHER): Payer: Medicare Other | Admitting: Internal Medicine

## 2014-08-30 VITALS — BP 118/70 | HR 84 | Temp 98.4°F | Resp 18 | Ht 68.0 in | Wt 212.1 lb

## 2014-08-30 DIAGNOSIS — K7031 Alcoholic cirrhosis of liver with ascites: Secondary | ICD-10-CM | POA: Diagnosis not present

## 2014-08-30 DIAGNOSIS — K7290 Hepatic failure, unspecified without coma: Secondary | ICD-10-CM | POA: Diagnosis not present

## 2014-08-30 DIAGNOSIS — K7010 Alcoholic hepatitis without ascites: Secondary | ICD-10-CM | POA: Diagnosis not present

## 2014-08-30 DIAGNOSIS — D6489 Other specified anemias: Secondary | ICD-10-CM

## 2014-08-30 DIAGNOSIS — K7682 Hepatic encephalopathy: Secondary | ICD-10-CM

## 2014-08-30 NOTE — Progress Notes (Signed)
Presenting complaint;  Follow-up for decompensated liver disease.  Database;  Patient is 59 year old Caucasian female who has history of alcoholic cirrhosis which was diagnosed about 7 years ago complicated by ascites. She was doing well until she decided to go back to her old ways of drinking too much alcohol. She was admitted with history of hematemesis she was noted to be anemic which she was also jaundiced and had coagulopathy. She underwent EGD after transfusion with fresh frozen plasma. She was noted to have portal gastropathy. EGD was performed one week after upper GI bleed and was felt she had Mallory-Weiss tear. Hospitalization was also pertinent for alcohol withdrawal and hepatic encephalopathy. She was discharged on 08/18/2014 and transferred to [facility].  Subjective;  Patient states she fell twice at the nursing facility. She developed large ecchymosis involving right arm and forearm. Her hemoglobin dropped and she received 2 units of PRBCs. She is getting physical therapy and still unsteady. She states her appetite has improved. She denies nausea vomiting abdominal pain melena or rectal bleeding. She is having 2-5 bowel movements per day. Her daughters tell me that her confusion has improved but not resolved.   Current Medications: Outpatient Encounter Prescriptions as of 08/30/2014  Medication Sig  . B Complex-Biotin-FA (B-COMPLEX PO) Take 1 tablet by mouth daily.  . Calcium Carbonate-Vitamin D (CALCIUM 600+D) 600-400 MG-UNIT per tablet Take 1 tablet by mouth 2 (two) times daily.  . folic acid (FOLVITE) 1 MG tablet Take 1 tablet (1 mg total) by mouth daily.  [name] Kitchen. lactulose (CHRONULAC) 10 GM/15ML solution Take 15 mLs (10 g total) by mouth 2 (two) times daily.  . Multiple Vitamin (MULTIVITAMIN WITH MINERALS) TABS tablet Take 1 tablet by mouth daily.  . pantoprazole (PROTONIX) 40 MG tablet Take 1 tablet (40 mg total) by mouth 2 (two) times daily before a meal.  . predniSONE  (DELTASONE) 10 MG tablet Take 3 tablets (30 mg total) by mouth daily with breakfast.  . thiamine 100 MG tablet Take 1 tablet (100 mg total) by mouth daily.  . furosemide (LASIX) 40 MG tablet Take 1 tablet (40 mg total) by mouth daily. (Patient not taking: Reported on 08/30/2014)  . LORazepam (ATIVAN) 0.5 MG tablet Take 1 tablet (0.5 mg total) by mouth every 8 (eight) hours. (Patient not taking: Reported on 08/30/2014)  . spironolactone (ALDACTONE) 25 MG tablet Take 50 mg by mouth daily.   No facility-administered encounter medications on file as of 08/30/2014.     Objective: Blood pressure 118/70, pulse 84, temperature 98.4 F (36.9 C), temperature source Oral, resp. rate 18, height 5\' 8"  (1.727 m), weight 212 lb 1.6 oz (96.208 kg). Patient is alert and in no acute distress. She had had time telling me her daughter's birthdates Asterixis absent. Conjunctiva is pink. Sclera is icteric Oropharyngeal mucosa is normal. No neck masses or thyromegaly noted. Cardiac exam with regular rhythm normal S1 and S2. No murmur or gallop noted. Lungs are clear to auscultation. Abdomen is full but soft and nontender.  She has 1-2+ pitting involving both legs. She has large ecchymosis involving right arm and forearm.  Labs/studies Results:   Recent Labs  08/29/14 0630  WBC 8.7  HGB 10.7*  HCT 33.9*  PLT 50*    BMET   Recent Labs  08/29/14 0630  NA 133*  K 3.8  CL 100*  CO2 24  GLUCOSE 222*  BUN 9  CREATININE 0.40*  CALCIUM 8.9     Hemoccult occult on 08/28/2014 was negative.  LFTs from 08/25/2014 Total bilirubin 13.8, AP 107, AST 65, ALT 43 and albumin 2.1.   Assessment:  #1. Decompensated alcoholic liver disease. She has ongoing alcoholic hepatitis with cholestasis and top of alcoholic cirrhosis. Large ecchymosis may also be contributing to his cholestasis. Other sequelae include hepatic encephalopathy coagulopathy mild ascites and thrombocytopenia. She is not out of the woods  yet. Condition discussed at length with patient's daughter [name] and Marcelino DusterMichelle. #2. Anemia appears to be multifactorial. She had EGD during recent hospitalization and noted to have portal gastropathy. Upper GI bleed prior to hospitalization was felt to be due to Mallory-Weiss tear. Recent drop in H&H requiring transfusion would appear to be due to large ecchymosis involving right arm and forearm resulting from fall.    Plan:  Patient will continue prednisone taper per schedule. Last dose would be on 09/25/2014. She will have CBC, LFTs INR and serum ammonia on 09/06/2014.

## 2014-08-30 NOTE — Patient Instructions (Signed)
Next blood work is on 09/06/2014.

## 2014-08-31 ENCOUNTER — Non-Acute Institutional Stay (SKILLED_NURSING_FACILITY): Payer: Medicare Other | Admitting: Internal Medicine

## 2014-08-31 ENCOUNTER — Encounter (HOSPITAL_COMMUNITY)
Admission: AD | Admit: 2014-08-31 | Discharge: 2014-08-31 | Disposition: A | Discharge status: Home / Self Care | Payer: Medicare Other | Source: Skilled Nursing Facility | Attending: Internal Medicine | Admitting: Internal Medicine

## 2014-08-31 DIAGNOSIS — M79621 Pain in right upper arm: Secondary | ICD-10-CM

## 2014-08-31 DIAGNOSIS — K7290 Hepatic failure, unspecified without coma: Secondary | ICD-10-CM

## 2014-08-31 DIAGNOSIS — M25521 Pain in right elbow: Secondary | ICD-10-CM

## 2014-08-31 DIAGNOSIS — K7682 Hepatic encephalopathy: Secondary | ICD-10-CM

## 2014-08-31 DIAGNOSIS — K7031 Alcoholic cirrhosis of liver with ascites: Secondary | ICD-10-CM

## 2014-08-31 LAB — COMPREHENSIVE METABOLIC PANEL
ALBUMIN: 1.9 g/dL — AB (ref 3.5–5.0)
ALK PHOS: 137 U/L — AB (ref 38–126)
ALT: 44 U/L (ref 14–54)
AST: 46 U/L — ABNORMAL HIGH (ref 15–41)
Anion gap: 6 (ref 5–15)
BUN: 10 mg/dL (ref 6–20)
CALCIUM: 8.4 mg/dL — AB (ref 8.9–10.3)
CO2: 26 mmol/L (ref 22–32)
Chloride: 101 mmol/L (ref 101–111)
Creatinine, Ser: 0.52 mg/dL (ref 0.44–1.00)
GFR calc Af Amer: >60 mL/min (ref >60)
GFR calc non Af Amer: >60 mL/min (ref >60)
GLUCOSE: 145 mg/dL — AB (ref 65–99)
POTASSIUM: 3.9 mmol/L (ref 3.5–5.1)
Sodium: 133 mmol/L — ABNORMAL LOW (ref 135–145)
Total Bilirubin: 10.3 mg/dL — ABNORMAL HIGH (ref 0.3–1.2)
Total Protein: 5.4 g/dL — ABNORMAL LOW (ref 6.5–8.1)

## 2014-09-02 NOTE — Telephone Encounter (Signed)
Patient was seen by Dr. Karilyn Cotaehman on 08/30/14.

## 2014-09-02 NOTE — Progress Notes (Addendum)
Patient ID: Marissa NicksChristine L Inman, female   DOB: Jun 05, 1955, 59 y.o.   MRN: [id number]                PROGRESS NOTE  DATE:  08/29/2014         FACILITY: [facility]                     LEVEL OF CARE:   SNF   Acute Visit                    CHIEF COMPLAINT:  Follow up decompensated cirrhosis.     HISTORY OF PRESENT ILLNESS:  This is a patient whom we received after being admitted to hospital with an upper GI bleed.  She has a history of cirrhosis of the liver.  She had an upper GI endoscopy that showed portal gastropathy.  She came to us on lactulose 15 cc b.i.d.    Notable on arrival here for continued mild to moderate encephalopathy.    Last week, I gave her hourly lactulose and then boosted her to 45 mL t.i.d.   Yesterday, she told me she had three soft bowel movements.  She has made a remarkable improvement and she is now alert and coherent.    Also after her arrival here, her hemoglobin plummeted from 9.3 on 08/17/2014 to 6.2.  I transfused her 2 U and her hemoglobin continues to go up, today at 10.7.     Finally, she arrived here with a severe right arm hematoma from just above the elbow down to her wrist.  There is tense edema here.   Nevertheless, she has maintained perfusion to her hand.  Her radial pulse is good.  The arm is tender, but I do not think this represents a threatening compartment syndrome.    LABORATORY DATA:   Lab work from today shows:    Sodium 133, potassium 3.8, CO2 24, BUN 9, creatinine 0.4.    Hemoglobin 10.7 which has continued to improve, white count 8.7, platelet count 50,000.    REVIEW OF SYSTEMS:    CHEST/RESPIRATORY:  The patient is not complaining of shortness of breath.    CARDIAC:  No chest pain.    GI:  No abdominal pain.    As noted, 2-3 loose bowel movements yesterday, which is satisfactory.   MUSCULOSKELETAL:   She is still complaining of a lot of pain and difficulty moving the right arm.    PHYSICAL EXAMINATION:   GENERAL  APPEARANCE:  The patient looks considerably better.  Alert, conversational.    A lot better than last week.     HEENT:     EYES:  She still has significant scleral icterus.   CHEST/RESPIRATORY:  Clear air entry bilaterally.    CARDIOVASCULAR:   CARDIAC:  Heart sounds are normal.   GASTROINTESTINAL:   LIVER/SPLEEN/KIDNEYS:  No liver, no spleen.  No tenderness.    No asterixis.   MUSCULOSKELETAL:   EXTREMITIES:   RIGHT UPPER EXTREMITY:  Right arm:  There is very marked bruising, tense bruising in the right arm.  She came with this.  Her radial pulse is intact.  She is able to flex and extend her fingers, also her wrist, but there is some difficulty in flexion.  She has evidence of old blood loss dating back probably at least a week.     ASSESSMENT/PLAN:  Hepatic encephalopathy.   This is a lot better.  Currently on lactulose 45 mL three times daily.      Hypokalemia.  Her potassium today is 3.8.  I am going to continue the potassium at 40 b.i.d. to replenish her potassium stores.    Anemia.  Her hemoglobin is up to 10.7.  Some of the blood loss here may have been into the right arm.   Right arm pain and swelling.  A lot of blood loss has occurred into the arm.  There is no evidence of a fracture here.  I do not believe that she has any threatening vascular supply or function in the right arm.  However, the edema will need to eventually resolve.  This will occur slowly.    Thrombocytopenia.  I think this is probably portal hypertension.

## 2014-09-04 ENCOUNTER — Encounter (HOSPITAL_COMMUNITY)
Admission: RE | Admit: 2014-09-04 | Discharge: 2014-09-04 | Disposition: A | Discharge status: Home / Self Care | Payer: Medicare Other | Attending: Internal Medicine | Admitting: Internal Medicine

## 2014-09-04 LAB — OCCULT BLOOD X 1 CARD TO LAB, STOOL: Fecal Occult Bld: NEGATIVE

## 2014-09-05 ENCOUNTER — Non-Acute Institutional Stay (SKILLED_NURSING_FACILITY): Payer: Medicare Other | Admitting: Internal Medicine

## 2014-09-05 ENCOUNTER — Encounter (HOSPITAL_COMMUNITY)
Admission: RE | Admit: 2014-09-05 | Discharge: 2014-09-05 | Disposition: A | Discharge status: Home / Self Care | Payer: Medicare Other | Source: Skilled Nursing Facility | Attending: Internal Medicine | Admitting: Internal Medicine

## 2014-09-05 DIAGNOSIS — M79621 Pain in right upper arm: Secondary | ICD-10-CM | POA: Diagnosis not present

## 2014-09-05 DIAGNOSIS — E876 Hypokalemia: Secondary | ICD-10-CM | POA: Diagnosis not present

## 2014-09-05 DIAGNOSIS — K7682 Hepatic encephalopathy: Secondary | ICD-10-CM

## 2014-09-05 DIAGNOSIS — M25521 Pain in right elbow: Secondary | ICD-10-CM

## 2014-09-05 DIAGNOSIS — K7290 Hepatic failure, unspecified without coma: Secondary | ICD-10-CM

## 2014-09-05 DIAGNOSIS — K7031 Alcoholic cirrhosis of liver with ascites: Secondary | ICD-10-CM

## 2014-09-05 LAB — COMPREHENSIVE METABOLIC PANEL
ALT: 49 U/L (ref 14–54)
ANION GAP: 3 — AB (ref 5–15)
AST: 54 U/L — ABNORMAL HIGH (ref 15–41)
Albumin: 1.7 g/dL — ABNORMAL LOW (ref 3.5–5.0)
Alkaline Phosphatase: 151 U/L — ABNORMAL HIGH (ref 38–126)
BUN: 12 mg/dL (ref 6–20)
CALCIUM: 8.2 mg/dL — AB (ref 8.9–10.3)
CO2: 25 mmol/L (ref 22–32)
Chloride: 104 mmol/L (ref 101–111)
Creatinine, Ser: 0.56 mg/dL (ref 0.44–1.00)
GFR calc Af Amer: >60 mL/min (ref >60)
GFR calc non Af Amer: >60 mL/min (ref >60)
Glucose, Bld: 158 mg/dL — ABNORMAL HIGH (ref 65–99)
POTASSIUM: 4.4 mmol/L (ref 3.5–5.1)
SODIUM: 132 mmol/L — AB (ref 135–145)
Total Bilirubin: 8.3 mg/dL — ABNORMAL HIGH (ref 0.3–1.2)
Total Protein: 5 g/dL — ABNORMAL LOW (ref 6.5–8.1)

## 2014-09-05 LAB — CBC WITH DIFFERENTIAL/PLATELET
BASOS ABS: 0 10*3/uL (ref 0.0–0.1)
BASOS PCT: 0 % (ref 0–1)
EOS ABS: 0.3 10*3/uL (ref 0.0–0.7)
Eosinophils Relative: 5 % (ref 0–5)
HEMATOCRIT: 29.5 % — AB (ref 36.0–46.0)
HEMOGLOBIN: 9.5 g/dL — AB (ref 12.0–15.0)
Lymphocytes Relative: 33 % (ref 12–46)
Lymphs Abs: 1.7 10*3/uL (ref 0.7–4.0)
MCH: 34.7 pg — ABNORMAL HIGH (ref 26.0–34.0)
MCHC: 32.2 g/dL (ref 30.0–36.0)
MCV: 107.7 fL — AB (ref 78.0–100.0)
Monocytes Absolute: 0.4 10*3/uL (ref 0.1–1.0)
Monocytes Relative: 8 % (ref 3–12)
NEUTROS PCT: 54 % (ref 43–77)
Neutro Abs: 2.7 10*3/uL (ref 1.7–7.7)
Platelets: 45 10*3/uL — ABNORMAL LOW (ref 150–400)
RBC: 2.74 MIL/uL — ABNORMAL LOW (ref 3.87–5.11)
RDW: 22.1 % — ABNORMAL HIGH (ref 11.5–15.5)
WBC: 5.1 10*3/uL (ref 4.0–10.5)

## 2014-09-05 LAB — PROTIME-INR
INR: 2.86 — ABNORMAL HIGH (ref 0.00–1.49)
PROTHROMBIN TIME: 29.5 s — AB (ref 11.6–15.2)

## 2014-09-05 LAB — AMMONIA: Ammonia: 70 umol/L — ABNORMAL HIGH (ref 9–35)

## 2014-09-06 NOTE — Progress Notes (Addendum)
Patient ID: Marissa Figueroa, female   DOB: 1955-11-12, 59 y.o.   MRN: [id number]                PROGRESS NOTE  DATE:  08/31/2014          FACILITY: [facility]                  LEVEL OF CARE:   SNF   Acute Visit                  CHIEF COMPLAINT:  Follow up hepatic encephalopathy, decompensated alcoholic cirrhosis with acute alcoholic hepatitis.      HISTORY OF PRESENT ILLNESS:  This is a patient who presented to hospital with hematemesis.  Her EGD showed portal gastropathy, ?Mallory-Weiss tear although none was visualized.  There were no varices.    Shortly after her arrival here, she became encephalopathic.  She required copious amounts of lactulose too stabilize this.  She is currently on lactulose 45 cc three times a day although she has not had a bowel movement in two days, both per the patient and the staff.     She has made quite a turn-around, however, in terms of her mentation. She is a lot better than last week.     Aside from the fact that there has been no BM, I have managed to verify with the staff that she appears to be doing a lot better.  She is much more coherent and conversational.       LABORATORY DATA:   Lab work today shows an improvement in her sodium to 139.  Her BUN is 10, creatinine 0.52.    Her albumin is 1.9.    Alk phos is 137, AST 46, ALT 44.  All of this is stable.    Her total bilirubin is down to 10.3 from 13.8 on 08/25/2014.   However, it was 8.3 on 08/20/2014.  She is still very jaundiced.    PHYSICAL EXAMINATION:   GENERAL APPEARANCE:  The patient is not in any distress.   HEENT:   EYES:    Marked icterus.   CHEST/RESPIRATORY:  Clear air entry bilaterally.    CARDIOVASCULAR:   CARDIAC:  She appears to be euvolemic.      GASTROINTESTINAL:   ABDOMEN:  Nontender.  No masses.    LIVER/SPLEEN/KIDNEYS:  No liver, no spleen.  There is no asterixis.   MUSCULOSKELETAL:   EXTREMITIES:   RIGHT UPPER EXTREMITY:  Right arm:  This is  much more functional.  I think she had a bleed into the arm in the hospital.  She is auto-anticoagulated.  I did give her some vitamin K.  At some point, this is going to need to be repeated.  The arm was concerning to me on admission.  I was worried about a compartment syndrome, although I do not think this materialized.  Her arm is functional and a lot less painful.  She never had vascular compromise.    ASSESSMENT/PLAN:                   Decompensated alcoholic cirrhosis with acute alcoholic hepatitis.  Her bilirubin today is 10.3.  This is up from the hospital, but down from last week.  She continues to be clear in her thought process, although she is not responding well to the lactulose, at least in terms of bowel movements.  She remains on prednisone.  Her last dose is listed as being  on 09/25/2014.    Hepatic encephalopathy.  I think this is considerably improved, although she may have regressed a bit in the last two days.  I am wondering about Xifaxan.  For now, I am going to increase the lactulose to 60 mL three times a day.    I agree with Dr. Olevia Perches statement that she is certainly not out of the woods yet, although she is a lot better.    I am much less concerned about the right arm than I was when she first came into the building.

## 2014-09-07 ENCOUNTER — Encounter (HOSPITAL_COMMUNITY)
Admission: RE | Admit: 2014-09-07 | Discharge: 2014-09-07 | Disposition: A | Discharge status: Home / Self Care | Payer: Medicare Other | Source: Skilled Nursing Facility | Attending: Internal Medicine | Admitting: Internal Medicine

## 2014-09-07 DIAGNOSIS — R195 Other fecal abnormalities: Secondary | ICD-10-CM | POA: Diagnosis present

## 2014-09-07 LAB — OCCULT BLOOD X 1 CARD TO LAB, STOOL
FECAL OCCULT BLD: NEGATIVE
Fecal Occult Bld: NEGATIVE

## 2014-09-08 ENCOUNTER — Encounter (HOSPITAL_COMMUNITY)
Admission: RE | Admit: 2014-09-08 | Discharge: 2014-09-08 | Disposition: A | Discharge status: Home / Self Care | Payer: Medicare Other | Source: Skilled Nursing Facility | Attending: Internal Medicine | Admitting: Internal Medicine

## 2014-09-08 LAB — BASIC METABOLIC PANEL
Anion gap: 3 — ABNORMAL LOW (ref 5–15)
BUN: 12 mg/dL (ref 6–20)
CALCIUM: 8.4 mg/dL — AB (ref 8.9–10.3)
CO2: 29 mmol/L (ref 22–32)
Chloride: 103 mmol/L (ref 101–111)
Creatinine, Ser: 0.62 mg/dL (ref 0.44–1.00)
GFR calc Af Amer: >60 mL/min (ref >60)
GFR calc non Af Amer: >60 mL/min (ref >60)
Glucose, Bld: 104 mg/dL — ABNORMAL HIGH (ref 65–99)
Potassium: 4.7 mmol/L (ref 3.5–5.1)
SODIUM: 135 mmol/L (ref 135–145)

## 2014-09-09 NOTE — Progress Notes (Addendum)
Patient ID: Marissa Figueroa, female   DOB: Jan 21, 1956, 59 y.o.   MRN: [id number]                PROGRESS NOTE  DATE:  09/05/2014          FACILITY: [facility]                           LEVEL OF CARE:   SNF   Acute Visit                      CHIEF COMPLAINT:  Follow up cirrhosis.      HISTORY OF PRESENT ILLNESS:  This is a patient who came to [place] after an upper GI bleed.  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  She had an upper GI endoscopy that showed portal gastropathy.    We have had to transfuse her since her arrival here.  I think a lot of this might have been blood into her arm on the right.  In any case, that stabilized.    Unfortunately, when she arrived here, she was encephalopathic.  I had to go up to 60 mL t.i.d. of lactulose.  She had four bowel movements yesterday and has already had three today.  This can probably be reduced.    The other concerning thing was the amount of blood that she had in her arm.  However, this was not acute as the bruising was old.  It is resorbing and her range of motion in her arm is better.    LABORATORY DATA:   Lab work from today shows a sodium of 132, potassium 4.4, BUN 12, creatinine 0.56.    Her albumin is 1.7.    AST is 54, ALT 49, alk phos 151.  Her bilirubin total is down to 8.3.    INR is 2.86, which indicates auto-anticoagulation.    Her white count is 5.1, hemoglobin 9.5, platelet count 45,000.  Differential count is normal.    REVIEW OF SYSTEMS:    CHEST/RESPIRATORY:  No shortness of breath.   CARDIAC:  No chest pain.     GI:  Three liquid bowel movements today.  She is probably the most accurate source of this.   MUSCULOSKELETAL:  The right arm continues to improve on physical exam range and pain levels.      PHYSICAL EXAMINATION:   CHEST/RESPIRATORY:  Clear.     CARDIOVASCULAR:   CARDIAC:  Heart sounds are normal.   GASTROINTESTINAL:   LIVER/SPLEEN/KIDNEYS:  No liver, no  spleen.  No tenderness.    No asterixis.  She is still jaundiced.      ASSESSMENT/PLAN:                   Hepatic encephalopathy.  I think we can cut down on the lactulose to 40 mL t.i.d. from 60.    Hypokalemia.  This has been corrected at 4.4 today.  I will recheck this and reduce the dose of K   Anemia.  Status post transfusion.  Her hemoglobin is 9.5.  There is fluctuation here, but this is generally stable.    Right arm pain and swelling.  I was concerned about this when she first came in, although this seems to have stabilized.

## 2014-09-10 ENCOUNTER — Non-Acute Institutional Stay (SKILLED_NURSING_FACILITY): Payer: Medicare Other | Admitting: Internal Medicine

## 2014-09-10 ENCOUNTER — Encounter: Payer: Self-pay | Admitting: Internal Medicine

## 2014-09-10 DIAGNOSIS — K7469 Other cirrhosis of liver: Secondary | ICD-10-CM | POA: Diagnosis not present

## 2014-09-10 DIAGNOSIS — R609 Edema, unspecified: Secondary | ICD-10-CM

## 2014-09-10 NOTE — Progress Notes (Signed)
Patient ID: Marissa Figueroa, female   DOB: 11-07-1955, 59 y.o.   MRN: [id number]       This is an acute visit.  Level care skilled.  Facility [facility].  Chief complaint-acute visit secondary to weight gain with history of cirrhosis  History of present illness.  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  She was admitted to the hospital with bloody emesis-she had evidence of portal gastropathy but no varices ulcerations or other abnormalities.  Was thought she may have had a Mallory-Weiss tear that healed over.  She is on a proton pump inhibitor.   She was noted to have decompensated alcoholic cirrhosi-she is completing a prednisone taper  She also received albumin secondary to severe hypobilirubinemia-and also Lasix.  As well as a platelet transfusion.  She also completed treatment for UTI.   Earlier in her stay her hemoglobin did dip 6.2 she required a transfusion-however this appears to have stabilized hemoglobin actually was 9.5 on May 23.  Clinically she appears to have had a significant improvement as well-she has required lactulose secondary to her cirrhosis-but this appears to have stabilized and her lactulose dose recently was decreased.  Her mental status has improved significantly she is bright alert today-.  He was noted by nursing today she appears to have gained some weight about 4 pounds in the past day and appears previous weights have been around 229 for several days however today is 233.  She feels she does have some increased edema.  Appears her admission weight actually was 248.--Although not totally sure of the accuracy of this  She is on Lasix 40 mg a day as well as potassium 20 mEq a day.--Appears her Lasix was restarted on May 23-it had been held secondary to apparently a profound weight loss earlier in her stay Aldactone also has been DC'd previously   She does not complain  of any increased shortness of breath or fatigue again she is doing much better in this regards.    .  Family medical social history as been reviewed per admission note on 08/21/2014.  Medications have been reviewed per MAR.  Review of systems.  Gen. she is not complaining of any fever or chills  Skin does not complain of rashes or itching.  Head ears eyes nose mouth and throat does not complain of any dysphagia or sore throat.  Respiratory is not complaining of shortness breath or cough.  Cardiac no chest pain.  GI he is not complaining of abdominal discomfort -she is having regular bowel movements  Musculoskeletal  Does not really complain of joint pain today.  Neurologic does not complain of dizziness or headache.  Psych-does not complain of depression and anxiety she is much more alert than when I saw her last time  Physical exam.   Temperature 97.9 pulse 93 respirations 20 blood pressure 138/64-weight is 233.  In general this is a  middle-aged lady who looks looks significantly improved from when I last saw her she is more bright alert talkative  Skin is warm and dry-she still continues with generalized jaundiced appearance  Oropharynx is clear mucous membranes moist.  Eye--icterus sclera--acuity appears intact  Chest is clear to auscultation with somewhat shallow air entry no labored breathing.  Heart is regular rate and rhythm without murmur gallop or rub she has  lower extremity edema.  Abdomen-c it is soft nontender (possibly she is putting on some more fluid  there  are positive bowel sounds.  Musculoskeletal  Was all extremities 4 limited exam since patient is resting in bed she is ambulatory in the wheelchair and has been doing quite well.  Neurologic-appears able to move all her extremities at baseline.  Psych  She is alert and oriented pleasant and conversant  Labs.  09/05/2014.  Sodium 132 potassium 4.4 BUN 12 creatinine 0.56.  Human 1.7  AST 54 ALT 49 alk phosphatase 151 bilirubin 8.3.  WBC 5.1 hemoglobin 9.5 platelets 45,000  08/23/2014.  WBC 6.9 hemoglobin 9.5 platelets 35,000.  Sodium 138 potassium 3.2 BUN 14 creatinine 0.71.  Yesterday her hemoglobin was 6.2.  Assessment and plan.  #1 history of hepatic encephalopathy-with edema-she appears to have had possibly some increased edema here-per patient's report and scale readings-Will start low-dose Aldactone 25 mg a day in addition to the Lasix-we will have to monitor her renal function and electrolytes closely update a metabolic panel on Monday and also in one week to ensure stability-clinically she appears to be significantly improved which is encouraging but still a fragile individual.    #2 anemia-hemoglobin appears to have stabilized status post transfusion clinically again she appears to be stronger feeling better.--CBC has been ordered for next week  [id number]-of note greater than 30 minutes spent assessing patient discussing her status at bedside-extensive review of her chart-and coordinating and formulating a plan of care-of note greater than 50% of time spent coordinating plan of care       .

## 2014-09-12 ENCOUNTER — Encounter (HOSPITAL_COMMUNITY)
Admission: RE | Admit: 2014-09-12 | Discharge: 2014-09-12 | Disposition: A | Discharge status: Home / Self Care | Payer: Medicare Other | Source: Skilled Nursing Facility | Attending: Internal Medicine | Admitting: Internal Medicine

## 2014-09-12 ENCOUNTER — Non-Acute Institutional Stay (SKILLED_NURSING_FACILITY): Payer: Medicare Other | Admitting: Internal Medicine

## 2014-09-12 DIAGNOSIS — K7031 Alcoholic cirrhosis of liver with ascites: Secondary | ICD-10-CM

## 2014-09-12 DIAGNOSIS — R635 Abnormal weight gain: Secondary | ICD-10-CM | POA: Diagnosis not present

## 2014-09-12 LAB — CBC
HEMATOCRIT: 36.8 % (ref 36.0–46.0)
Hemoglobin: 11.8 g/dL — ABNORMAL LOW (ref 12.0–15.0)
MCH: 34.7 pg — ABNORMAL HIGH (ref 26.0–34.0)
MCHC: 32.1 g/dL (ref 30.0–36.0)
MCV: 108.2 fL — AB (ref 78.0–100.0)
PLATELETS: 46 10*3/uL — AB (ref 150–400)
RBC: 3.4 MIL/uL — ABNORMAL LOW (ref 3.87–5.11)
RDW: 20.2 % — AB (ref 11.5–15.5)
WBC: 5.8 10*3/uL (ref 4.0–10.5)

## 2014-09-12 LAB — COMPREHENSIVE METABOLIC PANEL
ALK PHOS: 164 U/L — AB (ref 38–126)
ALT: 53 U/L (ref 14–54)
AST: 59 U/L — ABNORMAL HIGH (ref 15–41)
Albumin: 2 g/dL — ABNORMAL LOW (ref 3.5–5.0)
Anion gap: 10 (ref 5–15)
BUN: 14 mg/dL (ref 6–20)
CO2: 23 mmol/L (ref 22–32)
Calcium: 8.6 mg/dL — ABNORMAL LOW (ref 8.9–10.3)
Chloride: 103 mmol/L (ref 101–111)
Creatinine, Ser: 0.66 mg/dL (ref 0.44–1.00)
GFR calc Af Amer: >60 mL/min (ref >60)
GFR calc non Af Amer: >60 mL/min (ref >60)
GLUCOSE: 142 mg/dL — AB (ref 65–99)
POTASSIUM: 4 mmol/L (ref 3.5–5.1)
SODIUM: 136 mmol/L (ref 135–145)
Total Bilirubin: 8.3 mg/dL — ABNORMAL HIGH (ref 0.3–1.2)
Total Protein: 5.8 g/dL — ABNORMAL LOW (ref 6.5–8.1)

## 2014-09-12 MED ORDER — FUROSEMIDE 40 MG PO TABS: 40.0000 mg | ORAL_TABLET | Freq: Every day | ORAL | Status: DC

## 2014-09-12 NOTE — Progress Notes (Signed)
Patient ID: Marissa NicksChristine L Figueroa, female   DOB: 1955/11/04, 59 y.o.   MRN: [id number] Facility; [facility] Chief complaint; weight gain History; as Marissa Figueroa is a lady who has chronic cirrhosis of liver secondary to alcohol. We've had innumerable issues here with her including hepatic encephalopathy [now more stable], refractory jaundice [now with Billy remitted 8.3 which is down but reasonably stable] and more recently increasing weights. I tried to increase her diarrhetic's last week and wrote for her Lasix to be restarted at 40 mg however apparently she was already on 40 mg. She was seen by our service on 5/28 and put on Aldactone at 25 mg.  In terms of her weights it would appear that she was to 228.2 on admission, since then she switched to a bed with away scale. Her first weight there was 209.8 she is steadily increased to 233.4 which is over a 20 pound weight gain. She is noted the increasing edema, feels her abdomen may be more distended but she is not short of breath.  Lab work from today CMP; sodium at 136 potassium at 4 BUN of 14 creatinine of 0.66 albumen at 2 AST 59 ALT 53 alkaline phosphatase 164 total bilirubin at 8.3. The bilirubin is unchanged from one week ago. CBC; white count of 5.8, hemoglobin 11.8, platelets at 46,000  Review of systems; Respiratory no shortness of breath Cardiac no chest pain GI feels her abdomen is distended, 2 bowel movements today for yesterday  Physical examination Gen.; still marked icterus however she appears to be cognitively intact Respiratory clear entry bilaterally Cardiac heart sounds are normal JVP is not elevated. Abdomen; mildly distended however no further shifting dullness albeit with her sitting upright  Extremities; significant edema all the way up into her groin 2-3+ sacral pitting edema.  Impression/plan #1 increasing edema resulting in a very significant weight gain. This is no doubt multifactorial mostly related to  chronic cirrhosis and an albumen of 2. She will clearly need more diarrhetic and I'll increase her today to 80 mg. #2 severe anemia on her admission we had to transfuse her since then her hemoglobin has been stable #3 thrombocytopenia no doubt due to portal hypertension this is stable she has had no bleeding complications. #4 hepatic encephalopathy; this seems to be controlled on lactulose 45 ML's 3 times a day. I am not planning to change this

## 2014-09-14 ENCOUNTER — Encounter (HOSPITAL_COMMUNITY)
Admission: AD | Admit: 2014-09-14 | Discharge: 2014-09-14 | Disposition: A | Discharge status: Home / Self Care | Payer: Medicare Other | Source: Skilled Nursing Facility | Attending: Internal Medicine | Admitting: Internal Medicine

## 2014-09-14 ENCOUNTER — Non-Acute Institutional Stay (SKILLED_NURSING_FACILITY): Payer: Medicare Other | Admitting: Internal Medicine

## 2014-09-14 DIAGNOSIS — R635 Abnormal weight gain: Secondary | ICD-10-CM | POA: Diagnosis not present

## 2014-09-14 DIAGNOSIS — K7031 Alcoholic cirrhosis of liver with ascites: Secondary | ICD-10-CM | POA: Diagnosis not present

## 2014-09-14 DIAGNOSIS — R609 Edema, unspecified: Secondary | ICD-10-CM | POA: Diagnosis not present

## 2014-09-14 LAB — BASIC METABOLIC PANEL
Anion gap: 9 (ref 5–15)
BUN: 14 mg/dL (ref 6–20)
CO2: 28 mmol/L (ref 22–32)
Calcium: 8.3 mg/dL — ABNORMAL LOW (ref 8.9–10.3)
Chloride: 101 mmol/L (ref 101–111)
Creatinine, Ser: 0.71 mg/dL (ref 0.44–1.00)
GFR calc Af Amer: >60 mL/min (ref >60)
GFR calc non Af Amer: >60 mL/min (ref >60)
GLUCOSE: 94 mg/dL (ref 65–99)
Potassium: 3.2 mmol/L — ABNORMAL LOW (ref 3.5–5.1)
SODIUM: 138 mmol/L (ref 135–145)

## 2014-09-15 NOTE — Progress Notes (Signed)
Patient ID: Marissa Figueroa, female   DOB: 1956-01-16, 59 y.o.   MRN: [id number]                PROGRESS NOTE  DATE:  09/14/2014            FACILITY: [facility]                    LEVEL OF CARE:   SNF   Acute Visit                     CHIEF COMPLAINT:  Follow up edema.       HISTORY OF PRESENT ILLNESS:  Marissa Figueroa has been gaining weight precipitously over the last three weeks, putting on over 20 pounds.  I saw her for this two days ago, increased her Lasix to 80 mg a day from 40.  She had already been put on Aldactone 25.       I have re-reviewed her weights again.  On 08/25/2014, she was 205 pounds.   This has gone up to 237 pounds, although over the last three days she has been essentially stable at 237 pounds.  The situation is compromised by an albumin of 2.  Her BUN and creatinine are both within the normal range.     LABORATORY DATA:   Lab work today showed a sodium of 138, potassium 3.2, CO2 28, BUN 14, creatinine 0.71.    REVIEW OF SYSTEMS:    CHEST/RESPIRATORY:  She is not short of breath.   CARDIAC:  No chest pain.    GI:  No abdominal pain, but thinks her abdomen is distending.    PHYSICAL EXAMINATION:   GENERAL APPEARANCE:  The patient is not in any distress.     CHEST/RESPIRATORY:  Exam is clear.          CARDIOVASCULAR:   CARDIAC:  Heart sounds are normal.  Her JVP is not elevated.    GASTROINTESTINAL:   ABDOMEN:   LIVER/SPLEEN/KIDNEYS:  No liver, no spleen.  No asterixis.  I cannot pick up any ascites at the bedside.   It certainly does not mean that this does not exist.    CIRCULATION:   EDEMA/VARICOSITIES:  Extremities:  Massive edema up into her groin.    ASSESSMENT/PLAN:                  Weight gain.  Decompensated edema in the face of advanced cirrhosis.  I have increased her Lasix to 100 q.d. and the Aldactone to 25 b.i.d.       Hypokalemia.  Carefully increasing her K-Dur to 60 b.i.d. from 40 b.i.d.  Basic metabolic panel on Friday,  CMP on Monday.    At this point, she is not uncomfortable and I would not consider an albumin infusion yet.  Changing to Demadex is also another thought.      CPT CODE: [id number]

## 2014-09-16 ENCOUNTER — Encounter (HOSPITAL_COMMUNITY)
Admission: AD | Admit: 2014-09-16 | Discharge: 2014-09-16 | Disposition: A | Discharge status: Home / Self Care | Payer: Medicare Other | Source: Skilled Nursing Facility | Attending: Internal Medicine | Admitting: Internal Medicine

## 2014-09-16 ENCOUNTER — Non-Acute Institutional Stay (SKILLED_NURSING_FACILITY): Payer: Medicare Other | Admitting: Internal Medicine

## 2014-09-16 ENCOUNTER — Encounter: Payer: Self-pay | Admitting: Internal Medicine

## 2014-09-16 DIAGNOSIS — E876 Hypokalemia: Secondary | ICD-10-CM | POA: Diagnosis not present

## 2014-09-16 DIAGNOSIS — K7011 Alcoholic hepatitis with ascites: Secondary | ICD-10-CM

## 2014-09-16 DIAGNOSIS — R635 Abnormal weight gain: Secondary | ICD-10-CM | POA: Diagnosis not present

## 2014-09-16 LAB — BASIC METABOLIC PANEL
ANION GAP: 6 (ref 5–15)
BUN: 14 mg/dL (ref 6–20)
CO2: 28 mmol/L (ref 22–32)
Calcium: 8.3 mg/dL — ABNORMAL LOW (ref 8.9–10.3)
Chloride: 104 mmol/L (ref 101–111)
Creatinine, Ser: 0.8 mg/dL (ref 0.44–1.00)
GFR calc non Af Amer: >60 mL/min (ref >60)
Glucose, Bld: 134 mg/dL — ABNORMAL HIGH (ref 65–99)
Potassium: 3.5 mmol/L (ref 3.5–5.1)
SODIUM: 138 mmol/L (ref 135–145)

## 2014-09-16 NOTE — Progress Notes (Signed)
Patient ID: Marissa Figueroa, female   DOB: 02-22-1956, 59 y.o.   MRN: [id number]  : ,    :  ,     :                     [facility]                    LEVEL OF CARE:   SNF  This is an acute visit   Acute Visit                     CHIEF COMPLAINT:  Follow up edemaweight gain.       HISTORY OF PRESENT ILLNESS:  Marissa Figueroa had been gaining weight precipitously over the last three weeks, putting on over 20 pounds.  Dr. Cleatis Polkaobson-gradually increase her Lasix now 100 mg daily as of 2 days ago-she is also on Aldactone 25 mg a day.  Her weight appears to have stabilized to 38.8 today which is comparable to her weight a couple days ago-she does not complain of any increased shortness of breath wheezing-appears clinically to be stable continues with fairly significant edema      .  The situation is compromised by an albumin of 2.  Her BUN and creatinine are both within the normal range.     LABORATORY DATA:  Lab work today shows a sodium 138 potassium 3.5 BUN 14 creatinine 0.8 medical May 30 sodium was 136 potassium 4 BUN 14 creatinine 0.66-I do note on June 1 her potassium was 3.2 again this is being supplemented and has normalized  Family medical social history reviewed most recently progress note 09/14/2014.  [facility] she is on Aldactone 25 mg a day Lasix 100 mg daily-potassium is been increased to 60 mEq twice a day.    REVIEW OF SYSTEMS Gen. no complaints of fever or chills her weight appears stabilized.  Skin does not complain of rashes or itching.  Head ears eyes nose mouth and throat no complaints of sore throat or visual changes:    CHEST/RESPIRATORY:  She is not short of breath.   CARDIAC:  No chest pain.    GI:  No abdominal pain, but continues to think her abdomen is distending muscle skeletal does not complain of joint pain appears to have gained strength.  Neurologic does not complain of dizziness  headache or syncopal-type feelings.  .      PHYSICAL EXAMINATION: Temperature 97.9 pulse 72 respirations 18 blood pressure 118/64 weight is 238.8    GENERAL APPEARANCE:  The patient is not in any distress.     CHEST/RESPIRATORY:  Exam is clear.No labored breathing          CARDIOVASCULAR:   CARDIAC:  Heart sounds are normal.  Her JVP is not elevated.    GASTROINTESTINAL:   ABDOMEN:    Soft nontender with positive bowel sounds continues to have some distention this appears baseline with previous exams   CIRCULATION:   EDEMA/VARICOSITIES:  Extremities:  edema up into her groin  Social skeletal largely ambulates in a wheelchair does move all extremities at baseline with lower extremity weakness I suspect partially due to her edema.  Neurologic is grossly intact her speech is clear.  Psych she is alert and oriented 3 pleasant and appropriate.    ASSESSMENT/PLAN:                  Weight  gain.  Decompensated edema in the face of advanced cirrhosis.  Currently on Aldactone 25 mg day as well as Lasix 100 mg a day-she also is on aggressive potassium supplementation-her labs appear to be stable potassium is rising-clinically appears to be stable-this was discussed with Dr. Leanord Hawking via phone at this point will maintain current medication doses-an updated lab on Monday-her weight has stabilized       Hypokalemia.  Carefully increasing her K-Dur to 60 b.i.d. from 40 b.i.d.  potassium is now on low normal range   CMP on Monday.         CPT CODE: 16109

## 2014-09-19 ENCOUNTER — Encounter (INDEPENDENT_AMBULATORY_CARE_PROVIDER_SITE_OTHER): Payer: Self-pay | Admitting: *Deleted

## 2014-09-19 ENCOUNTER — Encounter: Payer: Self-pay | Admitting: Internal Medicine

## 2014-09-19 ENCOUNTER — Encounter (HOSPITAL_COMMUNITY)
Admission: RE | Admit: 2014-09-19 | Discharge: 2014-09-19 | Disposition: A | Discharge status: Home / Self Care | Payer: Medicare Other | Source: Skilled Nursing Facility | Attending: Internal Medicine | Admitting: Internal Medicine

## 2014-09-19 ENCOUNTER — Ambulatory Visit (INDEPENDENT_AMBULATORY_CARE_PROVIDER_SITE_OTHER): Payer: Medicare Other | Admitting: Internal Medicine

## 2014-09-19 ENCOUNTER — Non-Acute Institutional Stay (SKILLED_NURSING_FACILITY): Payer: Medicare Other | Admitting: Internal Medicine

## 2014-09-19 ENCOUNTER — Encounter (INDEPENDENT_AMBULATORY_CARE_PROVIDER_SITE_OTHER): Payer: Self-pay | Admitting: Internal Medicine

## 2014-09-19 VITALS — BP 130/70 | HR 82 | Temp 98.8°F | Resp 18 | Ht 68.0 in | Wt 238.7 lb

## 2014-09-19 DIAGNOSIS — K7290 Hepatic failure, unspecified without coma: Secondary | ICD-10-CM | POA: Diagnosis not present

## 2014-09-19 DIAGNOSIS — K7682 Hepatic encephalopathy: Secondary | ICD-10-CM

## 2014-09-19 DIAGNOSIS — D638 Anemia in other chronic diseases classified elsewhere: Secondary | ICD-10-CM | POA: Diagnosis not present

## 2014-09-19 DIAGNOSIS — K7031 Alcoholic cirrhosis of liver with ascites: Secondary | ICD-10-CM | POA: Diagnosis not present

## 2014-09-19 DIAGNOSIS — D696 Thrombocytopenia, unspecified: Secondary | ICD-10-CM

## 2014-09-19 DIAGNOSIS — K7010 Alcoholic hepatitis without ascites: Secondary | ICD-10-CM | POA: Diagnosis not present

## 2014-09-19 DIAGNOSIS — R635 Abnormal weight gain: Secondary | ICD-10-CM | POA: Diagnosis not present

## 2014-09-19 LAB — COMPREHENSIVE METABOLIC PANEL
ALK PHOS: 165 U/L — AB (ref 38–126)
ALT: 43 U/L (ref 14–54)
AST: 53 U/L — AB (ref 15–41)
Albumin: 1.8 g/dL — ABNORMAL LOW (ref 3.5–5.0)
Anion gap: 5 (ref 5–15)
BILIRUBIN TOTAL: 6.5 mg/dL — AB (ref 0.3–1.2)
BUN: 15 mg/dL (ref 6–20)
CALCIUM: 8.4 mg/dL — AB (ref 8.9–10.3)
CO2: 30 mmol/L (ref 22–32)
Chloride: 103 mmol/L (ref 101–111)
Creatinine, Ser: 0.76 mg/dL (ref 0.44–1.00)
GFR calc Af Amer: >60 mL/min (ref >60)
GFR calc non Af Amer: >60 mL/min (ref >60)
Glucose, Bld: 131 mg/dL — ABNORMAL HIGH (ref 65–99)
Potassium: 4 mmol/L (ref 3.5–5.1)
SODIUM: 138 mmol/L (ref 135–145)
TOTAL PROTEIN: 5.4 g/dL — AB (ref 6.5–8.1)

## 2014-09-19 MED ORDER — PANTOPRAZOLE SODIUM 40 MG PO TBEC: 40.0000 mg | DELAYED_RELEASE_TABLET | Freq: Every day | ORAL | Status: DC

## 2014-09-19 NOTE — Progress Notes (Signed)
Presenting complaint;  Follow-up for decompensated alcoholic cirrhosis.  Subjective:  Patient is [age] year old Caucasian female who is here for scheduled visit accompanied by her daughter Whitney PostLogan. She feels much better. She is looking forward to going home. She is getting physical therapy. Her daughter states she is not able to walk from her bed to the bathroom without any difficulty. She has gained 26 pounds in the last 3 weeks. Her diuretic dose has been increased and she is believe she is passing more urine than she has been. Heartburn is well controlled with therapy. She denies nausea vomiting abdominal pain melena or rectal bleeding. She generally has 3-5 stools per day. She states she is on a low-salt diet. She tries not to drink too much fluids. Her daughter states she has not been confused since her last visit. She has not had any more falls since her last visit.   Current Medications: Outpatient Encounter Prescriptions as of 09/19/2014  Medication Sig  . acetaminophen (TYLENOL) 325 MG tablet Take 650 mg by mouth every 6 (six) hours as needed.  . B Complex-Biotin-FA (B-COMPLEX PO) Take 1 tablet by mouth daily.  . Calcium Carbonate-Vitamin D (CALCIUM 600+D) 600-400 MG-UNIT per tablet Take 1 tablet by mouth 2 (two) times daily.  [name] Kitchen. ENSURE (ENSURE) Take 1 Can by mouth 2 (two) times daily between meals.  . folic acid (FOLVITE) 1 MG tablet Take 1 tablet (1 mg total) by mouth daily.  . furosemide (LASIX) 40 MG tablet Take 1 tablet (40 mg total) by mouth daily. (Patient taking differently: Take 100 mg by mouth daily. )  . lactulose (CHRONULAC) 10 GM/15ML solution Take 15 mLs (10 g total) by mouth 2 (two) times daily.  . Multiple Vitamin (MULTIVITAMIN WITH MINERALS) TABS tablet Take 1 tablet by mouth daily.  . pantoprazole (PROTONIX) 40 MG tablet Take 1 tablet (40 mg total) by mouth 2 (two) times daily before a meal.  . Pollen Extracts (PROSTAT PO) Take 30 mLs by mouth 2 (two) times daily. between  meals  . potassium chloride (KLOR-CON) 20 MEQ packet Take 60 mEq by mouth 2 (two) times daily. potassium 60 meq  BIB  . predniSONE (DELTASONE) 10 MG tablet Take 3 tablets (30 mg total) by mouth daily with breakfast.  . spironolactone (ALDACTONE) 25 MG tablet Take 25 mg by mouth 2 (two) times daily.  [name] Kitchen. thiamine 100 MG tablet Take 1 tablet (100 mg total) by mouth daily.  . [DISCONTINUED] furosemide (LASIX) 40 MG tablet Take 40 mg by mouth.  . [DISCONTINUED] LORazepam (ATIVAN) 0.5 MG tablet Take 1 tablet (0.5 mg total) by mouth every 8 (eight) hours. (Patient not taking: Reported on 08/30/2014)   No facility-administered encounter medications on file as of 09/19/2014.     Objective: Blood pressure 130/70, pulse 82, temperature 98.8 F (37.1 C), temperature source Oral, resp. rate 18, height 5\' 8"  (1.727 m), weight 238 lb 11.2 oz (108.274 kg). Patient is alert and does not have asterixis. Conjunctiva is pink. Sclera is icteric Oropharyngeal mucosa is normal. No neck masses or thyromegaly noted. Cardiac exam with regular rhythm normal S1 and S2. No murmur or gallop noted. Lungs are clear to auscultation. Abdomen is full but not tense or tender. Left lobe the liver is palpable nodes formed. Pitting edema noted to lower abdominal wall as well as both flanks. Pitting edema noted to both lower extremities up to the level of eyes. It is 3+ involving both legs. She also has pitting edema to posterior aspect of  right arm and forearm(site of previous ecchymoses)  Labs/studies Results: CBC from 09/12/2014  WBC 5.8, H&H 11.8 and 36.8 and platelet count 46K    Recent Labs  09/19/14 0630  NA 138  K 4.0  CL 103  CO2 30  GLUCOSE 131*  BUN 15  CREATININE 0.76  CALCIUM 8.4*    LFT   Recent Labs  09/19/14 0630  PROT 5.4*  ALBUMIN 1.8*  AST 53*  ALT 43  ALKPHOS 165*  BILITOT 6.5*     Assessment:  #1. Decompensated alcoholic liver disease. Bilirubin and AST is slowly coming down. Serum  albumen remains low. She is not out of the woods yet.  She will be coming off prednisone in one week. #2. Fluid overload. She has gained 26 pounds in the last 3 weeks. Most of the fluid appears to be in her lower extremities and flanks and she has mild to moderate ascites. I do not believe abdominal present sees is indicated. He will benefit from a few doses of IV albumin and metolazone. Fluid overload will improve as hepatic function recovers and serum albumin rises. #3. Hepatic encephalopathy. Clinically she does not appear to be encephalopathic #4. GERD. Heartburn well controlled with therapy. PPI dose could be reduced. #5. Thrombocytopenia secondary to cirrhosis and splenomegaly.   Plan:  Decrease pantoprazole to 40 mg by mouth every morning. Decrease by mouth fluid intake to 1.5 L or 50 ounces per day. 50 g of albumin IV daily 3 doses. Metolazone 5 mg by mouth on 67 and 09/23/2014. Metabolic 7 on 09/26/2014. Daily weight for 2 weeks. Office visit in 4 weeks.

## 2014-09-19 NOTE — Progress Notes (Signed)
Patient ID: Marissa Figueroa, female   DOB: 02/16/1956, 59 y.o.   MRN: [id number]         This is a discharge note.  Level care skilled.  Facility [facility].  Chief complaint discharge note.          HPI--Ms.  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 Her issues include hepatic encephalopathy [now more stable], refractory jaundice [now with Marissa Figueroa remitted 6.5 which is down but reasonably stable] and more recently increasing weights. Most recently weight 237 this appears to stabilize the last few days-she is now on Aldactone 25 mg twice a day as well as Lasix 100 mg daily  In terms of her weights it would appear that she was to 228.2 on admission, since then she switched to a bed with away scale. Her first weight there was 209.8 she is steadily increasedthe high 230's   which is over a 20 pound weight gain. She is noted the increasing edema, feels her abdomen may be more distended but she is not short of breath.  She actually saw Dr. Rehman-gastroenterologist today who is following her closely-he did order albumen injections also metolazone tomorrow and on June 10-although clinically she appears to be improving but he remains concerned stating she's not out of the woods yet  She hopes to go home with her daughter later this week-she again has made significant improvement during her stay here although she is a quite fragile individual as noted by Dr. Karilyn Figueroa   Lab work from today June 6 shows a sodium of 1:30 potassium 4 BUN 15 creatinine 0.76-albumen of 1.8 AST 59 alkaline phosphatase 165 which her baseline bilirubin appears to be somewhat improved at 6.5  Lab work from5-30-16 CMP; sodium at 136 potassium at 4 BUN of 14 creatinine of 0.66 albumen at 2 AST 59 ALT 53 alkaline phosphatase 164 total bilirubin at 8.3. The bilirubin is unchanged from one week ago. CBC; white count of 5.8, hemoglobin 11.8, platelets at 46,000  Review of  systems; General no complaints of fever or chills again she's had significant weight gain.  Skin does not complain of rashes or itching does have generalized jaundice.  Head ears eyes nose mouth and throat does not complain of visual changes or sore throat Respiratory no shortness of breath Cardiac no chest pain GI feels her abdomen is distended, not complaining of abdominal pain apparently is having bowel movements.  GU does not complain of dysuria.  Muscle skeletal has gained strength still ambulate mainly in a wheelchair  Neurologic does not complain of dizziness or headache or syncopal-type feelings.  Psych appears to be stable does not complaining of anxiety this appears to have significantly improved during her stay as well  Physical examination T- 97.5 pulse 75 respirations 20 blood pressure 118/63 weight is 237 Gen.; still marked icterus however she appears to be cognitively intact right alert pleasant and appropriate looking forward to going home.  Her skin noted above-it is warm and dry.     Respiratory clear entry bilaterally Cardiac heart sounds are normal JVP is not elevated.-- Abdomen; mildly distended soft nontender with positive bowel sounds Extremities; 10 years with significant edema lower extremities would say 2-3 plus this has not increased from previous exams area and [place]gra[place] neurologic is grossly intact her speech is clear.  Psych she is alert and oriented pleasant and appropriate  Impression/plan #1 Marissa Figueroa increased edema weight gain-this appears to be somewhat stabilize she is  closely followed by Dr. Myrtis Figueroa--he has added [facility]azone to [facility] and Aldactone-also she will be receiving albumin IV per GI- She continues on prednisone as well #2 severe anemia on her admission--as appears to have improved hemoglobin 11.8 on May 30 #3 thrombocytopenia no doubt due to portal hypertension this is stable she has had no bleeding complications. #4 hepatic  encephalopathy; this seems to be controlled on lactulose 45 ML's 3 times a day. Her mental status has improved fairly dramatically which is encouraging.  \Again plans are for her to return home by the end of the week she will be with her daughter who appears to be quite supportive-she will need continued therapy and home health follow-p for continued weakness and multiple medical issues as noted above as well as expedient GI follow-up which certainly appears to be the case  [id number]  .  [id number]-of note greater than 30 minutes spent on this discharge summary

## 2014-09-19 NOTE — Patient Instructions (Signed)
Daily weight for next 2 weeks. Please call office with progress report on 09/26/2014. By mouth fluid restriction to 50 ounces or 1500 mL per 24 hours. Metolazone 5 mg by mouth on 09/20/2014 and 09/23/2014. Next blood work on 09/26/2014

## 2014-09-21 ENCOUNTER — Encounter (HOSPITAL_COMMUNITY): Payer: Medicare Other

## 2014-09-21 DIAGNOSIS — K7030 Alcoholic cirrhosis of liver without ascites: Secondary | ICD-10-CM | POA: Insufficient documentation

## 2014-09-21 MED ORDER — SODIUM CHLORIDE 0.9 % IV SOLN
INTRAVENOUS | Status: DC
  Administered 2014-09-21: 200 mL via INTRAVENOUS

## 2014-09-21 MED ORDER — ALBUMIN HUMAN 25 % IV SOLN
50.0000 g | Freq: Once | INTRAVENOUS | Status: DC
  Filled 2014-09-21: qty 200

## 2014-09-21 MED ORDER — ALBUMIN HUMAN 25 % IV SOLN
50.0000 g | Freq: Once | INTRAVENOUS | Status: DC
  Administered 2014-09-21 (×2): 50 g via INTRAVENOUS

## 2014-09-21 NOTE — Progress Notes (Signed)
Tolerated first albumin infusion well. IV left saline locked for infusion tomorrow.

## 2014-09-22 ENCOUNTER — Encounter (HOSPITAL_COMMUNITY)
Admission: RE | Admit: 2014-09-22 | Discharge: 2014-09-22 | Disposition: A | Discharge status: Home / Self Care | Payer: Medicare Other | Source: Ambulatory Visit | Attending: Internal Medicine | Admitting: Internal Medicine

## 2014-09-22 ENCOUNTER — Telehealth (INDEPENDENT_AMBULATORY_CARE_PROVIDER_SITE_OTHER): Payer: Self-pay | Admitting: *Deleted

## 2014-09-22 MED ORDER — ALBUMIN HUMAN 25 % IV SOLN
50.0000 g | Freq: Once | INTRAVENOUS | Status: AC
  Administered 2014-09-22: 50 g via INTRAVENOUS
  Filled 2014-09-22: qty 200

## 2014-09-22 NOTE — Telephone Encounter (Signed)
Dr. Karilyn Cota wanted Marissa Figueroa to have lab work on 09/26/14. Per his notes, she is to have a Metabolic 7 on 09/26/2014. She is going home from [facility] on 09/23/14. Would like to come by and pick up the lab order since she will not be at the [facility] to get this done. Her return phone number is [phone].

## 2014-09-23 ENCOUNTER — Inpatient Hospital Stay
Admission: RE | Admit: 2014-09-23 | Discharge: 2014-09-23 | Disposition: A | Discharge status: Home / Self Care | Payer: Medicaid Other | Source: Ambulatory Visit | Attending: Internal Medicine | Admitting: Internal Medicine

## 2014-09-23 ENCOUNTER — Encounter (HOSPITAL_COMMUNITY)
Admission: RE | Admit: 2014-09-23 | Discharge: 2014-09-23 | Disposition: A | Discharge status: Home / Self Care | Payer: Medicare Other | Source: Ambulatory Visit | Attending: Internal Medicine | Admitting: Internal Medicine

## 2014-09-23 LAB — BASIC METABOLIC PANEL
Anion gap: 9 (ref 5–15)
BUN: 16 mg/dL (ref 6–20)
CALCIUM: 9 mg/dL (ref 8.9–10.3)
CHLORIDE: 105 mmol/L (ref 101–111)
CO2: 27 mmol/L (ref 22–32)
Creatinine, Ser: 0.75 mg/dL (ref 0.44–1.00)
GFR calc Af Amer: >60 mL/min (ref >60)
GFR calc non Af Amer: >60 mL/min (ref >60)
Glucose, Bld: 113 mg/dL — ABNORMAL HIGH (ref 65–99)
Potassium: 4 mmol/L (ref 3.5–5.1)
SODIUM: 141 mmol/L (ref 135–145)

## 2014-09-23 MED ORDER — ALBUMIN HUMAN 25 % IV SOLN
50.0000 g | Freq: Once | INTRAVENOUS | Status: AC
  Administered 2014-09-23: 50 g via INTRAVENOUS
  Filled 2014-09-23: qty 200

## 2014-09-24 NOTE — Progress Notes (Signed)
Patient ID: Marissa Figueroa, female   DOB: Aug 16, 1955, 59 y.o.   MRN: [id number] Of note more than 30 minutes was spent preparing this discharge summary.  Family medical social history was reviewed per previous progress notes including Aug 19 2014 As well as GI note on 09/19/2014

## 2014-09-26 LAB — AMMONIA: Ammonia: 109 umol/L — ABNORMAL HIGH (ref 16–53)

## 2014-09-27 ENCOUNTER — Telehealth (INDEPENDENT_AMBULATORY_CARE_PROVIDER_SITE_OTHER): Payer: Self-pay | Admitting: *Deleted

## 2014-09-27 ENCOUNTER — Other Ambulatory Visit (INDEPENDENT_AMBULATORY_CARE_PROVIDER_SITE_OTHER): Payer: Self-pay | Admitting: Internal Medicine

## 2014-09-27 DIAGNOSIS — K7031 Alcoholic cirrhosis of liver with ascites: Secondary | ICD-10-CM

## 2014-09-27 LAB — PROTIME-INR
INR: 2.95 — AB (ref <1.50)
Prothrombin Time: 30.7 seconds — ABNORMAL HIGH (ref 11.6–15.2)

## 2014-09-27 LAB — HEPATIC FUNCTION PANEL
ALT: 29 U/L (ref 0–35)
AST: 43 U/L — AB (ref 0–37)
Albumin: 2.7 g/dL — ABNORMAL LOW (ref 3.5–5.2)
Alkaline Phosphatase: 157 U/L — ABNORMAL HIGH (ref 39–117)
Bilirubin, Direct: 3 mg/dL — ABNORMAL HIGH (ref 0.0–0.3)
Indirect Bilirubin: 5 mg/dL — ABNORMAL HIGH (ref 0.2–1.2)
Total Bilirubin: 8 mg/dL — ABNORMAL HIGH (ref 0.2–1.2)
Total Protein: 5.6 g/dL — ABNORMAL LOW (ref 6.0–8.3)

## 2014-09-27 LAB — CBC
HCT: 32.4 % — ABNORMAL LOW (ref 36.0–46.0)
Hemoglobin: 11.2 g/dL — ABNORMAL LOW (ref 12.0–15.0)
MCH: 34.1 pg — ABNORMAL HIGH (ref 26.0–34.0)
MCHC: 34.6 g/dL (ref 30.0–36.0)
MCV: 98.8 fL (ref 78.0–100.0)
PLATELETS: 41 10*3/uL — AB (ref 150–400)
RBC: 3.28 MIL/uL — ABNORMAL LOW (ref 3.87–5.11)
RDW: 16.4 % — AB (ref 11.5–15.5)
WBC: 5.4 10*3/uL (ref 4.0–10.5)

## 2014-09-27 NOTE — Telephone Encounter (Signed)
.  Per Delrae Rend patient is to have labs on 09-29-14.

## 2014-09-27 NOTE — Telephone Encounter (Signed)
Per Tiffany @ [facility] the patient has a critical PLT of 41. This was confirmed by smear. She is going to fax over copy of results.  Terri Setzer,Np - C was made aware.

## 2014-09-27 NOTE — Telephone Encounter (Signed)
Noted. Has been this low in the past.

## 2014-09-27 NOTE — Telephone Encounter (Signed)
Marissa Figueroa will have a CBC with diff and ammonia Thursday. She has been off her Lactulose for 3 days. She just started the Lactulose back. I advised her to take the Lactulose three times a day for now. Will recheck an ammonia Thursday. Will repeat a CBC and Ammonia Thursday. Tammy is putting the order in .

## 2014-09-27 NOTE — Telephone Encounter (Signed)
CBC and Ammonia level has been ordered for Thursday. Lactulose to three times a day

## 2014-09-29 LAB — CBC
HEMATOCRIT: 31.7 % — AB (ref 36.0–46.0)
Hemoglobin: 11.4 g/dL — ABNORMAL LOW (ref 12.0–15.0)
MCH: 34.7 pg — ABNORMAL HIGH (ref 26.0–34.0)
MCHC: 36 g/dL (ref 30.0–36.0)
MCV: 96.4 fL (ref 78.0–100.0)
Platelets: 52 10*3/uL — ABNORMAL LOW (ref 150–400)
RBC: 3.29 MIL/uL — ABNORMAL LOW (ref 3.87–5.11)
RDW: 16.2 % — AB (ref 11.5–15.5)
WBC: 5.1 10*3/uL (ref 4.0–10.5)

## 2014-09-29 LAB — AMMONIA: AMMONIA: 67 umol/L — AB (ref 16–53)

## 2014-09-30 ENCOUNTER — Telehealth (INDEPENDENT_AMBULATORY_CARE_PROVIDER_SITE_OTHER): Payer: Self-pay | Admitting: *Deleted

## 2014-09-30 DIAGNOSIS — K7031 Alcoholic cirrhosis of liver with ascites: Secondary | ICD-10-CM

## 2014-09-30 NOTE — Telephone Encounter (Signed)
.  Per Terri Setzer,NP patient is to have labs drawn in 2 weeks. 

## 2014-10-04 ENCOUNTER — Ambulatory Visit (INDEPENDENT_AMBULATORY_CARE_PROVIDER_SITE_OTHER): Payer: Medicare Other | Admitting: Internal Medicine

## 2014-10-04 ENCOUNTER — Encounter (INDEPENDENT_AMBULATORY_CARE_PROVIDER_SITE_OTHER): Payer: Self-pay | Admitting: Internal Medicine

## 2014-10-04 VITALS — BP 104/40 | HR 64 | Temp 97.9°F | Ht 69.0 in | Wt 226.0 lb

## 2014-10-04 DIAGNOSIS — K7030 Alcoholic cirrhosis of liver without ascites: Secondary | ICD-10-CM

## 2014-10-04 NOTE — Patient Instructions (Signed)
Take Lactulose 30cc TID.  Get Folic acid and Thiamine 100mg .

## 2014-10-04 NOTE — Progress Notes (Signed)
Subjective:    Patient ID: Marissa Figueroa, female    DOB: 06/23/1955, 59 y.o.   MRN: [id number]  HPI Here today for f/u of decompensated liver disease. Hx significant for etoh abuse. Her weighty 09/19/2014 238. Today her weight is  226.  On  09/19/2014 she was given Albumin 50GM x 3  And Metolazone. She tells me she feels great. No confusion. Her appetite is good. She does have some acid reflux. She is having 5-6 stools a day. No melena or BRRB. She has not drank since Mother's Day.  Recently spent time at [facility] for Rehab. She does c/o of edema to her lower extremities.    08/15/2014 EGD: Upper GI bleed:   Impression: Portal gastropathy. No evidence of esophageal or gastric varices. No evidence of peptic ulcer disease. Suspect she may have bled from Mallory-Weiss tear which has healed by now     09/17/2014 Ammonia 67  CMP Latest Ref Rng 09/26/2014 09/23/2014 09/19/2014  Glucose 65 - 99 mg/dL - 409(W) 119(J)  BUN 6 - 20 mg/dL - 16 15  Creatinine 4.78 - 1.00 mg/dL - 2.95 6.21  Sodium 308 - 145 mmol/L - 141 138  Potassium 3.5 - 5.1 mmol/L - 4.0 4.0  Chloride 101 - 111 mmol/L - 105 103  CO2 22 - 32 mmol/L - 27 30  Calcium 8.9 - 10.3 mg/dL - 9.0 6.5(H)  Total Protein 6.0 - 8.3 g/dL 8.4(O) - 5.4(L)  Total Bilirubin 0.2 - 1.2 mg/dL 8.0(H) - 6.5(H)  Alkaline Phos 39 - 117 U/L 157(H) - 165(H)  AST 0 - 37 U/L 43(H) - 53(H)  ALT 0 - 35 U/L 29 - 43   Hepatic Function Panel     Component Value Date/Time   PROT 5.6* 09/26/2014 0936   ALBUMIN 2.7* 09/26/2014 0936   AST 43* 09/26/2014 0936   ALT 29 09/26/2014 0936   ALKPHOS 157* 09/26/2014 0936   BILITOT 8.0* 09/26/2014 0936   BILIDIR 3.0* 09/26/2014 0936   IBILI 5.0* 09/26/2014 0936   CBC    Component Value Date/Time   WBC 5.1 09/27/2014 1033   RBC 3.29* 09/27/2014 1033   HGB 11.4* 09/27/2014 1033   HCT 31.7* 09/27/2014 1033   PLT 52* 09/27/2014 1033   MCV 96.4 09/27/2014 1033   MCH 34.7* 09/27/2014 1033   MCHC 36.0  09/27/2014 1033   RDW 16.2* 09/27/2014 1033   LYMPHSABS 1.7 09/05/2014 0630   MONOABS 0.4 09/05/2014 0630   EOSABS 0.3 09/05/2014 0630   BASOSABS 0.0 09/05/2014 0630          Review of Systems Past Medical History  Diagnosis Date  . Cirrhosis     ALCOHOLIC  . Alcoholic cirrhosis of liver   . CHF (congestive heart failure)   . Shortness of breath     exertion    Past Surgical History  Procedure Laterality Date  . Tubal ligation    . Tonsilectomy, adenoidectomy, bilateral myringotomy and tubes    . Paracentesis  07/06/08  . Paracentesis  05/25/2008  . Esophagogastroduodenoscopy (egd) with propofol N/A 08/15/2014    Procedure: ESOPHAGOGASTRODUODENOSCOPY (EGD) WITH PROPOFOL;  Surgeon: Malissa Hippo, MD;  Location: [facility];  Service: Endoscopy;  Laterality: N/A;    Allergies  Allergen Reactions  . Ultram [Tramadol] Other (See Comments)    Headache    Current Outpatient Prescriptions on File Prior to Visit  Medication Sig Dispense Refill  . B Complex-Biotin-FA (B-COMPLEX PO) Take 1 tablet by mouth  daily.    . Calcium Carbonate-Vitamin D (CALCIUM 600+D) 600-400 MG-UNIT per tablet Take 1 tablet by mouth 2 (two) times daily.    [name] Kitchen ENSURE (ENSURE) Take 1 Can by mouth 2 (two) times daily between meals.    . folic acid (FOLVITE) 1 MG tablet Take 1 tablet (1 mg total) by mouth daily.    . furosemide (LASIX) 40 MG tablet Take 1 tablet (40 mg total) by mouth daily. (Patient taking differently: Take 20 mg by mouth daily. Takes 5 Lasix in the am daily.) 30 tablet 3  . lactulose (CHRONULAC) 10 GM/15ML solution Take 15 mLs (10 g total) by mouth 2 (two) times daily. (Patient taking differently: Take 30 g by mouth 2 (two) times daily. ) 960 mL 5  . Multiple Vitamin (MULTIVITAMIN WITH MINERALS) TABS tablet Take 1 tablet by mouth daily.    . pantoprazole (PROTONIX) 40 MG tablet Take 1 tablet (40 mg total) by mouth daily before breakfast.    . potassium chloride (KLOR-CON) 20 MEQ packet Take  60 mEq by mouth 2 (two) times daily. potassium 60 meq  BIB    . spironolactone (ALDACTONE) 25 MG tablet Take 25 mg by mouth 2 (two) times daily.    [name] Kitchen thiamine 100 MG tablet Take 1 tablet (100 mg total) by mouth daily.    . predniSONE (DELTASONE) 10 MG tablet Take 3 tablets (30 mg total) by mouth daily with breakfast. (Patient taking differently: Take 5 mg by mouth daily with breakfast. )     Current Facility-Administered Medications on File Prior to Visit  Medication Dose Route Frequency Provider Last Rate Last Dose  . albumin human 25 % solution 50 g  50 g Intravenous Once Maxwell Caul, MD            Objective:   Physical Exam Blood pressure 104/40, pulse 64, temperature 97.9 F (36.6 C), height 5\' 9"  (1.753 m), weight 226 lb (102.513 kg).  Alert and oriented. Skin warm and dry. Oral mucosa is moist.   . Sclera icteric, conjunctivae is pink. Thyroid not enlarged. No cervical lymphadenopathy. Lungs clear. Heart regular rate and rhythm Murmur heard.   Abdomen is soft. Bowel sounds are positive. No hepatomegaly. No abdominal masses felt. No tenderness. 3-4+ edema to lower extremities. ? ceillulitis       Assessment & Plan:  Decompensated liver disease.  I discussed with Dr. Karilyn Cota. Albumin 50gm daily x 3 followed by Lasix 40mg   OV in 2 weeks. Folic acid 1mg  daily. Thiamine 100mg  daily. Decrease Lactulose to 91ml TID OV in 2 weeks with Dr. Karilyn Cota

## 2014-10-05 ENCOUNTER — Encounter (HOSPITAL_COMMUNITY)
Admission: RE | Admit: 2014-10-05 | Discharge: 2014-10-05 | Disposition: A | Discharge status: Home / Self Care | Payer: Medicare Other | Source: Ambulatory Visit | Attending: Internal Medicine | Admitting: Internal Medicine

## 2014-10-05 DIAGNOSIS — K7030 Alcoholic cirrhosis of liver without ascites: Secondary | ICD-10-CM | POA: Diagnosis not present

## 2014-10-05 MED ORDER — ALBUMIN HUMAN 25 % IV SOLN
50.0000 g | Freq: Once | INTRAVENOUS | Status: AC
  Administered 2014-10-05: 50 g via INTRAVENOUS
  Filled 2014-10-05: qty 200

## 2014-10-05 MED ORDER — SODIUM CHLORIDE 0.9 % IV SOLN
INTRAVENOUS | Status: DC
  Administered 2014-10-05: 200 mL via INTRAVENOUS

## 2014-10-05 MED ORDER — FUROSEMIDE 10 MG/ML IJ SOLN
40.0000 mg | Freq: Once | INTRAMUSCULAR | Status: AC
  Administered 2014-10-05: 40 mg via INTRAVENOUS
  Filled 2014-10-05: qty 4

## 2014-10-06 ENCOUNTER — Encounter (HOSPITAL_COMMUNITY)
Admission: RE | Admit: 2014-10-06 | Discharge: 2014-10-06 | Disposition: A | Discharge status: Home / Self Care | Payer: Medicare Other | Source: Ambulatory Visit | Attending: Internal Medicine | Admitting: Internal Medicine

## 2014-10-06 DIAGNOSIS — K7030 Alcoholic cirrhosis of liver without ascites: Secondary | ICD-10-CM | POA: Diagnosis not present

## 2014-10-06 MED ORDER — SODIUM CHLORIDE 0.9 % IV SOLN
INTRAVENOUS | Status: DC
  Administered 2014-10-06: 200 mL via INTRAVENOUS

## 2014-10-06 MED ORDER — FUROSEMIDE 10 MG/ML IJ SOLN
40.0000 mg | Freq: Once | INTRAMUSCULAR | Status: AC
  Administered 2014-10-06: 40 mg via INTRAVENOUS
  Filled 2014-10-06: qty 4

## 2014-10-06 MED ORDER — ALBUMIN HUMAN 25 % IV SOLN
50.0000 g | Freq: Once | INTRAVENOUS | Status: AC
  Administered 2014-10-06: 50 g via INTRAVENOUS
  Filled 2014-10-06: qty 200

## 2014-10-06 MED ORDER — FUROSEMIDE 8 MG/ML PO SOLN: 40.0000 mg | Freq: Once | ORAL | Status: DC

## 2014-10-06 MED ORDER — FUROSEMIDE 10 MG/ML IJ SOLN
40.0000 mg | Freq: Once | INTRAMUSCULAR | Status: AC
  Administered 2014-10-06: 40 mg via INTRAVENOUS

## 2014-10-07 ENCOUNTER — Encounter (HOSPITAL_COMMUNITY)
Admission: RE | Admit: 2014-10-07 | Discharge: 2014-10-07 | Disposition: A | Discharge status: Home / Self Care | Payer: Medicare Other | Source: Ambulatory Visit | Attending: Internal Medicine | Admitting: Internal Medicine

## 2014-10-07 ENCOUNTER — Encounter (HOSPITAL_COMMUNITY): Payer: Self-pay

## 2014-10-07 DIAGNOSIS — K7030 Alcoholic cirrhosis of liver without ascites: Secondary | ICD-10-CM | POA: Diagnosis not present

## 2014-10-07 MED ORDER — FUROSEMIDE 10 MG/ML IJ SOLN
40.0000 mg | Freq: Once | INTRAMUSCULAR | Status: AC
  Administered 2014-10-07: 40 mg via INTRAVENOUS
  Filled 2014-10-07: qty 4

## 2014-10-07 MED ORDER — ALBUMIN HUMAN 25 % IV SOLN
50.0000 g | Freq: Once | INTRAVENOUS | Status: AC
  Administered 2014-10-07: 50 g via INTRAVENOUS
  Filled 2014-10-07: qty 200

## 2014-10-07 MED ORDER — SODIUM CHLORIDE 0.9 % IV SOLN
INTRAVENOUS | Status: DC
  Administered 2014-10-07: 250 mL via INTRAVENOUS

## 2014-10-11 ENCOUNTER — Telehealth (INDEPENDENT_AMBULATORY_CARE_PROVIDER_SITE_OTHER): Payer: Self-pay | Admitting: *Deleted

## 2014-10-11 NOTE — Telephone Encounter (Signed)
Marissa CanesChristine came by for a weight check, 216.7 lbs.

## 2014-10-11 NOTE — Telephone Encounter (Signed)
Patient weighed 226 lbs at the time of OV 10-04-14. Today she weighed 216.7 lbs. Dr.Rehman made aware no further recommendation at this time.

## 2014-10-18 ENCOUNTER — Telehealth (INDEPENDENT_AMBULATORY_CARE_PROVIDER_SITE_OTHER): Payer: Self-pay | Admitting: *Deleted

## 2014-10-18 NOTE — Telephone Encounter (Signed)
She called and left a message that she thought Terri had called her  10/17/2014 10:39

## 2014-10-19 NOTE — Telephone Encounter (Signed)
I have talked with patient  

## 2014-10-20 ENCOUNTER — Ambulatory Visit (INDEPENDENT_AMBULATORY_CARE_PROVIDER_SITE_OTHER): Payer: Medicare Other | Admitting: Internal Medicine

## 2014-10-20 ENCOUNTER — Other Ambulatory Visit (INDEPENDENT_AMBULATORY_CARE_PROVIDER_SITE_OTHER): Payer: Self-pay | Admitting: Internal Medicine

## 2014-10-20 ENCOUNTER — Encounter (INDEPENDENT_AMBULATORY_CARE_PROVIDER_SITE_OTHER): Payer: Self-pay | Admitting: Internal Medicine

## 2014-10-20 ENCOUNTER — Telehealth (INDEPENDENT_AMBULATORY_CARE_PROVIDER_SITE_OTHER): Payer: Self-pay | Admitting: *Deleted

## 2014-10-20 VITALS — BP 120/68 | HR 74 | Temp 98.6°F | Resp 18 | Ht 69.0 in | Wt 212.0 lb

## 2014-10-20 DIAGNOSIS — K7030 Alcoholic cirrhosis of liver without ascites: Secondary | ICD-10-CM

## 2014-10-20 DIAGNOSIS — K7031 Alcoholic cirrhosis of liver with ascites: Secondary | ICD-10-CM

## 2014-10-20 MED ORDER — POTASSIUM CHLORIDE CRYS ER 20 MEQ PO TBCR: 60.0000 meq | EXTENDED_RELEASE_TABLET | Freq: Two times a day (BID) | ORAL | Status: DC

## 2014-10-20 MED ORDER — FOLIC ACID 1 MG PO TABS: 1.0000 mg | ORAL_TABLET | Freq: Every day | ORAL | Status: AC

## 2014-10-20 MED ORDER — SPIRONOLACTONE 25 MG PO TABS: 25.0000 mg | ORAL_TABLET | Freq: Two times a day (BID) | ORAL | Status: DC

## 2014-10-20 MED ORDER — PREDNISONE 10 MG PO TABS: 30.0000 mg | ORAL_TABLET | Freq: Every day | ORAL | Status: DC

## 2014-10-20 MED ORDER — PANTOPRAZOLE SODIUM 40 MG PO TBEC: 40.0000 mg | DELAYED_RELEASE_TABLET | Freq: Every day | ORAL | Status: DC

## 2014-10-20 NOTE — Patient Instructions (Signed)
Hepatic function in 1 week. Potassium to daily. Increase Spironalactone to  day

## 2014-10-20 NOTE — Telephone Encounter (Signed)
.  Per Delrae Renderri Setzer,NP patient is to have lab work in 1 week.

## 2014-10-20 NOTE — Progress Notes (Addendum)
Subjective:    Patient ID: Marissa Figueroa, female    DOB: [birth year], 59 y.o.   MRN: [id number]  HPI Here today for f/u of her alcoholic liver disease. She is walking daily. Physical therapy assists her at home.  She is using her walker at home. She is taking the lactulose BID. She is having 3-4 stools a day.  Her sister is in the room today.  She has had weight loss.  Weight 10/04/2014 226 Today her weight is 212. She has lost 14 pounds.  She is eating fruits. She says she feels good. She is drinking Ensure daily. She is not drinking any ETOH. 10/13/2014 H and H 11.7 and 33.0.,  Platelet ct 68, total bili 8.4, Albumin 3.4, ALP 190,. AST 52, ALT 28 Two weeks ago she was given Albumin 50gms IV x 3 days with Lasix 3m Her bilirubin has increased from 8.0 to 8.4.    Labs 10/13/2014 total bili 8.4, ALP 190, AST 52, ALT 28, Albumin 3.4 Ammonia 92  Hepatic Function Latest Ref Rng 09/26/2014 09/19/2014 09/12/2014  Total Protein 6.0 - 8.3 g/dL 5.6(L) 5.4(L) 5.8(L)  Albumin 3.5 - 5.2 g/dL 2.7(L) 1.8(L) 2.0(L)  AST 0 - 37 U/L 43(H) 53(H) 59(H)  ALT 0 - 35 U/L 29 43 53  Alk Phosphatase 39 - 117 U/L 157(H) 165(H) 164(H)  Total Bilirubin 0.2 - 1.2 mg/dL 8.0(H) 6.5(H) 8.3(H)  Bilirubin, Direct 0.0 - 0.3 mg/dL 3.0(H) - -    10/13/2014 H and H 11.7 and 33.0, MCV 94, Platelet ct 68    Review of Systems Current Outpatient Prescriptions  Medication Sig Dispense Refill  . B Complex-Biotin-FA (B-COMPLEX PO) Take 1 tablet by mouth daily.    . cholecalciferol (VITAMIN D) 400 UNITS TABS tablet Take 1,000 Units by mouth.    . ENSURE (ENSURE) Take 1 Can by mouth 2 (two) times daily between meals.    . furosemide (LASIX) 40 MG tablet Take 1 tablet (40 mg total) by mouth daily. (Patient taking differently: Take 20 mg by mouth daily. Takes 5 Lasix in the am daily.) 30 tablet 3  . lactulose (CHRONULAC) 10 GM/15ML solution Take 15 mLs (10 g total) by mouth 2 (two) times daily. (Patient taking differently: Take  30 g by mouth 2 (two) times daily. ) 960 mL 5  . Multiple Vitamin (MULTIVITAMIN WITH MINERALS) TABS tablet Take 1 tablet by mouth daily.    . pantoprazole (PROTONIX) 40 MG tablet Take 1 tablet (40 mg total) by mouth daily before breakfast.    . potassium chloride (KLOR-CON) 20 MEQ packet Take 20 mEq by mouth 2 (two) times daily. Patient states that she takes 3 by mouth twice a day    . potassium chloride SA (KLOR-CON M20) 20 MEQ tablet Take 3 tablets (60 mEq total) by mouth 2 (two) times daily. 180 tablet 3  . predniSONE (DELTASONE) 10 MG tablet Take 3 tablets (30 mg total) by mouth daily with breakfast.    . spironolactone (ALDACTONE) 25 MG tablet Take 1 tablet (25 mg total) by mouth 2 (two) times daily. 60 tablet 3  . Calcium Carbonate-Vitamin D (CALCIUM 600+D) 600-400 MG-UNIT per tablet Take 1 tablet by mouth 2 (two) times daily.    . folic acid (FOLVITE) 1 MG tablet Take 1 tablet (1 mg total) by mouth daily.     No current facility-administered medications for this visit.       Objective:   Physical Exam Blood pressure 120/68, pulse 74, temperature  98.6 F (37 C), temperature source Oral, resp. rate 18, height 5' 9" (1.753 m), weight 212 lb (96.163 kg). Alert and oriented. Skin warm and dry. Oral mucosa is moist.   . Sclera icteric, conjunctivae is pink. Thyroid not enlarged. No cervical lymphadenopathy. Lungs clear. Heart regular rate and rhythm.  Abdomen is soft, obese. Bowel sounds are positive. No hepatomegaly. No abdominal masses felt. No tenderness.  3-4+ edema to lower extremities.         Assessment & Plan:  Alcoholic cirrhosis. Her bilirubin remains elevated as well as her ammonia. She was alert and oriented in office today. Her prognosis at this time is guarded. She needs to continue to not to drink. She will have an OV in 4 weeks. She will decrease her K to one a day. Increase Aldactone to 100mg daily 

## 2014-10-21 NOTE — Telephone Encounter (Signed)
Per Terri on 007/08/16 ad on a Ammonia level to be drawn along with the Hepatic Profile.

## 2014-10-21 NOTE — Addendum Note (Signed)
Addended by: Shona NeedlesDD, Jammal Sarr M on: 10/21/2014 08:00 AM   Modules accepted: Orders

## 2014-10-24 ENCOUNTER — Other Ambulatory Visit: Payer: Self-pay | Admitting: Internal Medicine

## 2014-10-24 NOTE — Addendum Note (Signed)
Addended by: Len BlalockSETZER, Shannon Balthazar L on: 10/24/2014 11:49 AM   Modules accepted: Medications

## 2014-10-25 ENCOUNTER — Other Ambulatory Visit (INDEPENDENT_AMBULATORY_CARE_PROVIDER_SITE_OTHER): Payer: Self-pay | Admitting: Internal Medicine

## 2014-10-25 ENCOUNTER — Telehealth (INDEPENDENT_AMBULATORY_CARE_PROVIDER_SITE_OTHER): Payer: Self-pay | Admitting: *Deleted

## 2014-10-25 DIAGNOSIS — R188 Other ascites: Secondary | ICD-10-CM

## 2014-10-25 MED ORDER — FUROSEMIDE 40 MG PO TABS: 40.0000 mg | ORAL_TABLET | Freq: Every day | ORAL | Status: AC

## 2014-10-25 NOTE — Telephone Encounter (Signed)
Stop the Prednisone. Potassium one a day. Lasix  a day. Spironalactone  a day

## 2014-10-25 NOTE — Telephone Encounter (Signed)
Stop the Prednisone. Lasix 40mg  day spironolactone 100mg  a day  This is per Dr. Karilyn Cotaehman I have spoken with pharmacy

## 2014-10-25 NOTE — Telephone Encounter (Signed)
Marissa Figueroa said in office Marissa Figueroa said to take one KLOR-CON daily. The Rx states 3 tablets by mouth twice daily. Please have Marissa Figueroa give her a call to clarify this. She is also needing a refill on her LASIX. Only has 5 left. The return phone number is [phone].

## 2014-10-25 NOTE — Telephone Encounter (Signed)
Message left at home 

## 2014-10-26 ENCOUNTER — Encounter (INDEPENDENT_AMBULATORY_CARE_PROVIDER_SITE_OTHER): Payer: Self-pay

## 2014-10-27 ENCOUNTER — Telehealth (INDEPENDENT_AMBULATORY_CARE_PROVIDER_SITE_OTHER): Payer: Self-pay | Admitting: *Deleted

## 2014-10-27 LAB — AMMONIA: Ammonia: 63 umol/L — ABNORMAL HIGH (ref 16–53)

## 2014-10-27 NOTE — Telephone Encounter (Signed)
noted 

## 2014-10-27 NOTE — Telephone Encounter (Addendum)
Marissa CanesChristine came by for a weight check - 219.5 lbs. She has been taking a little more of the Lactulose, the Lasix is keeping her mouth dry, not wanting to eat and not going to the bathroom as much. The return phone number is [phone]. Takes KLOR-CON one daily. Terri spoke with patient and advised her to drink lots of fluid and how to take all medications.

## 2014-10-28 LAB — HEPATIC FUNCTION PANEL
ALBUMIN: 2.6 g/dL — AB (ref 3.5–5.2)
ALT: 27 U/L (ref 0–35)
AST: 55 U/L — AB (ref 0–37)
Alkaline Phosphatase: 229 U/L — ABNORMAL HIGH (ref 39–117)
BILIRUBIN TOTAL: 8.2 mg/dL — AB (ref 0.2–1.2)
Bilirubin, Direct: 3 mg/dL — ABNORMAL HIGH (ref 0.0–0.3)
Indirect Bilirubin: 5.2 mg/dL — ABNORMAL HIGH (ref 0.2–1.2)
Total Protein: 6 g/dL (ref 6.0–8.3)

## 2014-10-31 ENCOUNTER — Telehealth (INDEPENDENT_AMBULATORY_CARE_PROVIDER_SITE_OTHER): Payer: Self-pay | Admitting: *Deleted

## 2014-10-31 ENCOUNTER — Other Ambulatory Visit (INDEPENDENT_AMBULATORY_CARE_PROVIDER_SITE_OTHER): Payer: Self-pay | Admitting: *Deleted

## 2014-10-31 MED ORDER — METOLAZONE 5 MG PO TABS: 5.0000 mg | ORAL_TABLET | Freq: Every day | ORAL | Status: DC

## 2014-10-31 NOTE — Telephone Encounter (Signed)
Marissa Figueroa would like to get her lab results that were done last week. Her weight today is 220 lbs. The return phone number is [phone].

## 2014-10-31 NOTE — Telephone Encounter (Signed)
Patient was called and made aware. She states that the Miralax is not working. She is taking 3 doses a day with no results. States that her stomach is swollen and hard. Per Dr.Rehman the patient can take 1 oz of Miralax a day. If she feels that she is constipated she may us an enema or suppository. If after the medication , Metalonzone, is taken and she still feels distended , she will need a tap. Patient called and made aware.

## 2014-10-31 NOTE — Telephone Encounter (Signed)
Patient was seen in the office on 07/07//16. Her weight at this time was 212 lbs. 8 pound increase. Patient has not heard from lab work.  I will ask Dr.Rehman to review , then the patient will be contacted.

## 2014-10-31 NOTE — Telephone Encounter (Signed)
Per Dr.Rehman - The patient is to take Metalazone 5 mg today and then another dose on 11/04/14. PO intake is 50 oz daily. Patient is to take the Lasix has been told , then 1 hour after taking the Lasix [facility]

## 2014-10-31 NOTE — Telephone Encounter (Signed)
Per Dr.Rehman the patient is to take 1 Metolazone 5 mg today , then take the 2 nd dose on 11/04/14. She is to take this 1 hour after taking her Lasix.

## 2014-11-02 ENCOUNTER — Telehealth (INDEPENDENT_AMBULATORY_CARE_PROVIDER_SITE_OTHER): Payer: Self-pay | Admitting: *Deleted

## 2014-11-02 NOTE — Telephone Encounter (Signed)
noted 

## 2014-11-02 NOTE — Telephone Encounter (Signed)
Weight as of today, 11/02/14, is 220.0 lbs. Marissa Figueroa said she is taking her medicines as directed.

## 2014-11-03 ENCOUNTER — Other Ambulatory Visit (INDEPENDENT_AMBULATORY_CARE_PROVIDER_SITE_OTHER): Payer: Self-pay | Admitting: Internal Medicine

## 2014-11-03 DIAGNOSIS — K7469 Other cirrhosis of liver: Secondary | ICD-10-CM

## 2014-11-03 DIAGNOSIS — R188 Other ascites: Secondary | ICD-10-CM

## 2014-11-03 NOTE — Telephone Encounter (Signed)
abd tap sch'd 11/04/14 at 11:00 (1045), patient aware

## 2014-11-03 NOTE — Telephone Encounter (Signed)
Patient states that she has not peed that much since taking the medicine, weight is 219 pounds. Stomach per patient is still big and hard. Per Dr.Rehman's previous instruction - precede with Abdominal Tap/ Patient was told that Dewayne Hatch would call her back with the information.

## 2014-11-04 ENCOUNTER — Ambulatory Visit (HOSPITAL_COMMUNITY)
Admission: RE | Admit: 2014-11-04 | Discharge: 2014-11-04 | Disposition: A | Discharge status: Home / Self Care | Payer: Medicare Other | Source: Ambulatory Visit | Attending: Internal Medicine | Admitting: Internal Medicine

## 2014-11-04 ENCOUNTER — Other Ambulatory Visit (INDEPENDENT_AMBULATORY_CARE_PROVIDER_SITE_OTHER): Payer: Self-pay | Admitting: Internal Medicine

## 2014-11-04 DIAGNOSIS — R188 Other ascites: Secondary | ICD-10-CM | POA: Insufficient documentation

## 2014-11-04 DIAGNOSIS — K7469 Other cirrhosis of liver: Secondary | ICD-10-CM

## 2014-11-07 ENCOUNTER — Telehealth (INDEPENDENT_AMBULATORY_CARE_PROVIDER_SITE_OTHER): Payer: Self-pay | Admitting: *Deleted

## 2014-11-07 NOTE — Telephone Encounter (Signed)
Marissa Figueroa said her weight for today, 11/07/14, is 211 lbs. Would like to get her lab results also. The return phone number is [phone].

## 2014-11-07 NOTE — Telephone Encounter (Signed)
I spoke with patient. She remembered I had already given her lab results.

## 2014-11-15 ENCOUNTER — Ambulatory Visit (INDEPENDENT_AMBULATORY_CARE_PROVIDER_SITE_OTHER): Payer: Medicare Other | Admitting: Internal Medicine

## 2014-11-15 ENCOUNTER — Telehealth (INDEPENDENT_AMBULATORY_CARE_PROVIDER_SITE_OTHER): Payer: Self-pay | Admitting: *Deleted

## 2014-11-15 NOTE — Telephone Encounter (Signed)
Weight on 11/11/14 was 219, 11/12/14 was 216, 11/13/14 was 216 and 11/14/14 was 213.

## 2014-11-16 NOTE — Telephone Encounter (Signed)
noted 

## 2014-11-17 ENCOUNTER — Telehealth (INDEPENDENT_AMBULATORY_CARE_PROVIDER_SITE_OTHER): Payer: Self-pay | Admitting: *Deleted

## 2014-11-17 NOTE — Telephone Encounter (Signed)
Insurance will not pay for the Pantoprazole. Per Karleen Dolphin , May try Omeprazole 20 mg take 1 by mouth BID #60 5 refills. This was called to [facility] - it did process as no charge to the patient. Patient was called but no answer.

## 2014-11-21 ENCOUNTER — Ambulatory Visit (INDEPENDENT_AMBULATORY_CARE_PROVIDER_SITE_OTHER): Payer: Medicare Other | Admitting: Internal Medicine

## 2014-11-24 ENCOUNTER — Telehealth (INDEPENDENT_AMBULATORY_CARE_PROVIDER_SITE_OTHER): Payer: Self-pay | Admitting: *Deleted

## 2014-11-24 NOTE — Telephone Encounter (Signed)
FYI  He weight is 228  She is eating but not alot  Taking Lactrolose and having 2 normal bowel movements a day

## 2014-12-14 ENCOUNTER — Ambulatory Visit (INDEPENDENT_AMBULATORY_CARE_PROVIDER_SITE_OTHER): Payer: Medicare Other | Admitting: Internal Medicine

## 2014-12-14 ENCOUNTER — Encounter (INDEPENDENT_AMBULATORY_CARE_PROVIDER_SITE_OTHER): Payer: Self-pay | Admitting: Internal Medicine

## 2014-12-14 VITALS — BP 108/50 | HR 72 | Temp 97.8°F | Ht 68.0 in | Wt 231.8 lb

## 2014-12-14 DIAGNOSIS — K7031 Alcoholic cirrhosis of liver with ascites: Secondary | ICD-10-CM

## 2014-12-14 MED ORDER — METOLAZONE 5 MG PO TABS
5.0000 mg | ORAL_TABLET | Freq: Every day | ORAL | Status: DC
Start: 2014-12-14 — End: 2014-12-27

## 2014-12-14 NOTE — Telephone Encounter (Signed)
Seen in office today  

## 2014-12-14 NOTE — Progress Notes (Addendum)
Subjective:    Patient ID: Marissa Figueroa, female    DOB: 23-Aug-1955, 59 y.o.   MRN: [id number]  HPI Here today for f/u of her alcoholic cirrhosis. She was last seen in July and her weight was 212. Her weight 10/04/2014 was 226.  On 11/24/2014 her weight was 228. Presently taking Keflex 500mg  for a cellulitis to her rt leg.  She said she underwent a doppler study and was negative for PE per patient. She has gained from  228 to 231.8. Her appetite is not good. She ate some baked chicken . She is trying to eat at least one meal a day. She says she just does not have an appetite. She is having a BM x 2 a day.  She is drinking fluids: lemonade and water.  She has not had any confusion. She is not drinking.   11/04/2014 [place] limited: No ascites.  Hepatic Function Panel     Component Value Date/Time   PROT 6.0 10/27/2014 1214   ALBUMIN 2.6* 10/27/2014 1214   AST 55* 10/27/2014 1214   ALT 27 10/27/2014 1214   ALKPHOS 229* 10/27/2014 1214   BILITOT 8.2* 10/27/2014 1214   BILIDIR 3.0* 10/27/2014 1214   IBILI 5.2* 10/27/2014 1214    CBC    Component Value Date/Time   WBC 5.1 09/27/2014 1033   RBC 3.29* 09/27/2014 1033   HGB 11.4* 09/27/2014 1033   HCT 31.7* 09/27/2014 1033   PLT 52* 09/27/2014 1033   MCV 96.4 09/27/2014 1033   MCH 34.7* 09/27/2014 1033   MCHC 36.0 09/27/2014 1033   RDW 16.2* 09/27/2014 1033   LYMPHSABS 1.7 09/05/2014 0630   MONOABS 0.4 09/05/2014 0630   EOSABS 0.3 09/05/2014 0630   BASOSABS 0.0 09/05/2014 0630          Review of Systems Past Medical History  Diagnosis Date  . Cirrhosis     ALCOHOLIC  . Alcoholic cirrhosis of liver   . CHF (congestive heart failure)   . Shortness of breath     exertion    Past Surgical History  Procedure Laterality Date  . Tubal ligation    . Tonsilectomy, adenoidectomy, bilateral myringotomy and tubes    . Paracentesis  07/06/08  . Paracentesis  05/25/2008  . Esophagogastroduodenoscopy (egd) with propofol  N/A 08/15/2014    Procedure: ESOPHAGOGASTRODUODENOSCOPY (EGD) WITH PROPOFOL;  Surgeon: Malissa Hippo, MD;  Location: [facility];  Service: Endoscopy;  Laterality: N/A;    Allergies  Allergen Reactions  . Ultram [Tramadol] Other (See Comments)    Headache    Current Outpatient Prescriptions on File Prior to Visit  Medication Sig Dispense Refill  . B Complex-Biotin-FA (B-COMPLEX PO) Take 1 tablet by mouth daily.    . Calcium Carbonate-Vitamin D (CALCIUM 600+D) 600-400 MG-UNIT per tablet Take 1 tablet by mouth 2 (two) times daily.    . cholecalciferol (VITAMIN D) 400 UNITS TABS tablet Take 1,000 Units by mouth.    . ENSURE (ENSURE) Take 1 Can by mouth daily.     . folic acid (FOLVITE) 1 MG tablet Take 1 tablet (1 mg total) by mouth daily.    . furosemide (LASIX) 40 MG tablet Take 1 tablet (40 mg total) by mouth daily. 30 tablet 3  . lactulose (CHRONULAC) 10 GM/15ML solution Take 15 mLs (10 g total) by mouth 2 (two) times daily. (Patient taking differently: Take 30 g by mouth 3 (three) times daily. ) 960 mL 5  . Multiple Vitamin (MULTIVITAMIN WITH MINERALS)  TABS tablet Take 1 tablet by mouth daily.    . pantoprazole (PROTONIX) 40 MG tablet Take 1 tablet (40 mg total) by mouth daily before breakfast.    . potassium chloride (KLOR-CON) 20 MEQ packet Take 20 mEq by mouth 2 (two) times daily. Patient states that she takes 3 by mouth twice a day    . potassium chloride SA (KLOR-CON M20) 20 MEQ tablet Take 3 tablets (60 mEq total) by mouth 2 (two) times daily. 180 tablet 3  . spironolactone (ALDACTONE) 25 MG tablet Take 1 tablet (25 mg total) by mouth 2 (two) times daily. (Patient taking differently: Take 100 mg by mouth daily. ) 60 tablet 3  . metolazone (ZAROXOLYN) 5 MG tablet Take 1 tablet (5 mg total) by mouth daily. (Patient not taking: Reported on 12/14/2014) 2 tablet 0   No current facility-administered medications on file prior to visit.        Objective:   Physical Exam Blood pressure  108/50, pulse 72, temperature 97.8 F (36.6 C), height  (1.727 m), weight 231 lb 12.8 oz (105.144 kg).  Alert and oriented. Skin warm and dry. Oral mucosa is moist.   . Sclera icteric, conjunctivae is pink. Thyroid not enlarged. No cervical lymphadenopathy. Lungs clear. Heart regular rate and rhythm. Loud murmur heard.  Abdomen is soft. Bowel sounds are positive. No hepatomegaly. No abdominal masses felt. Abdomen is not tense.  No tenderness. 3-4 + edema to rt lower extremities, 1+ to left lower extremity.  Rt extermity reddened. Ecchymosis to her left and rt arm.        Assessment & Plan:  Alcoholic cirrhosis complicated by ascites. She is not getting better. Hepatic function, CBC with diff, ammonia level. OV in 6 weeks. Metolazone  today and one on Friday.  Call with a weight check once a week.

## 2014-12-14 NOTE — Patient Instructions (Signed)
OV in 6 weeks. Metolazone  today and Friday. Weight check once a week.

## 2014-12-15 LAB — CBC WITH DIFFERENTIAL/PLATELET
BASOS ABS: 0 10*3/uL (ref 0.0–0.1)
Basophils Relative: 0 % (ref 0–1)
EOS ABS: 0.2 10*3/uL (ref 0.0–0.7)
Eosinophils Relative: 2 % (ref 0–5)
HCT: 37.2 % (ref 36.0–46.0)
Hemoglobin: 13.1 g/dL (ref 12.0–15.0)
LYMPHS ABS: 1.4 10*3/uL (ref 0.7–4.0)
Lymphocytes Relative: 19 % (ref 12–46)
MCH: 33.4 pg (ref 26.0–34.0)
MCHC: 35.2 g/dL (ref 30.0–36.0)
MCV: 94.9 fL (ref 78.0–100.0)
MPV: 9.6 fL (ref 8.6–12.4)
Monocytes Absolute: 0.8 10*3/uL (ref 0.1–1.0)
Monocytes Relative: 10 % (ref 3–12)
Neutro Abs: 5.2 10*3/uL (ref 1.7–7.7)
Neutrophils Relative %: 69 % (ref 43–77)
PLATELETS: 69 10*3/uL — AB (ref 150–400)
RBC: 3.92 MIL/uL (ref 3.87–5.11)
RDW: 14.5 % (ref 11.5–15.5)
WBC: 7.5 10*3/uL (ref 4.0–10.5)

## 2014-12-15 LAB — HEPATIC FUNCTION PANEL
ALT: 33 U/L — AB (ref 6–29)
AST: 53 U/L — ABNORMAL HIGH (ref 10–35)
Albumin: 2.3 g/dL — ABNORMAL LOW (ref 3.6–5.1)
Alkaline Phosphatase: 179 U/L — ABNORMAL HIGH (ref 33–130)
BILIRUBIN INDIRECT: 7.2 mg/dL — AB (ref 0.2–1.2)
Bilirubin, Direct: 4.2 mg/dL — ABNORMAL HIGH (ref <=0.2)
TOTAL PROTEIN: 6.3 g/dL (ref 6.1–8.1)
Total Bilirubin: 11.4 mg/dL — ABNORMAL HIGH (ref 0.2–1.2)

## 2014-12-15 LAB — AMMONIA: AMMONIA: 52 umol/L (ref 16–53)

## 2014-12-20 ENCOUNTER — Telehealth (INDEPENDENT_AMBULATORY_CARE_PROVIDER_SITE_OTHER): Payer: Self-pay | Admitting: *Deleted

## 2014-12-20 ENCOUNTER — Encounter (INDEPENDENT_AMBULATORY_CARE_PROVIDER_SITE_OTHER): Payer: Self-pay | Admitting: *Deleted

## 2014-12-20 DIAGNOSIS — K7031 Alcoholic cirrhosis of liver with ascites: Secondary | ICD-10-CM

## 2014-12-20 NOTE — Telephone Encounter (Signed)
.  Per Delrae Rend patient will have need to have lab in 2 weeks.

## 2014-12-23 ENCOUNTER — Other Ambulatory Visit (INDEPENDENT_AMBULATORY_CARE_PROVIDER_SITE_OTHER): Payer: Self-pay | Admitting: Internal Medicine

## 2014-12-23 ENCOUNTER — Telehealth (INDEPENDENT_AMBULATORY_CARE_PROVIDER_SITE_OTHER): Payer: Self-pay | Admitting: *Deleted

## 2014-12-23 DIAGNOSIS — K7031 Alcoholic cirrhosis of liver with ascites: Secondary | ICD-10-CM

## 2014-12-23 LAB — COMPREHENSIVE METABOLIC PANEL
ALBUMIN: 2.4 g/dL — AB (ref 3.6–5.1)
ALT: 40 U/L — ABNORMAL HIGH (ref 6–29)
AST: 67 U/L — ABNORMAL HIGH (ref 10–35)
Alkaline Phosphatase: 147 U/L — ABNORMAL HIGH (ref 33–130)
BUN: 25 mg/dL (ref 7–25)
CALCIUM: 9.2 mg/dL (ref 8.6–10.4)
CHLORIDE: 90 mmol/L — AB (ref 98–110)
CO2: 30 mmol/L (ref 20–31)
CREATININE: 1.18 mg/dL — AB (ref 0.50–1.05)
Glucose, Bld: 152 mg/dL — ABNORMAL HIGH (ref 65–99)
POTASSIUM: 3.7 mmol/L (ref 3.5–5.3)
SODIUM: 126 mmol/L — AB (ref 135–146)
Total Bilirubin: 15 mg/dL — ABNORMAL HIGH (ref 0.2–1.2)
Total Protein: 6.5 g/dL (ref 6.1–8.1)

## 2014-12-23 NOTE — Telephone Encounter (Signed)
Marissa Figueroa needs Terri to call her back. She has been in the bed for 3 days, no energy and yellow again. The return phone number is [phone].

## 2014-12-23 NOTE — Telephone Encounter (Signed)
addressed

## 2014-12-23 NOTE — Telephone Encounter (Signed)
CBC, CMET, and ammonia. Patient called and stated she hd been in bed for 3 days. Lethargic with some confusion. I advised her if she had confusion to go the ED. She says she is yellow. Her bilirubin has been climbing. Her weight was 216 todya

## 2014-12-24 LAB — CBC WITH DIFFERENTIAL/PLATELET
BASOS PCT: 1 % (ref 0–1)
Basophils Absolute: 0.1 10*3/uL (ref 0.0–0.1)
EOS ABS: 0.2 10*3/uL (ref 0.0–0.7)
EOS PCT: 2 % (ref 0–5)
HCT: 39.3 % (ref 36.0–46.0)
Hemoglobin: 14.2 g/dL (ref 12.0–15.0)
Lymphocytes Relative: 20 % (ref 12–46)
Lymphs Abs: 1.9 10*3/uL (ref 0.7–4.0)
MCH: 33.6 pg (ref 26.0–34.0)
MCHC: 36.1 g/dL — AB (ref 30.0–36.0)
MCV: 92.9 fL (ref 78.0–100.0)
MONO ABS: 1.3 10*3/uL — AB (ref 0.1–1.0)
MPV: 10.2 fL (ref 8.6–12.4)
Monocytes Relative: 14 % — ABNORMAL HIGH (ref 3–12)
Neutro Abs: 6 10*3/uL (ref 1.7–7.7)
Neutrophils Relative %: 63 % (ref 43–77)
Platelets: 66 10*3/uL — ABNORMAL LOW (ref 150–400)
RBC: 4.23 MIL/uL (ref 3.87–5.11)
RDW: 14.2 % (ref 11.5–15.5)
WBC: 9.6 10*3/uL (ref 4.0–10.5)

## 2014-12-24 LAB — AMMONIA: AMMONIA: 79 umol/L — AB (ref 16–53)

## 2014-12-26 ENCOUNTER — Observation Stay (HOSPITAL_COMMUNITY)
Admission: EM | Admit: 2014-12-26 | Discharge: 2014-12-27 | Disposition: A | Discharge status: Home / Self Care | Payer: Medicare Other | Attending: Internal Medicine | Admitting: Internal Medicine

## 2014-12-26 ENCOUNTER — Encounter (HOSPITAL_COMMUNITY): Payer: Self-pay | Admitting: Emergency Medicine

## 2014-12-26 DIAGNOSIS — Z79899 Other long term (current) drug therapy: Secondary | ICD-10-CM | POA: Insufficient documentation

## 2014-12-26 DIAGNOSIS — E871 Hypo-osmolality and hyponatremia: Principal | ICD-10-CM | POA: Insufficient documentation

## 2014-12-26 DIAGNOSIS — D696 Thrombocytopenia, unspecified: Secondary | ICD-10-CM | POA: Diagnosis present

## 2014-12-26 DIAGNOSIS — K7031 Alcoholic cirrhosis of liver with ascites: Secondary | ICD-10-CM | POA: Diagnosis present

## 2014-12-26 DIAGNOSIS — K7460 Unspecified cirrhosis of liver: Secondary | ICD-10-CM | POA: Insufficient documentation

## 2014-12-26 DIAGNOSIS — Z720 Tobacco use: Secondary | ICD-10-CM | POA: Insufficient documentation

## 2014-12-26 DIAGNOSIS — R531 Weakness: Secondary | ICD-10-CM | POA: Diagnosis not present

## 2014-12-26 DIAGNOSIS — I509 Heart failure, unspecified: Secondary | ICD-10-CM | POA: Diagnosis not present

## 2014-12-26 DIAGNOSIS — R7989 Other specified abnormal findings of blood chemistry: Secondary | ICD-10-CM | POA: Diagnosis present

## 2014-12-26 LAB — URINALYSIS, ROUTINE W REFLEX MICROSCOPIC
GLUCOSE, UA: NEGATIVE mg/dL
LEUKOCYTES UA: NEGATIVE
Nitrite: POSITIVE — AB
PH: 6 (ref 5.0–8.0)
PROTEIN: NEGATIVE mg/dL
SPECIFIC GRAVITY, URINE: 1.025 (ref 1.005–1.030)
Urobilinogen, UA: 1 mg/dL (ref 0.0–1.0)

## 2014-12-26 LAB — CBC WITH DIFFERENTIAL/PLATELET
BASOS ABS: 0.1 10*3/uL (ref 0.0–0.1)
Basophils Relative: 1 % (ref 0–1)
EOS PCT: 3 % (ref 0–5)
Eosinophils Absolute: 0.3 10*3/uL (ref 0.0–0.7)
HEMATOCRIT: 40.6 % (ref 36.0–46.0)
HEMOGLOBIN: 14 g/dL (ref 12.0–15.0)
LYMPHS ABS: 2.9 10*3/uL (ref 0.7–4.0)
LYMPHS PCT: 28 % (ref 12–46)
MCH: 34.3 pg — AB (ref 26.0–34.0)
MCHC: 34.5 g/dL (ref 30.0–36.0)
MCV: 99.5 fL (ref 78.0–100.0)
MONO ABS: 1.1 10*3/uL — AB (ref 0.1–1.0)
Monocytes Relative: 10 % (ref 3–12)
Neutro Abs: 5.9 10*3/uL (ref 1.7–7.7)
Neutrophils Relative %: 58 % (ref 43–77)
Platelets: 70 10*3/uL — ABNORMAL LOW (ref 150–400)
RBC: 4.08 MIL/uL (ref 3.87–5.11)
RDW: 15.1 % (ref 11.5–15.5)
Smear Review: DECREASED
WBC: 10.2 10*3/uL (ref 4.0–10.5)

## 2014-12-26 LAB — COMPREHENSIVE METABOLIC PANEL
ALBUMIN: 2.3 g/dL — AB (ref 3.5–5.0)
ALT: 53 U/L (ref 14–54)
AST: 78 U/L — AB (ref 15–41)
Alkaline Phosphatase: 214 U/L — ABNORMAL HIGH (ref 38–126)
Anion gap: 6 (ref 5–15)
BILIRUBIN TOTAL: 9.1 mg/dL — AB (ref 0.3–1.2)
BUN: 16 mg/dL (ref 6–20)
CO2: 30 mmol/L (ref 22–32)
Calcium: 8.7 mg/dL — ABNORMAL LOW (ref 8.9–10.3)
Chloride: 92 mmol/L — ABNORMAL LOW (ref 101–111)
Creatinine, Ser: 0.74 mg/dL (ref 0.44–1.00)
GFR calc Af Amer: >60 mL/min (ref >60)
GFR calc non Af Amer: >60 mL/min (ref >60)
GLUCOSE: 163 mg/dL — AB (ref 65–99)
POTASSIUM: 4.1 mmol/L (ref 3.5–5.1)
Sodium: 128 mmol/L — ABNORMAL LOW (ref 135–145)
TOTAL PROTEIN: 6.9 g/dL (ref 6.5–8.1)

## 2014-12-26 LAB — URINE MICROSCOPIC-ADD ON

## 2014-12-26 LAB — AMMONIA: Ammonia: 74 umol/L — ABNORMAL HIGH (ref 9–35)

## 2014-12-26 LAB — PROTIME-INR
INR: 2.77 — ABNORMAL HIGH (ref 0.00–1.49)
Prothrombin Time: 28.8 seconds — ABNORMAL HIGH (ref 11.6–15.2)

## 2014-12-26 LAB — APTT: APTT: 47 s — AB (ref 24–37)

## 2014-12-26 MED ORDER — CEFTRIAXONE SODIUM 1 G IJ SOLR
1.0000 g | Freq: Once | INTRAMUSCULAR | Status: AC
  Administered 2014-12-26: 1 g via INTRAVENOUS
  Filled 2014-12-26: qty 10

## 2014-12-26 NOTE — ED Notes (Signed)
Pt called x 2 for Triage.

## 2014-12-26 NOTE — ED Provider Notes (Signed)
CSN: [id number]     Arrival date & time 12/26/14  1547 History   First MD Initiated Contact with Patient 12/26/14 2015   Chief Complaint  Patient presents with  . Abnormal Lab    ammonia high and kcl low     (Consider location/radiation/quality/duration/timing/severity/associated sxs/prior Treatment) HPI patient has a history of alcoholic cirrhosis. She states she's has been sober since April. She was seen on September 9 by her GI doctor and had lab work done. She was called today and told to come to the ED to be admitted because her ammonia was high and her potassium was low. She states she feels disoriented. She denies any pain including abdominal pain. She has had some nausea and vomiting. She has some decreased appetite and states her urine has been very dark. She denies any diarrhea. She states she had 2 bowel movements yesterday but none today. She states she has been taking her lactulose. She states over the weekend she had abdominal swelling and swelling in her legs however she reports she has lost 20 pounds in the last month. Patient does not drive and states her boyfriend drove her to the ED.  PCP Dr Woody Seller in [place] Dr Laural Golden  Past Medical History  Diagnosis Date  . Cirrhosis     ALCOHOLIC  . Alcoholic cirrhosis of liver   . CHF (congestive heart failure)   . Shortness of breath     exertion   Past Surgical History  Procedure Laterality Date  . Tubal ligation    . Tonsilectomy, adenoidectomy, bilateral myringotomy and tubes    . Paracentesis  07/06/08  . Paracentesis  05/25/2008  . Esophagogastroduodenoscopy (egd) with propofol N/A 08/15/2014    Procedure: ESOPHAGOGASTRODUODENOSCOPY (EGD) WITH PROPOFOL;  Surgeon: Rogene Houston, MD;  Location: [facility];  Service: Endoscopy;  Laterality: N/A;   History reviewed. No pertinent family history. Social History  Substance Use Topics  . Smoking status: Current Some Day Smoker -- 0.25 packs/day for 35 years    Types: Cigarettes  .  Smokeless tobacco: Never Used     Comment: Smokes 4 cigarettes per day  . 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     Allergies  Ultram  Home Medications   Prior to Admission medications   Medication Sig Start Date End Date Taking? Authorizing Provider  B Complex-Biotin-FA (B-COMPLEX PO) Take 1 tablet by mouth daily.    Historical Provider, MD  Calcium Carbonate-Vitamin D (CALCIUM 600+D) 600-400 MG-UNIT per tablet Take 1 tablet by mouth 2 (two) times daily.    Historical Provider, MD  ENSURE (ENSURE) Take 1 Can by mouth daily.     Historical Provider, MD  folic acid (FOLVITE) 1 MG tablet Take 1 tablet (1 mg total) by mouth daily. 10/20/14   Butch Penny, NP  furosemide (LASIX) 40 MG tablet Take 1 tablet (40 mg total) by mouth daily. 10/25/14   Butch Penny, NP  lactulose (CHRONULAC) 10 GM/15ML solution Take 15 mLs (10 g total) by mouth 2 (two) times daily. Patient taking differently: Take 30 g by mouth 3 (three) times daily.  02/16/13   Rogene Houston, MD  metolazone (ZAROXOLYN) 5 MG tablet Take 1 tablet (5 mg total) by mouth daily. 12/14/14   Butch Penny, NP  Multiple Vitamin (MULTIVITAMIN WITH  MINERALS) TABS tablet Take 1 tablet by mouth daily. 08/18/14   Erline Hau, MD  pantoprazole (PROTONIX) 40 MG tablet Take 1 tablet (40 mg total) by mouth daily before breakfast. 10/20/14   Butch Penny, NP  potassium chloride (KLOR-CON) 20 MEQ packet Take 20 mEq by mouth 2 (two) times daily. Patient states that she takes 3 by mouth twice a day    Historical Provider, MD  potassium chloride SA (KLOR-CON M20) 20 MEQ tablet Take 3 tablets (60 mEq total) by mouth 2 (two) times daily. 10/20/14   Butch Penny, NP  spironolactone (ALDACTONE) 25 MG tablet Take 1 tablet (25 mg total) by mouth 2 (two) times daily. Patient taking differently: Take 100 mg by mouth  daily.  10/20/14   Terri L Setzer, NP   BP 107/49 mmHg  Pulse 89  Temp(Src) 98 F (36.7 C) (Oral)  Resp 16  Ht $R'5\' 8"'qW$  (1.727 m)  Wt 210 lb 9.6 oz (95.528 kg)  BMI 32.03 kg/m2  SpO2 100%  Vital signs normal   Physical Exam  Constitutional: She is oriented to person, place, and time. She appears well-developed and well-nourished.  Non-toxic appearance. She does not appear ill. No distress.  HENT:  Head: Normocephalic and atraumatic.  Right Ear: External ear normal.  Left Ear: External ear normal.  Nose: Nose normal. No mucosal edema or rhinorrhea.  Mouth/Throat: Mucous membranes are normal. No dental abscesses or uvula swelling.  Tongue is dry and red  Eyes: Conjunctivae and EOM are normal. Pupils are equal, round, and reactive to light.  Neck: Normal range of motion and full passive range of motion without pain. Neck supple.  Cardiovascular: Normal rate, regular rhythm and normal heart sounds.  Exam reveals no gallop and no friction rub.   No murmur heard. Pulmonary/Chest: Effort normal and breath sounds normal. No respiratory distress. She has no wheezes. She has no rhonchi. She has no rales. She exhibits no tenderness and no crepitus.  Abdominal: Soft. Normal appearance and bowel sounds are normal. She exhibits no distension. There is no tenderness. There is no rebound and no guarding.  Musculoskeletal: Normal range of motion. She exhibits edema. She exhibits no tenderness.  Moves all extremities well. Patient is noted to have hyperpigmentation of her extremities distally both upper and lower. She is also noted to have 1+ pitting edema of her lower extremities.  Neurological: She is alert and oriented to person, place, and time. She has normal strength. No cranial nerve deficit.  Skin: Skin is warm, dry and intact. No rash noted. No erythema. No pallor.  Psychiatric: She has a normal mood and affect. Her speech is normal and behavior is normal. Her mood appears not anxious.  Nursing  note and vitals reviewed.   ED Course  Procedures (including critical care time)  Medications  cefTRIAXone (ROCEPHIN) 1 g in dextrose 5 % 50 mL IVPB (not administered)     Pt discussed with Dr Hartford Poli at 22:14, he feels since her labs are improving she could be discharged home to increase her lactulose so she has 3 BM's a day.  23:45 pt informed that she could be discharged. She states she cannot go home because she "needs potassium and my ammonia to go down". She was informed her potassium was normal and her ammonia level was actually improving. Patient however is adamant she cannot go home tonight.  2305 Dr. Hartford Poli informed patient does not want to go home. He states to admit to observation  and he will see later in the hospital.  Urine culture was sent and patient was given Rocephin 1 g IV.  Labs Review  Results for orders placed or performed during the hospital encounter of 12/26/14  Comprehensive metabolic panel  Result Value Ref Range   Sodium 128 (L) 135 - 145 mmol/L   Potassium 4.1 3.5 - 5.1 mmol/L   Chloride 92 (L) 101 - 111 mmol/L   CO2 30 22 - 32 mmol/L   Glucose, Bld 163 (H) 65 - 99 mg/dL   BUN 16 6 - 20 mg/dL   Creatinine, Ser 0.74 0.44 - 1.00 mg/dL   Calcium 8.7 (L) 8.9 - 10.3 mg/dL   Total Protein 6.9 6.5 - 8.1 g/dL   Albumin 2.3 (L) 3.5 - 5.0 g/dL   AST 78 (H) 15 - 41 U/L   ALT 53 14 - 54 U/L   Alkaline Phosphatase 214 (H) 38 - 126 U/L   Total Bilirubin 9.1 (H) 0.3 - 1.2 mg/dL   GFR calc non Af Amer >60 >60 mL/min   GFR calc Af Amer >60 >60 mL/min   Anion gap 6 5 - 15  CBC with Differential  Result Value Ref Range   WBC 10.2 4.0 - 10.5 K/uL   RBC 4.08 3.87 - 5.11 MIL/uL   Hemoglobin 14.0 12.0 - 15.0 g/dL   HCT 40.6 36.0 - 46.0 %   MCV 99.5 78.0 - 100.0 fL   MCH 34.3 (H) 26.0 - 34.0 pg   MCHC 34.5 30.0 - 36.0 g/dL   RDW 15.1 11.5 - 15.5 %   Platelets 70 (L) 150 - 400 K/uL   Neutrophils Relative % 58 43 - 77 %   Neutro Abs 5.9 1.7 - 7.7 K/uL    Lymphocytes Relative 28 12 - 46 %   Lymphs Abs 2.9 0.7 - 4.0 K/uL   Monocytes Relative 10 3 - 12 %   Monocytes Absolute 1.1 (H) 0.1 - 1.0 K/uL   Eosinophils Relative 3 0 - 5 %   Eosinophils Absolute 0.3 0.0 - 0.7 K/uL   Basophils Relative 1 0 - 1 %   Basophils Absolute 0.1 0.0 - 0.1 K/uL   RBC Morphology ELLIPTOCYTES    Smear Review PLATELETS APPEAR DECREASED   Ammonia  Result Value Ref Range   Ammonia 74 (H) 9 - 35 umol/L  Urinalysis, Routine w reflex microscopic  Result Value Ref Range   Color, Urine AMBER (A) YELLOW   APPearance CLEAR CLEAR   Specific Gravity, Urine 1.025 1.005 - 1.030   pH 6.0 5.0 - 8.0   Glucose, UA NEGATIVE NEGATIVE mg/dL   Hgb urine dipstick SMALL (A) NEGATIVE   Bilirubin Urine MODERATE (A) NEGATIVE   Ketones, ur TRACE (A) NEGATIVE mg/dL   Protein, ur NEGATIVE NEGATIVE mg/dL   Urobilinogen, UA 1.0 0.0 - 1.0 mg/dL   Nitrite POSITIVE (A) NEGATIVE   Leukocytes, UA NEGATIVE NEGATIVE  Protime-INR  Result Value Ref Range   Prothrombin Time 28.8 (H) 11.6 - 15.2 seconds   INR 2.77 (H) 0.00 - 1.49  APTT  Result Value Ref Range   aPTT 47 (H) 24 - 37 seconds  Urine microscopic-add on  Result Value Ref Range   Squamous Epithelial / LPF FEW (A) RARE   WBC, UA 3-6 <3 WBC/hpf   RBC / HPF 3-6 <3 RBC/hpf   Bacteria, UA MANY (A) RARE   Laboratory interpretation all normal except hyponatremia which is improved from 3 days ago, elevation of her  ammonia which is slightly improved from last week in improved from her highest of 106, prolongation of the INR and PTT consistent with her history of liver disease, improved elevation of her total bilirubin down from 15 last week, possible UTI    Results for orders placed or performed in visit on 12/23/14  Comprehensive Metabolic Panel (CMET)  Result Value Ref Range   Sodium 126 (L) 135 - 146 mmol/L   Potassium 3.7 3.5 - 5.3 mmol/L   Chloride 90 (L) 98 - 110 mmol/L   CO2 30 20 - 31 mmol/L   Glucose, Bld 152 (H) 65 - 99  mg/dL   BUN 25 7 - 25 mg/dL   Creat 1.18 (H) 0.50 - 1.05 mg/dL   Total Bilirubin 15.0 (H) 0.2 - 1.2 mg/dL   Alkaline Phosphatase 147 (H) 33 - 130 U/L   AST 67 (H) 10 - 35 U/L   ALT 40 (H) 6 - 29 U/L   Total Protein 6.5 6.1 - 8.1 g/dL   Albumin 2.4 (L) 3.6 - 5.1 g/dL   Calcium 9.2 8.6 - 10.4 mg/dL  CBC with Differential/Platelet  Result Value Ref Range   WBC 9.6 4.0 - 10.5 K/uL   RBC 4.23 3.87 - 5.11 MIL/uL   Hemoglobin 14.2 12.0 - 15.0 g/dL   HCT 39.3 36.0 - 46.0 %   MCV 92.9 78.0 - 100.0 fL   MCH 33.6 26.0 - 34.0 pg   MCHC 36.1 (H) 30.0 - 36.0 g/dL   RDW 14.2 11.5 - 15.5 %   Platelets 66 (L) 150 - 400 K/uL   MPV 10.2 8.6 - 12.4 fL   Neutrophils Relative % 63 43 - 77 %   Neutro Abs 6.0 1.7 - 7.7 K/uL   Lymphocytes Relative 20 12 - 46 %   Lymphs Abs 1.9 0.7 - 4.0 K/uL   Monocytes Relative 14 (H) 3 - 12 %   Monocytes Absolute 1.3 (H) 0.1 - 1.0 K/uL   Eosinophils Relative 2 0 - 5 %   Eosinophils Absolute 0.2 0.0 - 0.7 K/uL   Basophils Relative 1 0 - 1 %   Basophils Absolute 0.1 0.0 - 0.1 K/uL   Smear Review Criteria for review not met   AMMONIA  Result Value Ref Range   Ammonia 79 (H) 16 - 53 umol/L    Imaging Review No results found. I have personally reviewed and evaluated these images and lab results as part of my medical decision-making.   EKG Interpretation None      MDM   Final diagnoses:  Hyponatremia  Alcoholic cirrhosis of liver with ascites  Weakness    Plan admission to observation   Rolland Porter, MD, Barbette Or, MD 12/26/14 2328

## 2014-12-26 NOTE — ED Notes (Signed)
Pt has abnormal blood work results called too her today - Ammonia  Level high and KCl low per pt

## 2014-12-27 ENCOUNTER — Encounter (HOSPITAL_COMMUNITY): Payer: Self-pay

## 2014-12-27 DIAGNOSIS — K7031 Alcoholic cirrhosis of liver with ascites: Secondary | ICD-10-CM | POA: Diagnosis not present

## 2014-12-27 DIAGNOSIS — E871 Hypo-osmolality and hyponatremia: Secondary | ICD-10-CM | POA: Diagnosis not present

## 2014-12-27 DIAGNOSIS — D696 Thrombocytopenia, unspecified: Secondary | ICD-10-CM

## 2014-12-27 DIAGNOSIS — R531 Weakness: Secondary | ICD-10-CM

## 2014-12-27 LAB — CBC
HCT: 35.7 % — ABNORMAL LOW (ref 36.0–46.0)
HEMOGLOBIN: 12.4 g/dL (ref 12.0–15.0)
MCH: 34.5 pg — ABNORMAL HIGH (ref 26.0–34.0)
MCHC: 34.7 g/dL (ref 30.0–36.0)
MCV: 99.4 fL (ref 78.0–100.0)
PLATELETS: 55 10*3/uL — AB (ref 150–400)
RBC: 3.59 MIL/uL — AB (ref 3.87–5.11)
RDW: 15.1 % (ref 11.5–15.5)
WBC: 8.1 10*3/uL (ref 4.0–10.5)

## 2014-12-27 LAB — BASIC METABOLIC PANEL
ANION GAP: 6 (ref 5–15)
BUN: 15 mg/dL (ref 6–20)
CALCIUM: 8.2 mg/dL — AB (ref 8.9–10.3)
CO2: 29 mmol/L (ref 22–32)
CREATININE: 0.74 mg/dL (ref 0.44–1.00)
Chloride: 96 mmol/L — ABNORMAL LOW (ref 101–111)
GFR calc non Af Amer: >60 mL/min (ref >60)
Glucose, Bld: 129 mg/dL — ABNORMAL HIGH (ref 65–99)
Potassium: 4 mmol/L (ref 3.5–5.1)
SODIUM: 131 mmol/L — AB (ref 135–145)

## 2014-12-27 LAB — TSH: TSH: 2.505 u[IU]/mL (ref 0.350–4.500)

## 2014-12-27 MED ORDER — VITAMIN D 1000 UNITS PO TABS
1000.0000 [IU] | ORAL_TABLET | Freq: Every day | ORAL | Status: DC
  Administered 2014-12-27: 1000 [IU] via ORAL
  Filled 2014-12-27 (×3): qty 1

## 2014-12-27 MED ORDER — LACTULOSE 10 GM/15ML PO SOLN
30.0000 g | Freq: Three times a day (TID) | ORAL | Status: DC
  Administered 2014-12-27: 30 g via ORAL
  Filled 2014-12-27: qty 60

## 2014-12-27 MED ORDER — RIFAXIMIN 550 MG PO TABS: 550.0000 mg | ORAL_TABLET | Freq: Two times a day (BID) | ORAL | Status: DC

## 2014-12-27 MED ORDER — FOLIC ACID 1 MG PO TABS
1.0000 mg | ORAL_TABLET | Freq: Every day | ORAL | Status: DC
  Administered 2014-12-27: 1 mg via ORAL
  Filled 2014-12-27: qty 1

## 2014-12-27 MED ORDER — ONDANSETRON HCL 4 MG/2ML IJ SOLN: 4.0000 mg | Freq: Four times a day (QID) | INTRAMUSCULAR | Status: DC | PRN

## 2014-12-27 MED ORDER — PANTOPRAZOLE SODIUM 40 MG PO TBEC
40.0000 mg | DELAYED_RELEASE_TABLET | Freq: Every day | ORAL | Status: DC
  Administered 2014-12-27: 40 mg via ORAL
  Filled 2014-12-27: qty 1

## 2014-12-27 MED ORDER — CIPROFLOXACIN HCL 250 MG PO TABS: 250.0000 mg | ORAL_TABLET | Freq: Two times a day (BID) | ORAL | Status: DC

## 2014-12-27 MED ORDER — ONDANSETRON HCL 4 MG PO TABS: 4.0000 mg | ORAL_TABLET | Freq: Four times a day (QID) | ORAL | Status: DC | PRN

## 2014-12-27 MED ORDER — SPIRONOLACTONE 100 MG PO TABS
100.0000 mg | ORAL_TABLET | Freq: Every day | ORAL | Status: DC
  Administered 2014-12-27: 100 mg via ORAL
  Filled 2014-12-27: qty 1

## 2014-12-27 MED ORDER — FUROSEMIDE 40 MG PO TABS
40.0000 mg | ORAL_TABLET | Freq: Every day | ORAL | Status: DC
  Administered 2014-12-27: 40 mg via ORAL
  Filled 2014-12-27: qty 1

## 2014-12-27 MED ORDER — ADULT MULTIVITAMIN W/MINERALS CH
1.0000 | ORAL_TABLET | Freq: Every day | ORAL | Status: DC
  Administered 2014-12-27: 1 via ORAL
  Filled 2014-12-27: qty 1

## 2014-12-27 MED ORDER — SODIUM CHLORIDE 0.9 % IV SOLN
INTRAVENOUS | Status: DC
  Administered 2014-12-27 (×2): via INTRAVENOUS

## 2014-12-27 MED ORDER — LACTULOSE 10 GM/15ML PO SOLN: 30.0000 g | Freq: Three times a day (TID) | ORAL | Status: AC

## 2014-12-27 MED ORDER — MORPHINE SULFATE (PF) 2 MG/ML IV SOLN: 1.0000 mg | INTRAVENOUS | Status: DC | PRN

## 2014-12-27 MED ORDER — VITAMIN B-1 100 MG PO TABS
250.0000 mg | ORAL_TABLET | Freq: Every day | ORAL | Status: DC
  Administered 2014-12-27: 250 mg via ORAL
  Filled 2014-12-27: qty 3

## 2014-12-27 MED ORDER — RIFAXIMIN 550 MG PO TABS
550.0000 mg | ORAL_TABLET | Freq: Two times a day (BID) | ORAL | Status: DC
  Administered 2014-12-27 (×2): 550 mg via ORAL
  Filled 2014-12-27 (×2): qty 1

## 2014-12-27 NOTE — Progress Notes (Signed)
Discharge instructions,given on medications,and follow up visits,patient,and family verbalized understanding. Prescription sent with patient. No c/o pain or discomfort noted at this time. Accompanied by staff to an awaiting vehicle.

## 2014-12-27 NOTE — Care Management Note (Signed)
Case Management Note  Patient Details  Name: Marissa Figueroa MRN: [id number] Date of Birth: 1955-11-18  Expected Discharge Date:   12/27/2014               Expected Discharge Plan:  Home w Home Health Services  In-House Referral:  NA  Discharge planning Services  CM Consult  Post Acute Care Choice:  Home Health Choice offered to:  Patient  DME Arranged:    DME Agency:     HH Arranged:  RN, PT HH Agency:  [facility]  Status of Service:  Completed, signed off  Medicare Important Message Given:    Date Medicare IM Given:    Medicare IM give by:    Date Additional Medicare IM Given:    Additional Medicare Important Message give by:     If discussed at Long Length of Stay Meetings, dates discussed:    Additional Comments: Pt admitted with weakness. Pt is from home and ind at baseline. Pt has been to SNF for rehab in the past year. Pt discharging home today with [facility] services. Through [facility], per pt's choice. Pt will receive RN/PT. Pt has used [facility] in the past. Alroy Bailiff, of [facility], made aware of referral and will obtain pt info from chart. Pt aware HH has 48 hours to make their first visit. No other CM needs identified.  Malcolm Metro, RN 12/27/2014, 1:30 PM

## 2014-12-27 NOTE — Discharge Summary (Signed)
Physician Discharge Summary  Marissa Figueroa:[id number] DOB: Mar 17, 1956 DOA: 12/26/2014  PCP: Ignatius Specking., MD  Admit date: 12/26/2014 Discharge date: 12/27/2014  Time spent: 45 minutes  Recommendations for Outpatient Follow-up:  -Will be discharged home today. -HH services will be arranged. -Will need to follow up with PCP in 2 weeks.   Discharge Diagnoses:  Principal Problem:   Weakness Active Problems:   Alcoholic cirrhosis of liver with ascites   Bilirubinemia   Thrombocytopenia   Discharge Condition: Stable and improved  Filed Weights   12/26/14 1707 12/27/14 0048  Weight: 95.528 kg (210 lb 9.6 oz) 95.346 kg (210 lb 3.2 oz)    History of present illness:  As per Dr. Arthor Captain 9/13: Marissa Figueroa is a 59 y.o. female with past medical history of alcoholic cirrhosis who came into the hospital after she was instructed by her PCP to come to the ED because of abnormal labs. Patient is reported that she was not feeling well last week and she was checked by her PCP and she was called earlier today for abnormal labs, they told her she has elevated ammonia and low potassium. Apparently on the ninth blood work was done showed ammonia level of 79, ammonia today is 74, the blood work is consistent with findings of cirrhosis. In the ED blood work showed some improvement since 9/9, especially total bilirubin from 15 to 9.1, creatinine from 1.18 to 7.4 and ammonia about the same from 79 down to 74. EDP explained to the patient her lab works is better and she might be safe to go home, patient chose not to go home and she said she is not feeling well, when she asked to specify she said she felt weak. Patient admitted for observation.   Hospital Course:   Generalized Weakness -Suspect 2/2 UTI/hyponatremia/cirrhosis. -Seen by PT with recommendations for [facility].  Cirrhosis -No signs of hepatic encephalopathy despite ammonia level of 74. -Rifaximin has been added to  lactulose. -Per family, had been confused this weekend.  UTI -Will Dc on cipro for 5 days. -Cx pending at time of DC.  Hyponatremia -Na 126, up to 131 on DC with IVF.  Procedures:  None   Consultations:  None  Discharge Instructions  Discharge Instructions    Diet - low sodium heart healthy    Complete by:  As directed      Increase activity slowly    Complete by:  As directed             Medication List    STOP taking these medications        metolazone 5 MG tablet  Commonly known as:  ZAROXOLYN     pantoprazole 40 MG tablet  Commonly known as:  PROTONIX      TAKE these medications        B-1 250 MG Tabs  Take 1 tablet by mouth daily.     cholecalciferol 1000 UNITS tablet  Commonly known as:  VITAMIN D  Take 1,000 Units by mouth daily.     ENSURE  Take 1 Can by mouth daily.     folic acid 1 MG tablet  Commonly known as:  FOLVITE  Take 1 tablet (1 mg total) by mouth daily.     furosemide 40 MG tablet  Commonly known as:  LASIX  Take 1 tablet (40 mg total) by mouth daily.     lactulose 10 GM/15ML solution  Commonly known as:  CHRONULAC  Take 45 mLs (  30 g total) by mouth 3 (three) times daily.     multivitamin with minerals Tabs tablet  Take 1 tablet by mouth daily.     omeprazole 20 MG capsule  Commonly known as:  PRILOSEC  Take 20 mg by mouth 2 (two) times daily.     potassium chloride SA 20 MEQ tablet  Commonly known as:  KLOR-CON M20  Take 3 tablets (60 mEq total) by mouth 2 (two) times daily.     rifaximin 550 MG Tabs tablet  Commonly known as:  XIFAXAN  Take 1 tablet (550 mg total) by mouth 2 (two) times daily.     spironolactone 100 MG tablet  Commonly known as:  ALDACTONE  Take 100 mg by mouth daily.       Allergies  Allergen Reactions  . Ultram [Tramadol] Other (See Comments)    Headache       Follow-up Information    Follow up with VYAS,DHRUV B., MD. Schedule an appointment as soon as possible for a visit in 1 week.    Specialty:  Internal Medicine   Contact information:   [address] [place] [address] [phone]        The results of significant diagnostics from this hospitalization (including imaging, microbiology, ancillary and laboratory) are listed below for reference.    Significant Diagnostic Studies: No results found.  Microbiology: No results found for this or any previous visit (from the past 240 hour(s)).   Labs: Basic Metabolic Panel:  Recent Labs Lab 12/23/14 1056 12/26/14 2040 12/27/14 0653  NA 126* 128* 131*  K 3.7 4.1 4.0  CL 90* 92* 96*  CO2 30 30 29   GLUCOSE 152* 163* 129*  BUN 25 16 15   CREATININE 1.18* 0.74 0.74  CALCIUM 9.2 8.7* 8.2*   Liver Function Tests:  Recent Labs Lab 12/23/14 1056 12/26/14 2040  AST 67* 78*  ALT 40* 53  ALKPHOS 147* 214*  BILITOT 15.0* 9.1*  PROT 6.5 6.9  ALBUMIN 2.4* 2.3*   No results for input(s): LIPASE, AMYLASE in the last 168 hours.  Recent Labs Lab 12/26/14 2040  AMMONIA 74*   CBC:  Recent Labs Lab 12/23/14 1214 12/26/14 2040 12/27/14 0653  WBC 9.6 10.2 8.1  NEUTROABS 6.0 5.9  --   HGB 14.2 14.0 12.4  HCT 39.3 40.6 35.7*  MCV 92.9 99.5 99.4  PLT 66* 70* 55*   Cardiac Enzymes: No results for input(s): CKTOTAL, CKMB, CKMBINDEX, TROPONINI in the last 168 hours. BNP: BNP (last 3 results)  Recent Labs  08/09/14 1459  BNP 178.0*    ProBNP (last 3 results) No results for input(s): PROBNP in the last 8760 hours.  CBG: No results for input(s): GLUCAP in the last 168 hours.     SignedChaya Jan  [facility] Pager: [phone] 12/27/2014, 10:29 AM

## 2014-12-27 NOTE — Evaluation (Signed)
Physical Therapy Evaluation Patient Details Name: AVIYANA SONNTAG MRN: [id number] DOB: 03-22-1956 Today's Date: 12/27/2014   History of Present Illness  Marissa Figueroa is a 59 y.o. female with past medical history of alcoholic cirrhosis who came into the hospital after she was instructed by her PCP to come to the ED because of abnormal labs. Patient is reported that she was not feeling well last week and she was checked by her PCP and she was called earlier today for abnormal labs, they told her she has elevated ammonia and low potassium. Apparently on the ninth blood work was done showed ammonia level of 79, ammonia today is 74, the blood work is consistent with findings of cirrhosis.  Clinical Impression  Patient found supine in bed, pleasant and very talkative. She reports that she has had multiple falls at home and is not very active in general besides walking around her home, also that she has been considering a wheelchair since she always feels so weak. Her daughter is there with her to provide assistance on a regular basis. Patient able to complete bed mobility with independence and toileting with distant supervision. However she does demonstrate balance impairment along with general unsteadiness and also displays significant muscle weakness and reduced functional activity tolerance. At this time patient is most appropriate for HHPT at time of DC from this facility by MD; she will continue to receive skilled PT services during her stay here for focus on balance, strength, gait, and functional activity tolerance.     Follow Up Recommendations Home health PT    Equipment Recommendations  None recommended by PT    Recommendations for Other Services OT consult     Precautions / Restrictions Precautions Precautions: Fall Restrictions Weight Bearing Restrictions: No      Mobility  Bed Mobility Overal bed mobility: Modified Independent                Transfers Overall  transfer level: Needs assistance Equipment used: Rolling walker (2 wheeled) Transfers: Sit to/from Stand Sit to Stand: Min guard            Ambulation/Gait Ambulation/Gait assistance: Min guard Ambulation Distance (Feet):  (59ft, 76ft, approx 141ft ) Assistive device: Rolling walker (2 wheeled) Gait Pattern/deviations: Step-through pattern;Decreased dorsiflexion - right;Decreased dorsiflexion - left;Trunk flexed;Narrow base of support   Gait velocity interpretation: <1.8 ft/sec, indicative of risk for recurrent falls    Stairs            Wheelchair Mobility    Modified Rankin (Stroke Patients Only)       Balance Overall balance assessment: Needs assistance;History of Falls Sitting-balance support: No upper extremity supported Sitting balance-Leahy Scale: Good     Standing balance support: No upper extremity supported Standing balance-Leahy Scale: Fair                               Pertinent Vitals/Pain Pain Assessment: No/denies pain    Home Living Family/patient expects to be discharged to:: Private residence Living Arrangements: Children Available Help at Discharge: Family Type of Home: House Home Access: Level entry     Home Layout: One level Home Equipment: Environmental consultant - 4 wheels;Bedside commode;Shower seat      Prior Function Level of Independence: Needs assistance   Gait / Transfers Assistance Needed: per pt, she transferred and ambulated short distances with no assistive device but has been falling  ADL's / Homemaking Assistance Needed: assist with bathing  and all household activities        Hand Dominance        Extremity/Trunk Assessment   Upper Extremity Assessment: Defer to OT evaluation           Lower Extremity Assessment: Generalized weakness      Cervical / Trunk Assessment: Normal  Communication   Communication: No difficulties  Cognition Arousal/Alertness: Awake/alert Behavior During Therapy: WFL for  tasks assessed/performed Overall Cognitive Status: Within Functional Limits for tasks assessed                      General Comments      Exercises        Assessment/Plan    PT Assessment Patient needs continued PT services  PT Diagnosis Difficulty walking;Generalized weakness;Other (comment)   PT Problem List Decreased strength;Decreased coordination;Decreased activity tolerance;Decreased balance;Decreased safety awareness;Decreased mobility  PT Treatment Interventions DME instruction;Therapeutic exercise;Gait training;Balance training;Therapeutic activities;Patient/family education   PT Goals (Current goals can be found in the Care Plan section) Acute Rehab PT Goals Patient Stated Goal: to feel better, get stronger  PT Goal Formulation: With patient Time For Goal Achievement: 01/10/15 Potential to Achieve Goals: Good    Frequency Min 3X/week   Barriers to discharge        Co-evaluation               End of Session   Activity Tolerance: Patient tolerated treatment well;Patient limited by fatigue Patient left: in bed;with call bell/phone within reach      Functional Assessment Tool Used: Based on skilled clinical assessment of gait, balance, strength, functional mobility  Functional Limitation: Mobility: Walking and moving around Mobility: Walking and Moving Around Current Status (Z6109): At least 20 percent but less than 40 percent impaired, limited or restricted Mobility: Walking and Moving Around Goal Status [phone]): At least 1 percent but less than 20 percent impaired, limited or restricted    Time: 0926-0954 PT Time Calculation (min) (ACUTE ONLY): 28 min   Charges:   PT Evaluation $Initial PT Evaluation Tier I: 1 Procedure PT Treatments $Self Care/Home Management: 8-22   PT G Codes:   PT G-Codes **NOT FOR INPATIENT CLASS** Functional Assessment Tool Used: Based on skilled clinical assessment of gait, balance, strength, functional mobility   Functional Limitation: Mobility: Walking and moving around Mobility: Walking and Moving Around Current Status (U9811): At least 20 percent but less than 40 percent impaired, limited or restricted Mobility: Walking and Moving Around Goal Status [phone]): At least 1 percent but less than 20 percent impaired, limited or restricted    Nedra Hai PT, DPT [phone]

## 2014-12-27 NOTE — H&P (Signed)
[facility] History and Physical  HILLERY ZACHMAN ZOX:[id number] DOB: [birth year] DOA: 12/26/2014  Referring physician: Dr. Lynelle Doctor PCP: Ignatius Specking., MD   Chief Complaint: Abnormal labs  HPI: Marissa Figueroa is a 59 y.o. female with past medical history of alcoholic cirrhosis who came into the hospital after she was instructed by her PCP to come to the ED because of abnormal labs. Patient is reported that she was not feeling well last week and she was checked by her PCP and she was called earlier today for abnormal labs, they told her she has elevated ammonia and low potassium. Apparently on the ninth blood work was done showed ammonia level of 79, ammonia today is 74, the blood work is consistent with findings of cirrhosis. In the ED blood work showed some improvement since 9/9, especially total bilirubin from 15 to 9.1, creatinine from 1.18 to 7.4 and ammonia about the same from 79 down to 74. EDP explained to the patient her lab works is better and she might be safe to go home, patient chose not to go home and she said she is not feeling well, when she asked to specify she said she felt weak. Patient admitted for observation.   Review of Systems:  Constitutional: negative for anorexia, fevers and sweats Eyes: negative for irritation, redness and visual disturbance Ears, nose, mouth, throat, and face: negative for earaches, epistaxis, nasal congestion and sore throat Respiratory: negative for cough, dyspnea on exertion, sputum and wheezing Cardiovascular: negative for chest pain, dyspnea, lower extremity edema, orthopnea, palpitations and syncope Gastrointestinal: negative for abdominal pain, constipation, diarrhea, melena, nausea and vomiting Genitourinary:negative for dysuria, frequency and hematuria Hematologic/lymphatic: negative for bleeding, easy bruising and lymphadenopathy Musculoskeletal:negative for arthralgias, muscle weakness and stiff joints Neurological:  negative for coordination problems, gait problems, headaches and weakness Endocrine: negative for diabetic symptoms including polydipsia, polyuria and weight loss Allergic/Immunologic: negative for anaphylaxis, hay fever and urticaria  Past Medical History  Diagnosis Date  . Cirrhosis     ALCOHOLIC  . Alcoholic cirrhosis of liver   . CHF (congestive heart failure)   . Shortness of breath     exertion   Past Surgical History  Procedure Laterality Date  . Tubal ligation    . Tonsilectomy, adenoidectomy, bilateral myringotomy and tubes    . Paracentesis  07/06/08  . Paracentesis  05/25/2008  . Esophagogastroduodenoscopy (egd) with propofol N/A 08/15/2014    Procedure: ESOPHAGOGASTRODUODENOSCOPY (EGD) WITH PROPOFOL;  Surgeon: Malissa Hippo, MD;  Location: [facility];  Service: Endoscopy;  Laterality: N/A;   Social History:   reports that she has been smoking Cigarettes.  She has a 8.75 pack-year smoking history. She has never used smokeless tobacco. She reports that she does not drink alcohol or use illicit drugs.  Allergies  Allergen Reactions  . Ultram [Tramadol] Other (See Comments)    Headache    Family history: No history of colon cancer.  Prior to Admission medications   Medication Sig Start Date End Date Taking? Authorizing Provider  cholecalciferol (VITAMIN D) 1000 UNITS tablet Take 1,000 Units by mouth daily.   Yes Historical Provider, MD  ENSURE (ENSURE) Take 1 Can by mouth daily.    Yes Historical Provider, MD  folic acid (FOLVITE) 1 MG tablet Take 1 tablet (1 mg total) by mouth daily. 10/20/14  Yes Len Blalock, NP  furosemide (LASIX) 40 MG tablet Take 1 tablet (40 mg total) by mouth daily. 10/25/14  Yes Len Blalock, NP  lactulose (CHRONULAC)  10 GM/15ML solution Take 15 mLs (10 g total) by mouth 2 (two) times daily. Patient taking differently: Take 30 g by mouth 3 (three) times daily.  02/16/13  Yes Malissa Hippo, MD  Multiple Vitamin (MULTIVITAMIN WITH MINERALS) TABS  tablet Take 1 tablet by mouth daily. 08/18/14  Yes Henderson Cloud, MD  omeprazole (PRILOSEC) 20 MG capsule Take 20 mg by mouth 2 (two) times daily.   Yes Historical Provider, MD  potassium chloride SA (KLOR-CON M20) 20 MEQ tablet Take 3 tablets (60 mEq total) by mouth 2 (two) times daily. Patient taking differently: Take 20 mEq by mouth daily.  10/20/14  Yes Len Blalock, NP  spironolactone (ALDACTONE) 100 MG tablet Take 100 mg by mouth daily.   Yes Historical Provider, MD  Thiamine HCl (B-1) 250 MG TABS Take 1 tablet by mouth daily.   Yes Historical Provider, MD  metolazone (ZAROXOLYN) 5 MG tablet Take 1 tablet (5 mg total) by mouth daily. Patient not taking: Reported on 12/26/2014 12/14/14   Len Blalock, NP  pantoprazole (PROTONIX) 40 MG tablet Take 1 tablet (40 mg total) by mouth daily before breakfast. Patient not taking: Reported on 12/26/2014 10/20/14   Len Blalock, NP  spironolactone (ALDACTONE) 25 MG tablet Take 1 tablet (25 mg total) by mouth 2 (two) times daily. Patient not taking: Reported on 12/26/2014 10/20/14   Len Blalock, NP   Physical Exam: Filed Vitals:   12/26/14 2330  BP: 95/38  Pulse: 81  Temp:   Resp: 18   Constitutional: Oriented to person, place, and time. Well-developed and well-nourished. Cooperative.  Head: Normocephalic and atraumatic.  Nose: Nose normal.  Mouth/Throat: Uvula is midline, oropharynx is clear and moist and mucous membranes are normal.  Eyes: Conjunctivae and EOM are normal. Pupils are equal, round, and reactive to light.  Neck: Trachea normal and normal range of motion. Neck supple.  Cardiovascular: Normal rate, regular rhythm, S1 normal, S2 normal, normal heart sounds and intact distal pulses.   Pulmonary/Chest: Effort normal and breath sounds normal.  Abdominal: Soft. Bowel sounds are normal. There is no hepatosplenomegaly. There is no tenderness.  Musculoskeletal: +1 pedal edema  Neurological: Alert and oriented to person, place,  and time. Has normal strength. No cranial nerve deficit or sensory deficit.  Skin: Skin is warm, dry and intact.  Psychiatric: Has a normal mood and affect. Speech is normal and behavior is normal.   Labs on Admission:  Basic Metabolic Panel:  Recent Labs Lab 12/23/14 1056 12/26/14 2040  NA 126* 128*  K 3.7 4.1  CL 90* 92*  CO2 30 30  GLUCOSE 152* 163*  BUN 25 16  CREATININE 1.18* 0.74  CALCIUM 9.2 8.7*   Liver Function Tests:  Recent Labs Lab 12/23/14 1056 12/26/14 2040  AST 67* 78*  ALT 40* 53  ALKPHOS 147* 214*  BILITOT 15.0* 9.1*  PROT 6.5 6.9  ALBUMIN 2.4* 2.3*   No results for input(s): LIPASE, AMYLASE in the last 168 hours.  Recent Labs Lab 12/26/14 2040  AMMONIA 74*   CBC:  Recent Labs Lab 12/23/14 1214 12/26/14 2040  WBC 9.6 10.2  NEUTROABS 6.0 5.9  HGB 14.2 14.0  HCT 39.3 40.6  MCV 92.9 99.5  PLT 66* 70*   Cardiac Enzymes: No results for input(s): CKTOTAL, CKMB, CKMBINDEX, TROPONINI in the last 168 hours.  BNP (last 3 results)  Recent Labs  08/09/14 1459  BNP 178.0*    ProBNP (last 3 results) No  results for input(s): PROBNP in the last 8760 hours.  CBG: No results for input(s): GLUCAP in the last 168 hours.  Radiological Exams on Admission: No results found.  EKG: EKG pending  Assessment/Plan Principal Problem:   Weakness Active Problems:   Alcoholic cirrhosis of liver with ascites   Bilirubinemia   Thrombocytopenia    Generalized weakness -Patient instructed by her GI to come to the hospital for evaluation of abnormal labs. -Patient has had elevated ammonia and creatinine. Creatinine improved since Friday. Ammonia about the same. -She does not seems to be confused or lethargic. -Instructed by the EDP to go home and take more lactulose patient declined, reported she is not feeling well.  Alcoholic cirrhosis -She has hyponatremia, transaminitis, hypoalbuminemia, hyperbilirubinemia, hyperammonemia and  thrombocytopenia. -Patient has ascites and lower extremity edema as well. -Is on Lasix and Aldactone, continued, follow renal function closely. -Total bilirubin improved from 15.0 on 9/9 to 9.1 today.  History of hepatic encephalopathy -Elevated ammonia level, history of hepatic encephalopathy. -Patient reported that she was lethargic during the weekend, currently she is awake, alert and oriented 3. -Restarted her lactulose, added rifaximin.  Questionable UTI -Urinalysis showed bacteriuria, positive nitrite and 3-6 WBC. -Started on Rocephin in the ED, continued, follow urine culture results..  Code Status: Full code Family Communication: Stable Disposition Plan: Heart healthy  Time spent: 70 minutes  Katrenia Alkins A, MD [facility] Pager [phone]

## 2014-12-28 LAB — HEMOGLOBIN A1C
HEMOGLOBIN A1C: 4.7 % — AB (ref 4.8–5.6)
Mean Plasma Glucose: 88 mg/dL

## 2014-12-29 ENCOUNTER — Telehealth (INDEPENDENT_AMBULATORY_CARE_PROVIDER_SITE_OTHER): Payer: Self-pay | Admitting: *Deleted

## 2014-12-29 NOTE — Telephone Encounter (Signed)
Marissa Figueroa said her mother has been calling Marissa Figueroa about a medication that was prescribed to her by the ED. She can not afford this medicine and was told to call her GI doctor. They would appreciate a return call at [phone].

## 2014-12-30 NOTE — Telephone Encounter (Signed)
I do not have any samples of [place]. She is to continue the Lactulose.

## 2014-12-31 LAB — URINE CULTURE: Culture: >=100000

## 2015-01-05 ENCOUNTER — Telehealth (INDEPENDENT_AMBULATORY_CARE_PROVIDER_SITE_OTHER): Payer: Self-pay | Admitting: *Deleted

## 2015-01-05 NOTE — Telephone Encounter (Signed)
Results reviewed with patient

## 2015-01-05 NOTE — Telephone Encounter (Signed)
Will see in October.

## 2015-01-05 NOTE — Telephone Encounter (Signed)
Emmely wanted to make sure Camelia Eng got a copy of the blood work from Dr. Sherril Croon' office. Would like for Terri to explain it to her. The return phone number is [phone].

## 2015-01-06 ENCOUNTER — Encounter (INDEPENDENT_AMBULATORY_CARE_PROVIDER_SITE_OTHER): Payer: Self-pay

## 2015-01-17 ENCOUNTER — Encounter (INDEPENDENT_AMBULATORY_CARE_PROVIDER_SITE_OTHER): Payer: Self-pay | Admitting: Internal Medicine

## 2015-01-17 ENCOUNTER — Ambulatory Visit (INDEPENDENT_AMBULATORY_CARE_PROVIDER_SITE_OTHER): Payer: Medicare Other | Admitting: Internal Medicine

## 2015-01-17 ENCOUNTER — Other Ambulatory Visit (INDEPENDENT_AMBULATORY_CARE_PROVIDER_SITE_OTHER): Payer: Self-pay | Admitting: Internal Medicine

## 2015-01-17 VITALS — BP 100/52 | HR 72 | Temp 98.4°F | Ht 68.0 in | Wt 245.0 lb

## 2015-01-17 DIAGNOSIS — K7030 Alcoholic cirrhosis of liver without ascites: Secondary | ICD-10-CM

## 2015-01-17 LAB — PROTIME-INR
INR: 2.71 — ABNORMAL HIGH (ref <1.50)
Prothrombin Time: 29.2 seconds — ABNORMAL HIGH (ref 11.6–15.2)

## 2015-01-17 MED ORDER — METOLAZONE 5 MG PO TABS: 5.0000 mg | ORAL_TABLET | Freq: Once | ORAL | Status: DC

## 2015-01-17 NOTE — Progress Notes (Signed)
Subjective:    Patient ID: Marissa Figueroa, female    DOB: [birth year], 59 y.o.   MRN: [id number]  HPI   HPI Here today for f/u of her alcoholic cirrhosis. She was last seen in August.  She was last seen in July and her weight was    On 12/14/2014 her weight was 231.   Today her weight 250. She has gained 19 pounds. Her appetite is okay. She snacks frequently, She eats ice.  She usually has a BM 2-4 a day. She is taking Lactulose daily.  She has had some confusion.  She states she has not been drinking. She has not drank since April.  Daughters are present in room.           She is not drinking.  01/05/2015 Ammonia 83, glucose 121, BUN 13, Creatinine, NA 1340, K 5.3, total bili 7.7, ALP 231,, AST 65, ALT 37,H and H 12.4 and 35.3, MCV 93. Platelet ct 64   Hepatic Function Latest Ref Rng 12/26/2014 12/23/2014 12/14/2014  Total Protein 6.5 - 8.1 g/dL 6.9 6.5 6.3  Albumin 3.5 - 5.0 g/dL 2.3(L) 2.4(L) 2.3(L)  AST 15 - 41 U/L 78(H) 67(H) 53(H)  ALT 14 - 54 U/L 53 40(H) 33(H)  Alk Phosphatase 38 - 126 U/L 214(H) 147(H) 179(H)  Total Bilirubin 0.3 - 1.2 mg/dL 9.1(H) 15.0(H) 11.4(H)  Bilirubin, Direct <=0.2 mg/dL - - 4.2(H)     11/04/2014 [place] limited: No ascites.    Review of Systems Past Medical History  Diagnosis Date  . Cirrhosis (HCC)     ALCOHOLIC  . Alcoholic cirrhosis of liver (HCC)   . CHF (congestive heart failure) (HCC)   . Shortness of breath     exertion    Past Surgical History  Procedure Laterality Date  . Tubal ligation    . Tonsilectomy, adenoidectomy, bilateral myringotomy and tubes    . Paracentesis  07/06/08  . Paracentesis  05/25/2008  . Esophagogastroduodenoscopy (egd) with propofol N/A 08/15/2014    Procedure: ESOPHAGOGASTRODUODENOSCOPY (EGD) WITH PROPOFOL;  Surgeon: Malissa Hippo, MD;  Location: [facility];  Service: Endoscopy;  Laterality: N/A;    Allergies  Allergen Reactions  . Ultram [Tramadol] Other (See Comments)    Headache    Current  Outpatient Prescriptions on File Prior to Visit  Medication Sig Dispense Refill  . cholecalciferol (VITAMIN D) 1000 UNITS tablet Take 1,000 Units by mouth daily.    . ciprofloxacin (CIPRO) 250 MG tablet Take 1 tablet (250 mg total) by mouth 2 (two) times daily. 10 tablet 0  . ENSURE (ENSURE) Take 1 Can by mouth daily.     . folic acid (FOLVITE) 1 MG tablet Take 1 tablet (1 mg total) by mouth daily.    . furosemide (LASIX) 40 MG tablet Take 1 tablet (40 mg total) by mouth daily. (Patient taking differently: Take 40 mg by mouth 2 (two) times daily. ) 30 tablet 3  . lactulose (CHRONULAC) 10 GM/15ML solution Take 45 mLs (30 g total) by mouth 3 (three) times daily. 240 mL 0  . Multiple Vitamin (MULTIVITAMIN WITH MINERALS) TABS tablet Take 1 tablet by mouth daily.    [name] Kitchen omeprazole (PRILOSEC) 20 MG capsule Take 20 mg by mouth 2 (two) times daily.    . potassium chloride SA (KLOR-CON M20) 20 MEQ tablet Take 3 tablets (60 mEq total) by mouth 2 (two) times daily. (Patient taking differently: Take 20 mEq by mouth daily. ) 180 tablet 3  . spironolactone (ALDACTONE)  100 MG tablet Take 100 mg by mouth daily.    . Thiamine HCl (B-1) 250 MG TABS Take 1 tablet by mouth daily.    . rifaximin (XIFAXAN) 550 MG TABS tablet Take 1 tablet (550 mg total) by mouth 2 (two) times daily. (Patient not taking: Reported on 01/17/2015) 60 tablet 2   No current facility-administered medications on file prior to visit.        Objective:   Physical Exam Blood pressure 100/52, pulse 72, temperature 98.4 F (36.9 C), height $RemoveBe'5\' 8"'sQqayOaTd$  (1.727 m), weight 245 lb (111.131 kg). Alert and oriented. Skin warm and dry. Oral mucosa is moist.   . Sclera icteric, conjunctivae is pink. Thyroid not enlarged. No cervical lymphadenopathy. Lungs clear. Heart regular rate and rhythm.  Abdomen is soft. Bowel sounds are positive. No hepatomegaly. No abdominal masses felt. No tenderness.  4+_ edema to lower extremities.  Edema from thight to  ankles.        Assessment & Plan:  Alcoholic cirrhosis. Patient is not drinking.  Her bili is slowly coming down. Will repeat a cmet, CBC, PT/INR today . CMET, PT/INR, and CBC in 4 week. OV in 4 weeks Thrombocytopenia related to her liver disease. Anasarca metolazone (ZAROXOLYN) 5 MG tablet 5 mg, Once  Summary: Take 1 tablet (5 mg total) by mouth once. $RemoveBef'5mg'HQEAzjlZBi$  today, $Remov'5mg'saGryE$  tomorrow. Then take $RemoveB'5mg'JaHvBHDo$  every 4th day as needed.,  OV in 4 weeks.

## 2015-01-17 NOTE — Patient Instructions (Addendum)
Labs today. OV 4 weeks.   OV 4 week. Come by office Friday for a weight check

## 2015-01-18 ENCOUNTER — Telehealth (INDEPENDENT_AMBULATORY_CARE_PROVIDER_SITE_OTHER): Payer: Self-pay | Admitting: *Deleted

## 2015-01-18 DIAGNOSIS — K7031 Alcoholic cirrhosis of liver with ascites: Secondary | ICD-10-CM

## 2015-01-18 LAB — CBC WITH DIFFERENTIAL/PLATELET
Basophils Absolute: 0 10*3/uL (ref 0.0–0.1)
Basophils Relative: 0 % (ref 0–1)
EOS PCT: 1 % (ref 0–5)
Eosinophils Absolute: 0.1 10*3/uL (ref 0.0–0.7)
HEMATOCRIT: 32.9 % — AB (ref 36.0–46.0)
Hemoglobin: 11.6 g/dL — ABNORMAL LOW (ref 12.0–15.0)
LYMPHS ABS: 1.6 10*3/uL (ref 0.7–4.0)
LYMPHS PCT: 16 % (ref 12–46)
MCH: 33.9 pg (ref 26.0–34.0)
MCHC: 35.3 g/dL (ref 30.0–36.0)
MCV: 96.2 fL (ref 78.0–100.0)
MONO ABS: 0.8 10*3/uL (ref 0.1–1.0)
MPV: 10.8 fL (ref 8.6–12.4)
Monocytes Relative: 8 % (ref 3–12)
Neutro Abs: 7.4 10*3/uL (ref 1.7–7.7)
Neutrophils Relative %: 75 % (ref 43–77)
Platelets: 53 10*3/uL — ABNORMAL LOW (ref 150–400)
RBC: 3.42 MIL/uL — ABNORMAL LOW (ref 3.87–5.11)
RDW: 16 % — AB (ref 11.5–15.5)
WBC: 9.9 10*3/uL (ref 4.0–10.5)

## 2015-01-18 LAB — AMMONIA: Ammonia: 65 umol/L — ABNORMAL HIGH (ref 16–53)

## 2015-01-18 NOTE — Telephone Encounter (Signed)
.  Per Delrae Rend the patient will need to have lab work in 1 month.

## 2015-01-20 LAB — COMPREHENSIVE METABOLIC PANEL
ALK PHOS: 164 U/L — AB (ref 33–130)
ALT: 37 U/L — AB (ref 6–29)
AST: 51 U/L — AB (ref 10–35)
Albumin: 2 g/dL — ABNORMAL LOW (ref 3.6–5.1)
BILIRUBIN TOTAL: 8.9 mg/dL — AB (ref 0.2–1.2)
BUN: 15 mg/dL (ref 7–25)
CALCIUM: 8.4 mg/dL — AB (ref 8.6–10.4)
CO2: 26 mmol/L (ref 20–31)
CREATININE: 0.92 mg/dL (ref 0.50–1.05)
Chloride: 97 mmol/L — ABNORMAL LOW (ref 98–110)
GLUCOSE: 143 mg/dL — AB (ref 65–99)
Potassium: 4.1 mmol/L (ref 3.5–5.3)
SODIUM: 130 mmol/L — AB (ref 135–146)
Total Protein: 5.3 g/dL — ABNORMAL LOW (ref 6.1–8.1)

## 2015-01-24 ENCOUNTER — Encounter (HOSPITAL_COMMUNITY): Payer: Self-pay | Admitting: *Deleted

## 2015-01-24 ENCOUNTER — Inpatient Hospital Stay (HOSPITAL_COMMUNITY)
Admission: EM | Admit: 2015-01-24 | Discharge: 2015-01-30 | DRG: 871 | Disposition: A | Discharge status: Home Health | Payer: Medicare Other | Attending: Internal Medicine | Admitting: Internal Medicine

## 2015-01-24 ENCOUNTER — Emergency Department (HOSPITAL_COMMUNITY): Payer: Medicare Other

## 2015-01-24 DIAGNOSIS — Z6837 Body mass index (BMI) 37.0-37.9, adult: Secondary | ICD-10-CM | POA: Diagnosis not present

## 2015-01-24 DIAGNOSIS — A411 Sepsis due to other specified staphylococcus: Principal | ICD-10-CM | POA: Diagnosis present

## 2015-01-24 DIAGNOSIS — E669 Obesity, unspecified: Secondary | ICD-10-CM | POA: Diagnosis present

## 2015-01-24 DIAGNOSIS — J189 Pneumonia, unspecified organism: Secondary | ICD-10-CM | POA: Diagnosis present

## 2015-01-24 DIAGNOSIS — R609 Edema, unspecified: Secondary | ICD-10-CM | POA: Diagnosis present

## 2015-01-24 DIAGNOSIS — A419 Sepsis, unspecified organism: Secondary | ICD-10-CM | POA: Diagnosis not present

## 2015-01-24 DIAGNOSIS — Z23 Encounter for immunization: Secondary | ICD-10-CM | POA: Diagnosis not present

## 2015-01-24 DIAGNOSIS — M25519 Pain in unspecified shoulder: Secondary | ICD-10-CM

## 2015-01-24 DIAGNOSIS — F1010 Alcohol abuse, uncomplicated: Secondary | ICD-10-CM | POA: Diagnosis not present

## 2015-01-24 DIAGNOSIS — R509 Fever, unspecified: Secondary | ICD-10-CM

## 2015-01-24 DIAGNOSIS — K7290 Hepatic failure, unspecified without coma: Secondary | ICD-10-CM | POA: Diagnosis present

## 2015-01-24 DIAGNOSIS — D689 Coagulation defect, unspecified: Secondary | ICD-10-CM | POA: Diagnosis present

## 2015-01-24 DIAGNOSIS — I272 Other secondary pulmonary hypertension: Secondary | ICD-10-CM | POA: Diagnosis present

## 2015-01-24 DIAGNOSIS — T502X5A Adverse effect of carbonic-anhydrase inhibitors, benzothiadiazides and other diuretics, initial encounter: Secondary | ICD-10-CM | POA: Diagnosis present

## 2015-01-24 DIAGNOSIS — A499 Bacterial infection, unspecified: Secondary | ICD-10-CM | POA: Diagnosis not present

## 2015-01-24 DIAGNOSIS — R7881 Bacteremia: Secondary | ICD-10-CM | POA: Diagnosis not present

## 2015-01-24 DIAGNOSIS — E860 Dehydration: Secondary | ICD-10-CM | POA: Diagnosis present

## 2015-01-24 DIAGNOSIS — D638 Anemia in other chronic diseases classified elsewhere: Secondary | ICD-10-CM | POA: Diagnosis present

## 2015-01-24 DIAGNOSIS — D696 Thrombocytopenia, unspecified: Secondary | ICD-10-CM | POA: Diagnosis present

## 2015-01-24 DIAGNOSIS — I5033 Acute on chronic diastolic (congestive) heart failure: Secondary | ICD-10-CM | POA: Diagnosis present

## 2015-01-24 DIAGNOSIS — K7030 Alcoholic cirrhosis of liver without ascites: Secondary | ICD-10-CM | POA: Diagnosis present

## 2015-01-24 DIAGNOSIS — Z7409 Other reduced mobility: Secondary | ICD-10-CM

## 2015-01-24 DIAGNOSIS — R739 Hyperglycemia, unspecified: Secondary | ICD-10-CM | POA: Diagnosis present

## 2015-01-24 DIAGNOSIS — E871 Hypo-osmolality and hyponatremia: Secondary | ICD-10-CM | POA: Diagnosis present

## 2015-01-24 DIAGNOSIS — M25511 Pain in right shoulder: Secondary | ICD-10-CM | POA: Diagnosis present

## 2015-01-24 DIAGNOSIS — E876 Hypokalemia: Secondary | ICD-10-CM | POA: Diagnosis present

## 2015-01-24 DIAGNOSIS — M19012 Primary osteoarthritis, left shoulder: Secondary | ICD-10-CM | POA: Diagnosis present

## 2015-01-24 DIAGNOSIS — E8809 Other disorders of plasma-protein metabolism, not elsewhere classified: Secondary | ICD-10-CM | POA: Diagnosis present

## 2015-01-24 DIAGNOSIS — I519 Heart disease, unspecified: Secondary | ICD-10-CM | POA: Diagnosis not present

## 2015-01-24 DIAGNOSIS — I5189 Other ill-defined heart diseases: Secondary | ICD-10-CM | POA: Diagnosis present

## 2015-01-24 DIAGNOSIS — N179 Acute kidney failure, unspecified: Secondary | ICD-10-CM | POA: Diagnosis present

## 2015-01-24 DIAGNOSIS — R0602 Shortness of breath: Secondary | ICD-10-CM

## 2015-01-24 DIAGNOSIS — R600 Localized edema: Secondary | ICD-10-CM | POA: Diagnosis present

## 2015-01-24 DIAGNOSIS — F17210 Nicotine dependence, cigarettes, uncomplicated: Secondary | ICD-10-CM | POA: Diagnosis present

## 2015-01-24 HISTORY — DX: Pneumonia, unspecified organism: J18.9

## 2015-01-24 HISTORY — DX: Bacteremia: R78.81

## 2015-01-24 HISTORY — DX: Other ill-defined heart diseases: I51.89

## 2015-01-24 HISTORY — DX: Pulmonary hypertension, unspecified: I27.20

## 2015-01-24 LAB — ETHANOL: Alcohol, Ethyl (B): <5 mg/dL (ref <5)

## 2015-01-24 LAB — COMPREHENSIVE METABOLIC PANEL
ALBUMIN: 1.9 g/dL — AB (ref 3.5–5.0)
ALK PHOS: 168 U/L — AB (ref 38–126)
ALT: 37 U/L (ref 14–54)
AST: 58 U/L — ABNORMAL HIGH (ref 15–41)
Anion gap: 7 (ref 5–15)
BUN: 23 mg/dL — ABNORMAL HIGH (ref 6–20)
CHLORIDE: 94 mmol/L — AB (ref 101–111)
CO2: 27 mmol/L (ref 22–32)
CREATININE: 1.13 mg/dL — AB (ref 0.44–1.00)
Calcium: 9.1 mg/dL (ref 8.9–10.3)
GFR calc non Af Amer: 53 mL/min — ABNORMAL LOW (ref >60)
GLUCOSE: 153 mg/dL — AB (ref 65–99)
Potassium: 3.3 mmol/L — ABNORMAL LOW (ref 3.5–5.1)
SODIUM: 128 mmol/L — AB (ref 135–145)
Total Bilirubin: 10.7 mg/dL — ABNORMAL HIGH (ref 0.3–1.2)
Total Protein: 6 g/dL — ABNORMAL LOW (ref 6.5–8.1)

## 2015-01-24 LAB — CBC WITH DIFFERENTIAL/PLATELET
BASOS ABS: 0 10*3/uL (ref 0.0–0.1)
BASOS PCT: 0 %
EOS ABS: 0.1 10*3/uL (ref 0.0–0.7)
EOS PCT: 1 %
HCT: 35.8 % — ABNORMAL LOW (ref 36.0–46.0)
HEMOGLOBIN: 12.3 g/dL (ref 12.0–15.0)
LYMPHS ABS: 0.7 10*3/uL (ref 0.7–4.0)
Lymphocytes Relative: 5 %
MCH: 33.5 pg (ref 26.0–34.0)
MCHC: 34.4 g/dL (ref 30.0–36.0)
MCV: 97.5 fL (ref 78.0–100.0)
Monocytes Absolute: 0.7 10*3/uL (ref 0.1–1.0)
Monocytes Relative: 5 %
NEUTROS PCT: 89 %
Neutro Abs: 12.4 10*3/uL — ABNORMAL HIGH (ref 1.7–7.7)
PLATELETS: 57 10*3/uL — AB (ref 150–400)
RBC: 3.67 MIL/uL — AB (ref 3.87–5.11)
RDW: 16.2 % — ABNORMAL HIGH (ref 11.5–15.5)
WBC: 13.8 10*3/uL — AB (ref 4.0–10.5)

## 2015-01-24 LAB — LIPASE, BLOOD: Lipase: 54 U/L — ABNORMAL HIGH (ref 22–51)

## 2015-01-24 LAB — LACTIC ACID, PLASMA: Lactic Acid, Venous: 3.1 mmol/L (ref 0.5–2.0)

## 2015-01-24 LAB — AMMONIA: Ammonia: 50 umol/L — ABNORMAL HIGH (ref 9–35)

## 2015-01-24 MED ORDER — VANCOMYCIN HCL IN DEXTROSE 1-5 GM/200ML-% IV SOLN
1000.0000 mg | Freq: Once | INTRAVENOUS | Status: AC
  Administered 2015-01-24: 1000 mg via INTRAVENOUS
  Filled 2015-01-24: qty 200

## 2015-01-24 MED ORDER — PIPERACILLIN-TAZOBACTAM 3.375 G IVPB
3.3750 g | Freq: Once | INTRAVENOUS | Status: AC
  Administered 2015-01-24: 3.375 g via INTRAVENOUS
  Filled 2015-01-24: qty 50

## 2015-01-24 MED ORDER — SODIUM CHLORIDE 0.9 % IV SOLN
INTRAVENOUS | Status: DC
  Administered 2015-01-24: 1000 mL/h via INTRAVENOUS
  Administered 2015-01-24: 125 mL/h via INTRAVENOUS
  Administered 2015-01-25: via INTRAVENOUS

## 2015-01-24 NOTE — ED Notes (Signed)
Checked O2 sat was 89 % put patient on 2lpm via [place]. Made Dr. Freida Busman aware.

## 2015-01-24 NOTE — H&P (Signed)
History and Physical  Marissa Figueroa WUJ:[id number] DOB: April 12, 1956 DOA: 01/24/2015  Referring physician: Dr Freida Busman, ED physician PCP: Ignatius Specking., MD   Chief Complaint: Cough  HPI: Marissa Figueroa is a 59 y.o. female  With a history of alcoholic cirrhosis of the liver on lactulose, congestive heart failure on 3 diuretics (Lasix, spironolactone, metaxalone).  The patient presents to the emergency department with intermittent fevers and chills over the past 48 hours. Additionally, the patient has had cough with productive sputum over the past few days. Her cough has been getting worse. No provoking or palliating factors. Her temperature today at home was 102.8. She denies alcohol intake   Review of Systems:   Pt complains of urinary frequency, decreased appetite.  Pt denies any fevers, chills, nausea, vomiting, diarrhea, constipation, abdominal pain, shortness of breath, dyspnea on exertion, orthopnea, wheezing, palpitations, headache, vision changes, lightheadedness, dizziness, diarrhea, constipation, melena, rectal bleeding.  Review of systems are otherwise negative  Past Medical History  Diagnosis Date  . Cirrhosis (HCC)     ALCOHOLIC  . Alcoholic cirrhosis of liver (HCC)   . CHF (congestive heart failure) (HCC)   . Shortness of breath     exertion   Past Surgical History  Procedure Laterality Date  . Tubal ligation    . Tonsilectomy, adenoidectomy, bilateral myringotomy and tubes    . Paracentesis  07/06/08  . Paracentesis  05/25/2008  . Esophagogastroduodenoscopy (egd) with propofol N/A 08/15/2014    Procedure: ESOPHAGOGASTRODUODENOSCOPY (EGD) WITH PROPOFOL;  Surgeon: Malissa Hippo, MD;  Location: [facility];  Service: Endoscopy;  Laterality: N/A;   Social History:  reports that she has been smoking Cigarettes.  She has a 8.75 pack-year smoking history. She has never used smokeless tobacco. She reports that she does not drink alcohol or use illicit drugs. Patient  lives at home & is able to participate in activities of daily living with assistance  Allergies  Allergen Reactions  . Ultram [Tramadol] Other (See Comments)    Headache   Patient unaware family history  Prior to Admission medications   Medication Sig Start Date End Date Taking? Authorizing Provider  B Complex-Biotin-FA (B-COMPLEX PO) Take 1 tablet by mouth daily.   Yes Historical Provider, MD  cholecalciferol (VITAMIN D) 1000 UNITS tablet Take 1,000 Units by mouth daily.   Yes Historical Provider, MD  ENSURE (ENSURE) Take 1 Can by mouth every other day.    Yes Historical Provider, MD  folic acid (FOLVITE) 1 MG tablet Take 1 tablet (1 mg total) by mouth daily. 10/20/14  Yes Len Blalock, NP  furosemide (LASIX) 40 MG tablet Take 1 tablet (40 mg total) by mouth daily. Patient taking differently: Take 40 mg by mouth 2 (two) times daily.  10/25/14  Yes Len Blalock, NP  lactulose (CHRONULAC) 10 GM/15ML solution Take 45 mLs (30 g total) by mouth 3 (three) times daily. 12/27/14  Yes [name] Isaiah Blakes, MD  metolazone (ZAROXOLYN) 5 MG tablet Take 1 tablet (5 mg total) by mouth once. 5mg  today, 5mg  tomorrow. Then take 5mg  every 4th day as needed. 01/17/15  Yes Len Blalock, NP  Multiple Vitamin (MULTIVITAMIN WITH MINERALS) TABS tablet Take 1 tablet by mouth daily. 08/18/14  Yes Henderson Cloud, MD  omeprazole (PRILOSEC) 20 MG capsule Take 20 mg by mouth 2 (two) times daily.   Yes Historical Provider, MD  potassium chloride SA (KLOR-CON M20) 20 MEQ tablet Take 3 tablets (60 mEq total) by mouth  2 (two) times daily. Patient taking differently: Take 20 mEq by mouth daily.  10/20/14  Yes Len Blalock, NP  spironolactone (ALDACTONE) 100 MG tablet Take 100 mg by mouth daily.   Yes Historical Provider, MD  Thiamine HCl (B-1) 250 MG TABS Take 1 tablet by mouth daily.   Yes Historical Provider, MD  vitamin B-12 (CYANOCOBALAMIN) 1000 MCG tablet Take 1,000 mcg by mouth daily.   Yes Historical  Provider, MD    Physical Exam: BP 117/44 mmHg  Pulse 110  Temp(Src) 101.8 F (38.8 C) (Oral)  Resp 25  Ht  (1.727 m)  Wt 111.449 kg (245 lb 11.2 oz)  BMI 37.37 kg/m2  SpO2 96%  General: Middle-aged Caucasian female. Awake and alert and oriented x3. No acute cardiopulmonary distress.  Eyes: Pupils equal, round, reactive to light. Extraocular muscles are intact. Sclerae anicteric and noninjected.  ENT:  Dry mucosal membranes. No mucosal lesions.   Neck: Neck supple without lymphadenopathy. No carotid bruits. No masses palpated.  Cardiovascular: Regular rate with normal S1-S2 sounds. No murmurs, rubs, gallops auscultated. No JVD. 3+ pitting edema in lower extremities bilaterally Respiratory: Diminished breath sounds. Rales in bases bilaterally. No wheezing.  Abdomen: Soft, nontender, nondistended. Active bowel sounds. No masses or hepatosplenomegaly  Skin: Dry, warm to touch. 2+ dorsalis pedis and radial pulses. Musculoskeletal: No calf or leg pain. All major joints not erythematous nontender.  Psychiatric: Intact judgment and insight.  Neurologic: No focal neurological deficits. Cranial nerves II through XII are grossly intact.           Labs on Admission:  Basic Metabolic Panel:  Recent Labs Lab 01/24/15 2118  NA 128*  K 3.3*  CL 94*  CO2 27  GLUCOSE 153*  BUN 23*  CREATININE 1.13*  CALCIUM 9.1   Liver Function Tests:  Recent Labs Lab 01/24/15 2118  AST 58*  ALT 37  ALKPHOS 168*  BILITOT 10.7*  PROT 6.0*  ALBUMIN 1.9*    Recent Labs Lab 01/24/15 2118  LIPASE 54*    Recent Labs Lab 01/24/15 2118  AMMONIA 50*   CBC:  Recent Labs Lab 01/24/15 2118  WBC 13.8*  NEUTROABS 12.4*  HGB 12.3  HCT 35.8*  MCV 97.5  PLT 57*   Cardiac Enzymes: No results for input(s): CKTOTAL, CKMB, CKMBINDEX, TROPONINI in the last 168 hours.  BNP (last 3 results)  Recent Labs  08/09/14 1459  BNP 178.0*    ProBNP (last 3 results) No results for  input(s): PROBNP in the last 8760 hours.  CBG: No results for input(s): GLUCAP in the last 168 hours.  Radiological Exams on Admission: Dg Chest 2 View  01/24/2015   CLINICAL DATA:  59 year old female with acute fever and back pain today.  EXAM: CHEST  2 VIEW  COMPARISON:  08/09/2014  FINDINGS: The cardiomediastinal silhouette is unremarkable.  Mild patchy bibasilar opacities are noted and suspicious for infection/ pneumonia.  There is no evidence of pneumothorax, definite pleural effusion or suspicious nodule.  No acute bony abnormalities are identified.  IMPRESSION: Patchy bibasilar opacities suspicious for pneumonia/infection.   Electronically Signed   By: Harmon Pier M.D.   On: 01/24/2015 22:09     Assessment/Plan Present on Admission:  . Community acquired pneumonia . Sepsis (HCC) . Acute renal injury (HCC) . Hyponatremia . Hypokalemia . Hyperglycemia  This patient was discussed with the ED physician, including pertinent vitals, physical exam findings, labs, and imaging.  We also discussed care given by the ED provider.  #  1 sepsis  Patient having tachycardia and hypotension. With fever and acute renal injury, patient qualifies for sepsis  Given patient has history of cirrhosis and CHF, will cautiously administer 1 L bolus of fluids.  Hold diuretics at least overnight  We'll give additional fluids pending patient's response.  Blood cultures obtained  Urine culture pending  Broad-spectrum antibiotics: Vancomycin and Zosyn per pharmacy consult #2 bibasilar community acquired pneumonia  Patient does not qualify for healthcare associated pneumonia given her hospitalization within the past 90 days was less than 48 hours.  Legionella and strep antigen by urine  Procalcitonin level  HIV  Follow CBC   broad-spectrum antibiotics as above  As the patient stabilizes, should be able to manage with typical community-acquired pneumonia treatment with ceftriaxone and  azithromycin  #3 acute renal injury  After bolus, continue maintenance fluids at 125 miles per hour  #4 hyponatremia  Likely secondary to dehydration, sepsis  Will obtain BMP in the morning  #5 hypokalemia  Continue home potassium regimen  Check potassium in the morning  #6 glycemia  Hemoglobin A1c  Sliding scale insulin  CBG before meals and daily at bedtime  #7 history of CHF  Hold diuretics and cautiously administer fluids  DVT prophylaxis: As the patient has thrombocytopenia, will place on SCDs   Consultants: None  Code Status: Full code  Family Communication: Daughter in the room   Disposition Plan: Admit to stepdown    Levie Heritage, DO [facility] Pager [phone]

## 2015-01-24 NOTE — ED Notes (Signed)
CRITICAL VALUE ALERT  Critical value received:   Lactic Acid 3.1  Date of notification:  01/24/15  Time of notification:  2213  Critical value read back:Yes.    Nurse who received alert:  Rcockram  MD notified (1st page):  Dr Freida Busman

## 2015-01-24 NOTE — ED Notes (Addendum)
Pt c/o fever and back pain since earlier today. Pt's temp at 8pm was 102.8

## 2015-01-24 NOTE — ED Provider Notes (Signed)
CSN: [id number]     Arrival date & time 01/24/15  2036 History  By signing my name below, I, Murriel Hopper, attest that this documentation has been prepared under the direction and in the presence of Lorre Nick, MD. Electronically Signed: Murriel Hopper, ED Scribe. 01/24/2015. 9:10 PM.  Chief Complaint  Patient presents with  . Fever      The history is provided by the patient. No language interpreter was used.   HPI Comments: Marissa Figueroa is a 59 y.o. female with cirrhosis who presents to the Emergency Department complaining of an intermittent fever with associated urinary frequency that has been present since earlier today. Her daughter states that she arrived at pt home today, and reports pt was shaking uncontrollably. Daughter states she took pt temperature and it was 102.8. Daughter states pt ammonia and bilirubin levels were elevated earlier today as well. Daughter also notes pt has had trouble urinating and has been constipated lately as well. Pt states that she has urinated "15 times today" and states the urinary frequency began yesterday. Her daughter notes that a week ago she saw a specialist for her cirrhosis after not being able to urinate or have a bowel movement for a few days, and received a diuretic, which has increased her urinary frequency. Pt denies vomiting or abdominal pain.      Past Medical History  Diagnosis Date  . Cirrhosis (HCC)     ALCOHOLIC  . Alcoholic cirrhosis of liver (HCC)   . CHF (congestive heart failure) (HCC)   . Shortness of breath     exertion   Past Surgical History  Procedure Laterality Date  . Tubal ligation    . Tonsilectomy, adenoidectomy, bilateral myringotomy and tubes    . Paracentesis  07/06/08  . Paracentesis  05/25/2008  . Esophagogastroduodenoscopy (egd) with propofol N/A 08/15/2014    Procedure: ESOPHAGOGASTRODUODENOSCOPY (EGD) WITH PROPOFOL;  Surgeon: Malissa Hippo, MD;  Location: [facility];  Service: Endoscopy;   Laterality: N/A;   History reviewed. No pertinent family history. Social History  Substance Use Topics  . Smoking status: Current Some Day Smoker -- 0.25 packs/day for 35 years    Types: Cigarettes  . Smokeless tobacco: Never Used     Comment: Smokes 4 cigarettes per day  . Alcohol Use: No   OB History    No data available     Review of Systems  Constitutional: Positive for fever.  Gastrointestinal: Positive for constipation. Negative for vomiting and abdominal pain.  Genitourinary: Positive for frequency.  All other systems reviewed and are negative.     Allergies  Ultram  Home Medications   Prior to Admission medications   Medication Sig Start Date End Date Taking? Authorizing Provider  cholecalciferol (VITAMIN D) 1000 UNITS tablet Take 1,000 Units by mouth daily.    Historical Provider, MD  ciprofloxacin (CIPRO) 250 MG tablet Take 1 tablet (250 mg total) by mouth 2 (two) times daily. 12/27/14   [name] Isaiah Blakes, MD  ENSURE (ENSURE) Take 1 Can by mouth daily.     Historical Provider, MD  folic acid (FOLVITE) 1 MG tablet Take 1 tablet (1 mg total) by mouth daily. 10/20/14   Len Blalock, NP  furosemide (LASIX) 40 MG tablet Take 1 tablet (40 mg total) by mouth daily. Patient taking differently: Take 40 mg by mouth 2 (two) times daily.  10/25/14   Len Blalock, NP  lactulose (CHRONULAC) 10 GM/15ML solution Take 45 mLs (30 g total)  by mouth 3 (three) times daily. 12/27/14   Henderson Cloud, MD  metolazone (ZAROXOLYN) 5 MG tablet Take 1 tablet (5 mg total) by mouth once. 5mg  today, 5mg  tomorrow. Then take 5mg  every 4th day as needed. 01/17/15   Len Blalock, NP  Multiple Vitamin (MULTIVITAMIN WITH MINERALS) TABS tablet Take 1 tablet by mouth daily. 08/18/14   Henderson Cloud, MD  omeprazole (PRILOSEC) 20 MG capsule Take 20 mg by mouth 2 (two) times daily.    Historical Provider, MD  potassium chloride SA (KLOR-CON M20) 20 MEQ tablet Take 3 tablets (60  mEq total) by mouth 2 (two) times daily. Patient taking differently: Take 20 mEq by mouth daily.  10/20/14   Len Blalock, NP  rifaximin (XIFAXAN) 550 MG TABS tablet Take 1 tablet (550 mg total) by mouth 2 (two) times daily. Patient not taking: Reported on 01/17/2015 12/27/14   Henderson Cloud, MD  spironolactone (ALDACTONE) 100 MG tablet Take 100 mg by mouth daily.    Historical Provider, MD  Thiamine HCl (B-1) 250 MG TABS Take 1 tablet by mouth daily.    Historical Provider, MD  vitamin B-12 (CYANOCOBALAMIN) 1000 MCG tablet Take 1,000 mcg by mouth daily.    Historical Provider, MD   BP 117/44 mmHg  Pulse 129  Temp(Src) 101.8 F (38.8 C) (Oral)  Resp 22  Ht 5\' 8"  (1.727 m)  Wt 245 lb 11.2 oz (111.449 kg)  BMI 37.37 kg/m2  SpO2 94% Physical Exam  Constitutional: She is oriented to person, place, and time. She appears well-developed and well-nourished. She appears lethargic.  Non-toxic appearance. No distress.  HENT:  Head: Normocephalic and atraumatic.  Eyes: Conjunctivae, EOM and lids are normal. Pupils are equal, round, and reactive to light.  Neck: Normal range of motion. Neck supple. No tracheal deviation present. No thyroid mass present.  Cardiovascular: Normal rate, regular rhythm and normal heart sounds.  Exam reveals no gallop.   No murmur heard. Pulmonary/Chest: Effort normal and breath sounds normal. No stridor. No respiratory distress. She has no decreased breath sounds. She has no wheezes. She has no rhonchi. She has no rales.  Abdominal: Soft. Normal appearance and bowel sounds are normal. She exhibits no distension. There is no tenderness. There is no rebound and no CVA tenderness.  Musculoskeletal: Normal range of motion. She exhibits no edema or tenderness.  Neurological: She is oriented to person, place, and time. She has normal strength. She appears lethargic. No cranial nerve deficit or sensory deficit. GCS eye subscore is 4. GCS verbal subscore is 5. GCS motor  subscore is 6.  Skin: Skin is warm and dry. No abrasion and no rash noted.  Psychiatric: Her affect is blunt. Her speech is delayed. She is slowed.     Nursing note and vitals reviewed.   ED Course  Procedures (including critical care time)  DIAGNOSTIC STUDIES: Oxygen Saturation is 94% on room air, normal by my interpretation.    COORDINATION OF CARE: 9:23 PM Discussed treatment plan with pt at bedside and pt agreed to plan.   Labs Review Labs Reviewed  CULTURE, BLOOD (ROUTINE X 2)  CULTURE, BLOOD (ROUTINE X 2)  URINE CULTURE  CBC WITH DIFFERENTIAL/PLATELET  COMPREHENSIVE METABOLIC PANEL  LIPASE, BLOOD  URINALYSIS, ROUTINE W REFLEX MICROSCOPIC (NOT AT [facility])  I-STAT CG4 LACTIC ACID, ED    Imaging Review No results found. I have personally reviewed and evaluated these images and lab results as part of my medical  decision-making.   EKG Interpretation None      MDM   Final diagnoses:  SOB (shortness of breath)    I, Greenleigh Kauth T, personally performed the services described in this documentation. All medical record entries made by the scribe were at my direction and in my presence.  I have reviewed the chart and discharge instructions and agree that the record reflects my personal performance and is accurate and complete. Keandra Medero T.  01/24/2015. 9:39 PM.    Pt started on vancomycin and Zosyn for pneumonia. Blood pressure has been stable here. Blood cultures obtained. Will be admitted to telemetry   Lorre Nick, MD 01/24/15 2250

## 2015-01-25 ENCOUNTER — Inpatient Hospital Stay (HOSPITAL_COMMUNITY): Payer: Medicare Other

## 2015-01-25 ENCOUNTER — Ambulatory Visit (INDEPENDENT_AMBULATORY_CARE_PROVIDER_SITE_OTHER): Payer: Medicare Other | Admitting: Internal Medicine

## 2015-01-25 ENCOUNTER — Encounter (HOSPITAL_COMMUNITY): Payer: Self-pay | Admitting: *Deleted

## 2015-01-25 DIAGNOSIS — J189 Pneumonia, unspecified organism: Secondary | ICD-10-CM

## 2015-01-25 DIAGNOSIS — D638 Anemia in other chronic diseases classified elsewhere: Secondary | ICD-10-CM | POA: Diagnosis present

## 2015-01-25 DIAGNOSIS — E871 Hypo-osmolality and hyponatremia: Secondary | ICD-10-CM

## 2015-01-25 DIAGNOSIS — R609 Edema, unspecified: Secondary | ICD-10-CM | POA: Diagnosis present

## 2015-01-25 DIAGNOSIS — R739 Hyperglycemia, unspecified: Secondary | ICD-10-CM

## 2015-01-25 DIAGNOSIS — F1010 Alcohol abuse, uncomplicated: Secondary | ICD-10-CM

## 2015-01-25 DIAGNOSIS — A419 Sepsis, unspecified organism: Secondary | ICD-10-CM

## 2015-01-25 DIAGNOSIS — K7030 Alcoholic cirrhosis of liver without ascites: Secondary | ICD-10-CM

## 2015-01-25 DIAGNOSIS — N179 Acute kidney failure, unspecified: Secondary | ICD-10-CM

## 2015-01-25 DIAGNOSIS — R600 Localized edema: Secondary | ICD-10-CM | POA: Diagnosis present

## 2015-01-25 DIAGNOSIS — D696 Thrombocytopenia, unspecified: Secondary | ICD-10-CM

## 2015-01-25 LAB — BASIC METABOLIC PANEL
ANION GAP: 7 (ref 5–15)
BUN: 26 mg/dL — ABNORMAL HIGH (ref 6–20)
CALCIUM: 8.1 mg/dL — AB (ref 8.9–10.3)
CO2: 25 mmol/L (ref 22–32)
CREATININE: 1.25 mg/dL — AB (ref 0.44–1.00)
Chloride: 94 mmol/L — ABNORMAL LOW (ref 101–111)
GFR calc Af Amer: 54 mL/min — ABNORMAL LOW (ref >60)
GFR calc non Af Amer: 46 mL/min — ABNORMAL LOW (ref >60)
GLUCOSE: 218 mg/dL — AB (ref 65–99)
POTASSIUM: 3.3 mmol/L — AB (ref 3.5–5.1)
Sodium: 126 mmol/L — ABNORMAL LOW (ref 135–145)

## 2015-01-25 LAB — LACTIC ACID, PLASMA
Lactic Acid, Venous: 2.6 mmol/L (ref 0.5–2.0)
Lactic Acid, Venous: 2.1 mmol/L (ref 0.5–2.0)

## 2015-01-25 LAB — GLUCOSE, CAPILLARY
GLUCOSE-CAPILLARY: 126 mg/dL — AB (ref 65–99)
GLUCOSE-CAPILLARY: 135 mg/dL — AB (ref 65–99)
GLUCOSE-CAPILLARY: 145 mg/dL — AB (ref 65–99)
GLUCOSE-CAPILLARY: 231 mg/dL — AB (ref 65–99)
Glucose-Capillary: 232 mg/dL — ABNORMAL HIGH (ref 65–99)

## 2015-01-25 LAB — CBC
HCT: 29.4 % — ABNORMAL LOW (ref 36.0–46.0)
Hemoglobin: 10.1 g/dL — ABNORMAL LOW (ref 12.0–15.0)
MCH: 33.9 pg (ref 26.0–34.0)
MCHC: 34.4 g/dL (ref 30.0–36.0)
MCV: 98.7 fL (ref 78.0–100.0)
Platelets: 45 10*3/uL — ABNORMAL LOW (ref 150–400)
RBC: 2.98 MIL/uL — ABNORMAL LOW (ref 3.87–5.11)
RDW: 16.5 % — ABNORMAL HIGH (ref 11.5–15.5)
WBC: 23.4 10*3/uL — ABNORMAL HIGH (ref 4.0–10.5)

## 2015-01-25 LAB — URINALYSIS, ROUTINE W REFLEX MICROSCOPIC
GLUCOSE, UA: NEGATIVE mg/dL
Ketones, ur: NEGATIVE mg/dL
Leukocytes, UA: NEGATIVE
Nitrite: NEGATIVE
PROTEIN: NEGATIVE mg/dL
Specific Gravity, Urine: 1.01 (ref 1.005–1.030)
UROBILINOGEN UA: 1 mg/dL (ref 0.0–1.0)
pH: 6 (ref 5.0–8.0)

## 2015-01-25 LAB — URINE MICROSCOPIC-ADD ON

## 2015-01-25 LAB — MRSA PCR SCREENING: MRSA BY PCR: NEGATIVE

## 2015-01-25 LAB — STREP PNEUMONIAE URINARY ANTIGEN: Strep Pneumo Urinary Antigen: NEGATIVE

## 2015-01-25 LAB — PROCALCITONIN: Procalcitonin: 3.15 ng/mL

## 2015-01-25 MED ORDER — SODIUM CHLORIDE 0.9 % IV BOLUS (SEPSIS)
1000.0000 mL | Freq: Once | INTRAVENOUS | Status: AC
  Administered 2015-01-25: 1000 mL via INTRAVENOUS

## 2015-01-25 MED ORDER — PANTOPRAZOLE SODIUM 40 MG PO TBEC
80.0000 mg | DELAYED_RELEASE_TABLET | Freq: Every day | ORAL | Status: DC
  Administered 2015-01-25: 40 mg via ORAL
  Administered 2015-01-26 – 2015-01-30 (×5): 80 mg via ORAL
  Filled 2015-01-25 (×8): qty 2

## 2015-01-25 MED ORDER — DICLOFENAC SODIUM 1 % TD GEL
2.0000 g | Freq: Three times a day (TID) | TRANSDERMAL | Status: DC
  Administered 2015-01-25 – 2015-01-28 (×11): 2 g via TOPICAL
  Filled 2015-01-25: qty 100

## 2015-01-25 MED ORDER — LACTULOSE 10 GM/15ML PO SOLN
30.0000 g | Freq: Three times a day (TID) | ORAL | Status: DC
  Administered 2015-01-25 – 2015-01-30 (×13): 30 g via ORAL
  Filled 2015-01-25 (×15): qty 60

## 2015-01-25 MED ORDER — INSULIN ASPART 100 UNIT/ML ~~LOC~~ SOLN: 0.0000 [IU] | Freq: Every day | SUBCUTANEOUS | Status: DC

## 2015-01-25 MED ORDER — RIFAXIMIN 550 MG PO TABS
550.0000 mg | ORAL_TABLET | Freq: Two times a day (BID) | ORAL | Status: DC
  Administered 2015-01-25 – 2015-01-30 (×10): 550 mg via ORAL
  Filled 2015-01-25 (×11): qty 1

## 2015-01-25 MED ORDER — FUROSEMIDE 10 MG/ML IJ SOLN
20.0000 mg | Freq: Once | INTRAMUSCULAR | Status: AC
  Administered 2015-01-25: 20 mg via INTRAVENOUS
  Filled 2015-01-25: qty 2

## 2015-01-25 MED ORDER — VANCOMYCIN HCL 10 G IV SOLR
1250.0000 mg | INTRAVENOUS | Status: DC
  Administered 2015-01-25: 1250 mg via INTRAVENOUS
  Filled 2015-01-25 (×2): qty 1250

## 2015-01-25 MED ORDER — PIPERACILLIN-TAZOBACTAM 3.375 G IVPB
INTRAVENOUS | Status: AC
  Filled 2015-01-25: qty 50

## 2015-01-25 MED ORDER — INSULIN ASPART 100 UNIT/ML ~~LOC~~ SOLN
0.0000 [IU] | Freq: Three times a day (TID) | SUBCUTANEOUS | Status: DC
  Administered 2015-01-25: 7 [IU] via SUBCUTANEOUS

## 2015-01-25 MED ORDER — POTASSIUM CHLORIDE CRYS ER 20 MEQ PO TBCR
20.0000 meq | EXTENDED_RELEASE_TABLET | Freq: Three times a day (TID) | ORAL | Status: AC
  Administered 2015-01-25 (×3): 20 meq via ORAL
  Filled 2015-01-25 (×3): qty 1

## 2015-01-25 MED ORDER — INFLUENZA VAC SPLIT QUAD 0.5 ML IM SUSY
0.5000 mL | PREFILLED_SYRINGE | INTRAMUSCULAR | Status: DC
  Filled 2015-01-25: qty 0.5

## 2015-01-25 MED ORDER — VANCOMYCIN HCL IN DEXTROSE 1-5 GM/200ML-% IV SOLN
INTRAVENOUS | Status: AC
  Filled 2015-01-25: qty 200

## 2015-01-25 MED ORDER — POTASSIUM CHLORIDE IN NACL 20-0.9 MEQ/L-% IV SOLN
INTRAVENOUS | Status: DC
  Administered 2015-01-25 – 2015-01-26 (×2): via INTRAVENOUS
  Administered 2015-01-27: 1 mL via INTRAVENOUS

## 2015-01-25 MED ORDER — INSULIN ASPART 100 UNIT/ML ~~LOC~~ SOLN
0.0000 [IU] | Freq: Every day | SUBCUTANEOUS | Status: DC
  Administered 2015-01-25: 2 [IU] via SUBCUTANEOUS

## 2015-01-25 MED ORDER — VANCOMYCIN HCL IN DEXTROSE 1-5 GM/200ML-% IV SOLN
1000.0000 mg | Freq: Once | INTRAVENOUS | Status: AC
  Administered 2015-01-25: 1000 mg via INTRAVENOUS
  Filled 2015-01-25: qty 200

## 2015-01-25 MED ORDER — INSULIN ASPART 100 UNIT/ML ~~LOC~~ SOLN
0.0000 [IU] | Freq: Three times a day (TID) | SUBCUTANEOUS | Status: DC
  Administered 2015-01-25 – 2015-01-26 (×2): 1 [IU] via SUBCUTANEOUS

## 2015-01-25 MED ORDER — ENSURE ENLIVE PO LIQD
237.0000 mL | ORAL | Status: DC
  Administered 2015-01-25 – 2015-01-29 (×3): 237 mL via ORAL

## 2015-01-25 MED ORDER — PIPERACILLIN-TAZOBACTAM 3.375 G IVPB
3.3750 g | Freq: Three times a day (TID) | INTRAVENOUS | Status: DC
  Administered 2015-01-25 – 2015-01-27 (×8): 3.375 g via INTRAVENOUS
  Filled 2015-01-25 (×18): qty 50

## 2015-01-25 MED ORDER — PNEUMOCOCCAL VAC POLYVALENT 25 MCG/0.5ML IJ INJ
0.5000 mL | INJECTION | INTRAMUSCULAR | Status: AC
  Administered 2015-01-26: 0.5 mL via INTRAMUSCULAR
  Filled 2015-01-25: qty 0.5

## 2015-01-25 NOTE — Consult Note (Signed)
Reason for Consult: Alcoholic liver disease Referring Physician: Hospitalist services.  Marissa Figueroa is an 59 y.o. female.  HPI: Admitted thru the ED yesterday after experiencing chills x 3 days at home. Home temp 100.8 at home. On arrival to the ED her temp was 102. CXR was suspicious for pneumonia. Pneumonia tx with Zosyn and Vancomycin IV.  She tells me today she feels better. She says her left arm is weak.  She tells me she has been taking her medications as ordered at home. Her bilirubin is consistently high.  She denies etoh recently. Weight in office was 245. Today her weight is 248.  She feels 5% better.   Hepatic Function Panel     Component Value Date/Time   PROT 6.0* 01/24/2015 2118   ALBUMIN 1.9* 01/24/2015 2118   AST 58* 01/24/2015 2118   ALT 37 01/24/2015 2118   ALKPHOS 168* 01/24/2015 2118   BILITOT 10.7* 01/24/2015 2118   BILIDIR 4.2* 12/14/2014 1520   IBILI 7.2* 12/14/2014 1520       Past Medical History  Diagnosis Date  . Cirrhosis ([place])     ALCOHOLIC  . Alcoholic cirrhosis of liver ([place])   . CHF (congestive heart failure) ([place])   . Shortness of breath     exertion    Past Surgical History  Procedure Laterality Date  . Tubal ligation    . Tonsilectomy, adenoidectomy, bilateral myringotomy and tubes    . Paracentesis  07/06/08  . Paracentesis  05/25/2008  . Esophagogastroduodenoscopy (egd) with propofol N/A 08/15/2014    Procedure: ESOPHAGOGASTRODUODENOSCOPY (EGD) WITH PROPOFOL;  Surgeon: Rogene Houston, MD;  Location: [facility];  Service: Endoscopy;  Laterality: N/A;    History reviewed. No pertinent family history.  Social History:  reports that she has been smoking Cigarettes.  She has a 8.75 pack-year smoking history. She has never used smokeless tobacco. She reports that she does not drink alcohol or use illicit drugs.  Allergies:  Allergies  Allergen Reactions  . Ultram [Tramadol] Other (See Comments)    Headache    Medications: I  have reviewed the patient's current medications.  Results for orders placed or performed during the hospital encounter of 01/24/15 (from the past 48 hour(s))  CBC with Differential/Platelet     Status: Abnormal   Collection Time: 01/24/15  9:18 PM  Result Value Ref Range   WBC 13.8 (H) 4.0 - 10.5 K/uL   RBC 3.67 (L) 3.87 - 5.11 MIL/uL   Hemoglobin 12.3 12.0 - 15.0 g/dL   HCT 35.8 (L) 36.0 - 46.0 %   MCV 97.5 78.0 - 100.0 fL   MCH 33.5 26.0 - 34.0 pg   MCHC 34.4 30.0 - 36.0 g/dL   RDW 16.2 (H) 11.5 - 15.5 %   Platelets 57 (L) 150 - 400 K/uL    Comment: SPECIMEN CHECKED FOR CLOTS CONSISTENT WITH PREVIOUS RESULT    Neutrophils Relative % 89 %   Neutro Abs 12.4 (H) 1.7 - 7.7 K/uL   Lymphocytes Relative 5 %   Lymphs Abs 0.7 0.7 - 4.0 K/uL   Monocytes Relative 5 %   Monocytes Absolute 0.7 0.1 - 1.0 K/uL   Eosinophils Relative 1 %   Eosinophils Absolute 0.1 0.0 - 0.7 K/uL   Basophils Relative 0 %   Basophils Absolute 0.0 0.0 - 0.1 K/uL  Comprehensive metabolic panel     Status: Abnormal   Collection Time: 01/24/15  9:18 PM  Result Value Ref Range   Sodium 128 (  L) 135 - 145 mmol/L   Potassium 3.3 (L) 3.5 - 5.1 mmol/L   Chloride 94 (L) 101 - 111 mmol/L   CO2 27 22 - 32 mmol/L   Glucose, Bld 153 (H) 65 - 99 mg/dL   BUN 23 (H) 6 - 20 mg/dL   Creatinine, Ser 1.13 (H) 0.44 - 1.00 mg/dL   Calcium 9.1 8.9 - 10.3 mg/dL   Total Protein 6.0 (L) 6.5 - 8.1 g/dL   Albumin 1.9 (L) 3.5 - 5.0 g/dL   AST 58 (H) 15 - 41 U/L   ALT 37 14 - 54 U/L   Alkaline Phosphatase 168 (H) 38 - 126 U/L   Total Bilirubin 10.7 (H) 0.3 - 1.2 mg/dL   GFR calc non Af Amer 53 (L) >60 mL/min   GFR calc Af Amer >60 >60 mL/min    Comment: (NOTE) The eGFR has been calculated using the CKD EPI equation. This calculation has not been validated in all clinical situations. eGFR's persistently <60 mL/min signify possible Chronic Kidney Disease.    Anion gap 7 5 - 15  Lipase, blood     Status: Abnormal   Collection  Time: 01/24/15  9:18 PM  Result Value Ref Range   Lipase 54 (H) 22 - 51 U/L  Culture, blood (routine x 2)     Status: None (Preliminary result)   Collection Time: 01/24/15  9:18 PM  Result Value Ref Range   Specimen Description RIGHT ANTECUBITAL    Special Requests BOTTLES DRAWN AEROBIC AND ANAEROBIC 10CC EACH    Culture NO GROWTH < 12 HOURS    Report Status PENDING   Ammonia     Status: Abnormal   Collection Time: 01/24/15  9:18 PM  Result Value Ref Range   Ammonia 50 (H) 9 - 35 umol/L  Ethanol     Status: None   Collection Time: 01/24/15  9:18 PM  Result Value Ref Range   Alcohol, Ethyl (B) <5 <5 mg/dL    Comment:        LOWEST DETECTABLE LIMIT FOR SERUM ALCOHOL IS 5 mg/dL FOR MEDICAL PURPOSES ONLY   Lactic acid, plasma     Status: Abnormal   Collection Time: 01/24/15  9:18 PM  Result Value Ref Range   Lactic Acid, Venous 3.1 (HH) 0.5 - 2.0 mmol/L    Comment: CRITICAL RESULT CALLED TO, READ BACK BY AND VERIFIED WITH: COCKRAM,R ON 01/24/15 AT 2210 BY LOY,C   Culture, blood (routine x 2)     Status: None (Preliminary result)   Collection Time: 01/24/15  9:32 PM  Result Value Ref Range   Specimen Description BLOOD LEFT HAND    Special Requests BOTTLES DRAWN AEROBIC ONLY AEB 4CC    Culture NO GROWTH < 12 HOURS    Report Status PENDING   MRSA PCR Screening     Status: None   Collection Time: 01/25/15 12:05 AM  Result Value Ref Range   MRSA by PCR NEGATIVE NEGATIVE    Comment:        The GeneXpert MRSA Assay (FDA approved for NASAL specimens only), is one component of a comprehensive MRSA colonization surveillance program. It is not intended to diagnose MRSA infection nor to guide or monitor treatment for MRSA infections.   Glucose, capillary     Status: Abnormal   Collection Time: 01/25/15 12:34 AM  Result Value Ref Range   Glucose-Capillary 231 (H) 65 - 99 mg/dL  Basic metabolic panel     Status: Abnormal  Collection Time: 01/25/15  4:53 AM  Result Value Ref  Range   Sodium 126 (L) 135 - 145 mmol/L   Potassium 3.3 (L) 3.5 - 5.1 mmol/L   Chloride 94 (L) 101 - 111 mmol/L   CO2 25 22 - 32 mmol/L   Glucose, Bld 218 (H) 65 - 99 mg/dL   BUN 26 (H) 6 - 20 mg/dL   Creatinine, Ser 1.25 (H) 0.44 - 1.00 mg/dL   Calcium 8.1 (L) 8.9 - 10.3 mg/dL   GFR calc non Af Amer 46 (L) >60 mL/min   GFR calc Af Amer 54 (L) >60 mL/min    Comment: (NOTE) The eGFR has been calculated using the CKD EPI equation. This calculation has not been validated in all clinical situations. eGFR's persistently <60 mL/min signify possible Chronic Kidney Disease.    Anion gap 7 5 - 15  CBC     Status: Abnormal   Collection Time: 01/25/15  4:53 AM  Result Value Ref Range   WBC 23.4 (H) 4.0 - 10.5 K/uL   RBC 2.98 (L) 3.87 - 5.11 MIL/uL   Hemoglobin 10.1 (L) 12.0 - 15.0 g/dL   HCT 29.4 (L) 36.0 - 46.0 %   MCV 98.7 78.0 - 100.0 fL   MCH 33.9 26.0 - 34.0 pg   MCHC 34.4 30.0 - 36.0 g/dL   RDW 16.5 (H) 11.5 - 15.5 %   Platelets 45 (L) 150 - 400 K/uL    Comment: SPECIMEN CHECKED FOR CLOTS CONSISTENT WITH PREVIOUS RESULT   Procalcitonin     Status: None   Collection Time: 01/25/15  4:53 AM  Result Value Ref Range   Procalcitonin 3.15 ng/mL    Comment:        Interpretation: PCT > 2 ng/mL: Systemic infection (sepsis) is likely, unless other causes are known. (NOTE)         ICU PCT Algorithm               Non ICU PCT Algorithm    ----------------------------     ------------------------------         PCT < 0.25 ng/mL                 PCT < 0.1 ng/mL     Stopping of antibiotics            Stopping of antibiotics       strongly encouraged.               strongly encouraged.    ----------------------------     ------------------------------       PCT level decrease by               PCT < 0.25 ng/mL       >= 80% from peak PCT       OR PCT 0.25 - 0.5 ng/mL          Stopping of antibiotics                                             encouraged.     Stopping of antibiotics            encouraged.    ----------------------------     ------------------------------       PCT level decrease by              PCT >= 0.25  ng/mL       < 80% from peak PCT        AND PCT >= 0.5 ng/mL            Continuing antibiotics                                               encouraged.       Continuing antibiotics            encouraged.    ----------------------------     ------------------------------     PCT level increase compared          PCT > 0.5 ng/mL         with peak PCT AND          PCT >= 0.5 ng/mL             Escalation of antibiotics                                          strongly encouraged.      Escalation of antibiotics        strongly encouraged.   Glucose, capillary     Status: Abnormal   Collection Time: 01/25/15  7:23 AM  Result Value Ref Range   Glucose-Capillary 232 (H) 65 - 99 mg/dL   Comment 1 Notify RN    Comment 2 Document in Chart   Lactic acid, plasma     Status: Abnormal   Collection Time: 01/25/15  8:14 AM  Result Value Ref Range   Lactic Acid, Venous 2.6 (HH) 0.5 - 2.0 mmol/L    Comment: CRITICAL RESULT CALLED TO, READ BACK BY AND VERIFIED WITH: MCDANIEL,M AT 8:40AM ON 01/25/15 BY FESTERMAN,C   Urinalysis, Routine w reflex microscopic (not at [facility])     Status: Abnormal   Collection Time: 01/25/15  9:49 AM  Result Value Ref Range   Color, Urine AMBER (A) YELLOW    Comment: BIOCHEMICALS MAY BE AFFECTED BY COLOR   APPearance HAZY (A) CLEAR   Specific Gravity, Urine 1.010 1.005 - 1.030   pH 6.0 5.0 - 8.0   Glucose, UA NEGATIVE NEGATIVE mg/dL   Hgb urine dipstick SMALL (A) NEGATIVE   Bilirubin Urine MODERATE (A) NEGATIVE   Ketones, ur NEGATIVE NEGATIVE mg/dL   Protein, ur NEGATIVE NEGATIVE mg/dL   Urobilinogen, UA 1.0 0.0 - 1.0 mg/dL   Nitrite NEGATIVE NEGATIVE   Leukocytes, UA NEGATIVE NEGATIVE  Urine microscopic-add on     Status: Abnormal   Collection Time: 01/25/15  9:49 AM  Result Value Ref Range   Squamous Epithelial / LPF MANY (A)  RARE   RBC / HPF 3-6 <3 RBC/hpf   Casts RED CELL CAST (A) NEGATIVE    Comment: GRANULAR CAST   Urine-Other NON SQUAMOUS EPITHELIAL CELLS 5 TO 10 PER HPF   Glucose, capillary     Status: Abnormal   Collection Time: 01/25/15 11:34 AM  Result Value Ref Range   Glucose-Capillary 126 (H) 65 - 99 mg/dL   Comment 1 Notify RN    Comment 2 Document in Chart     Dg Chest 2 View  01/24/2015  CLINICAL DATA:  59 year old female with acute fever and back pain today. EXAM: CHEST  2 VIEW  COMPARISON:  08/09/2014 FINDINGS: The cardiomediastinal silhouette is unremarkable. Mild patchy bibasilar opacities are noted and suspicious for infection/ pneumonia. There is no evidence of pneumothorax, definite pleural effusion or suspicious nodule. No acute bony abnormalities are identified. IMPRESSION: Patchy bibasilar opacities suspicious for pneumonia/infection. Electronically Signed   By: Margarette Canada M.D.   On: 01/24/2015 22:09    ROS Blood pressure 89/37, pulse 81, temperature 98.1 F (36.7 C), temperature source Oral, resp. rate 16, height $RemoveBe'5\' 8"'USEbWOnvD$  (1.727 m), weight 248 lb 7.3 oz (112.7 kg), SpO2 97 %. Physical Exam Alert and oriented. Skin warm and dry. Oral mucosa is moist.   . Sclera nicteric, conjunctivae is pink. Thyroid not enlarged. No cervical lymphadenopathy. Lungs clear. Heart regular rate and rhythm.  Abdomen is soft. Bowel sounds are positive. No hepatomegaly. No abdominal masses felt. No tenderness.  3-4 + edema to lower extremities. Patient is alert and oriented. Assessment/Plan: Alcoholic end stage liver disease. I will discuss with Dr. Laural Golden. Possible paracentesis.  SETZER,TERRI W 01/25/2015, 12:07 PM     Patient interviewed and examined; Suspect acute illness secondary to pneumonia. Doubt that she has significant ascites but ultrasound will be obtained and if she does have significant amount of fluid diagnostic tap would be needed. Patient remains with decompensated alcoholic cirrhosis and  cholestasis.

## 2015-01-25 NOTE — Plan of Care (Signed)
Problem: Phase I Progression Outcomes Goal: OOB as tolerated unless otherwise ordered Outcome: Completed/Met Date Met:  01/25/15 Up in chair    Goal: Voiding-avoid urinary catheter unless indicated Outcome: Not Progressing Pt unable to void after several hours. >400cc seen on bladder scaner Goal: Hemodynamically stable Outcome: Not Progressing bp fluctuating  sys 70"s and 90"s

## 2015-01-25 NOTE — Progress Notes (Addendum)
[facility] PROGRESS NOTE  Lacretia NicksChristine L Debruler VWU:[id number] DOB: 1955/05/04 DOA: 01/24/2015 PCP: Ignatius SpeckingVYAS,DHRUV B., MD    Code Status: Full code Family Communication: Discussed with patient; family not available Disposition Plan: Discharge to home when clinically appropriate   Consultants:  Gastroenterology will pending  Procedures:  Echo, pending  Antibiotics:   Vancomycin 10/12  Zosyn 10/12  HPI/Subjective: Patient says that she feels no better and no worse. Currently, she has no complaints of chest pain, abdominal pain, or shortness of breath.  Objective: Filed Vitals:   01/25/15 0800  BP: 89/37  Pulse: 81  Temp:   Resp: 16   temperature 98.1. Oxygen saturation 97%.   Intake/Output Summary (Last 24 hours) at 01/25/15 0904 Last data filed at 01/25/15 0800  Gross per 24 hour  Intake   1930 ml  Output      0 ml  Net   1930 ml   Filed Weights   01/24/15 2050 01/25/15 0015 01/25/15 0500  Weight: 111.449 kg (245 lb 11.2 oz) 112.7 kg (248 lb 7.3 oz) 112.7 kg (248 lb 7.3 oz)    Exam:   General:  Obese ill-appearing 59 year old woman sitting up in bed, in no acute distress.  HEENT: Bilateral scleral icterus and conjunctival injection.  Cardiovascular: S1, S2, with a 1 to 2/6 systolic murmur.  Respiratory: occasional crackles noted in the mid lobes; breathing nonlabored breast.  Abdomen: obese, positive bowel sounds, query mild ascites; nontender  Musculoskeletal: no acute hot red joints. 1+ pitting edema of the lower extremities extending up to her thighs bilaterally.  Neurologic: She is alert and oriented to herself, hospital, month, and year.   Data Reviewed: Basic Metabolic Panel:  Recent Labs Lab 01/24/15 2118 01/25/15 0453  NA 128* 126*  K 3.3* 3.3*  CL 94* 94*  CO2 27 25  GLUCOSE 153* 218*  BUN 23* 26*  CREATININE 1.13* 1.25*  CALCIUM 9.1 8.1*   Liver Function Tests:  Recent Labs Lab 01/24/15 2118  AST 58*  ALT 37  ALKPHOS  168*  BILITOT 10.7*  PROT 6.0*  ALBUMIN 1.9*    Recent Labs Lab 01/24/15 2118  LIPASE 54*    Recent Labs Lab 01/24/15 2118  AMMONIA 50*   CBC:  Recent Labs Lab 01/24/15 2118 01/25/15 0453  WBC 13.8* 23.4*  NEUTROABS 12.4*  --   HGB 12.3 10.1*  HCT 35.8* 29.4*  MCV 97.5 98.7  PLT 57* 45*   Cardiac Enzymes: No results for input(s): CKTOTAL, CKMB, CKMBINDEX, TROPONINI in the last 168 hours. BNP (last 3 results)  Recent Labs  08/09/14 1459  BNP 178.0*    ProBNP (last 3 results) No results for input(s): PROBNP in the last 8760 hours.  CBG:  Recent Labs Lab 01/25/15 0034 01/25/15 0723  GLUCAP 231* 232*    Recent Results (from the past 240 hour(s))  Culture, blood (routine x 2)     Status: None (Preliminary result)   Collection Time: 01/24/15  9:18 PM  Result Value Ref Range Status   Specimen Description RIGHT ANTECUBITAL  Final   Special Requests BOTTLES DRAWN AEROBIC AND ANAEROBIC 10CC EACH  Final   Culture PENDING  Incomplete   Report Status PENDING  Incomplete  Culture, blood (routine x 2)     Status: None (Preliminary result)   Collection Time: 01/24/15  9:32 PM  Result Value Ref Range Status   Specimen Description BLOOD LEFT HAND  Final   Special Requests BOTTLES DRAWN AEROBIC ONLY AEB 4CC  Final  Culture PENDING  Incomplete   Report Status PENDING  Incomplete  MRSA PCR Screening     Status: None   Collection Time: 01/25/15 12:05 AM  Result Value Ref Range Status   MRSA by PCR NEGATIVE NEGATIVE Final    Comment:        The GeneXpert MRSA Assay (FDA approved for NASAL specimens only), is one component of a comprehensive MRSA colonization surveillance program. It is not intended to diagnose MRSA infection nor to guide or monitor treatment for MRSA infections.      Studies: Dg Chest 2 View  01/24/2015  CLINICAL DATA:  59 year old female with acute fever and back pain today. EXAM: CHEST  2 VIEW COMPARISON:  08/09/2014 FINDINGS: The  cardiomediastinal silhouette is unremarkable. Mild patchy bibasilar opacities are noted and suspicious for infection/ pneumonia. There is no evidence of pneumothorax, definite pleural effusion or suspicious nodule. No acute bony abnormalities are identified. IMPRESSION: Patchy bibasilar opacities suspicious for pneumonia/infection. Electronically Signed   By: Harmon Pier M.D.   On: 01/24/2015 22:09    Scheduled Meds: . feeding supplement (ENSURE ENLIVE)  237 mL Oral QODAY  . [START ON 01/26/2015] Influenza vac split quadrivalent PF  0.5 mL Intramuscular Tomorrow-1000  . insulin aspart  0-20 Units Subcutaneous TID WC  . insulin aspart  0-5 Units Subcutaneous QHS  . lactulose  30 g Oral TID  . pantoprazole  80 mg Oral Daily  . piperacillin-tazobactam (ZOSYN)  IV  3.375 g Intravenous Q8H  . [START ON 01/26/2015] pneumococcal 23 valent vaccine  0.5 mL Intramuscular Tomorrow-1000  . vancomycin  1,250 mg Intravenous Q24H   Continuous Infusions: . sodium chloride 125 mL/hr at 01/25/15 0800    Assessment and plan:  Principal Problem:   Community acquired pneumonia Active Problems:   Sepsis (HCC)   Acute renal injury (HCC)   Peripheral edema   Alcoholic cirrhosis ([facility])   Hyponatremia   Bilirubinemia   Thrombocytopenia ([facility])   Hypokalemia   Hyperglycemia   Anemia of chronic disease  1. Bibasilar pneumonia.  Patient's chest x-ray on admission revealed patchy bibasilar opacities suspicious for pneumonia. Her white blood cell count was 13.8 on admission. She was febrile with a temperature of 101.8. Blood cultures were ordered and she was started on vancomycin and Zosyn. -Her white blood cell count increased to 23.4; this may be due to demargination after antibiotics were started, but will follow closely. -We'll continue current management. -Strep pneumo antigen, Legionella antigen, and HIV antibody are pending. Blood cultures ordered and are negative to date.  Sepsis, secondary to  pneumonia. The patient's blood pressure was in the 80s systolically on admission, but I doubt septic shock due to her volume depletion and quick improvement with IV fluids. Her lactic acid was 3.1 on admission. Her procalcitonin level was 3.15. - She was started on vigorous IV fluids with improvement in her blood pressure. We'll continue IV fluids, but will decrease the rate as tolerated due to her peripheral edema.  Hyperglycemia. Patient has no history of diabetes mellitus, but her glucose was 153 on admission. -Sliding-scale NovoLog was ordered on admission. -Hemoglobin A1c was ordered and is pending. -We'll change her diet to carbohydrate modified.  Alcoholic cirrhosis with associated coagulopathy, hyperbilirubinemia, mildly elevated ammonia, thrombocytopenia, etc. Patient reported that she stopped drinking in May of this year. She is treated chronically with Lasix, lactulose, Zaroxolyn, and spironolactone. She is followed by gastroenterologist, Dr. Karilyn Cota. -Her ammonia level is slightly elevated, but she does not appear to  be encephalopathic. There does not appear to be significant ascites. -Her diuretics are on hold due to her volume depletion and soft blood pressures. Will consider restarting them tomorrow as her blood pressure improves. -Continue lactulose. -Dr. Karilyn Cota has been consulted.  Peripheral edema. The patient does have evidence of mild peripheral edema, likely from severe cirrhosis and hypoalbuminemia. -We'll order 2-D echocardiogram to assess for diastolic, right heart, or left heart failure. -We will consider restarting diuretics once her blood pressure improves more. -We'll decrease IV fluid rate a little.  Acute kidney injury. Patient has no known history of chronic kidney disease per chart review. Her creatinine on 01/17/15 was within normal limits. -Her creatinine was 1.13 on admission and has increased slightly; likely from prerenal azotemia. -We'll continue gentle  IV fluids and consider restarting diuretics tomorrow.   Hyponatremia. The patient has chronic hyponatremia from her cirrhosis. Her serum sodium was 128 on admission. Her best line appears to be around 129-131. -Following IV fluids, it has fallen to 126. Will decrease the rate of IV fluids as her blood pressure can tolerate it. Consider restarting diuretic therapy tomorrow.  Hyponatremia. Patient serum potassium was 3.3 on admission. This was likely secondary to diuretic therapy. -We'll add potassium chloride to the IV fluids. Will order oral potassium as tolerated.  Normocytic anemia. -Patient probably has anemia of chronic disease superimposed on a history of April 2016. -Her hemoglobin appears to be slightly below baseline, likely attributed to hemodilution from IV fluids.      Time spent: ICU/critical care time 45 minutes     [facility]  [facility] Pager [phone] . If 7PM-7AM, please contact night-coverage at [email or web address], password [facility] 01/25/2015, 9:04 AM  LOS: 1 day

## 2015-01-25 NOTE — Progress Notes (Signed)
Please see Ms.Setzer, NPs note for details. Patient is well known to me. Ultrasound obtained and is negative for ascites. Therefore she does not have SBP. Acute illness appears to be secondary to pneumonia she appears to be responding to vancomycin and Zosyn. 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 Unfortunately her cholestasis has not improved with abstinence. Resume Xifaxan she is on for hepatic encephalopathy.

## 2015-01-25 NOTE — Progress Notes (Signed)
PT IS HAVING PAIN AND DIFFICULTY MOVING HER LT ARM. NO EDEMA OR REDNESS.

## 2015-01-25 NOTE — Progress Notes (Signed)
ANTIBIOTIC CONSULT NOTE- follow up  Pharmacy Consult for zosyn, vanc Indication: sepsis  Allergies  Allergen Reactions  . Ultram [Tramadol] Other (See Comments)    Headache   Patient Measurements: Height:  (172.7 cm) Weight: 248 lb 7.3 oz (112.7 kg) IBW/kg (Calculated) : 63.9  Vital Signs: Temp: 98.3 F (36.8 C) (10/12 0400) Temp Source: Oral (10/12 0400) BP: 73/57 mmHg (10/12 0700) Pulse Rate: 76 (10/12 0700)  Labs:  Recent Labs  01/24/15 2118 01/25/15 0453  WBC 13.8* 23.4*  HGB 12.3 10.1*  PLT 57* 45*  CREATININE 1.13* 1.25*   Estimated Creatinine Clearance: 64.6 mL/min (by C-G formula based on Cr of 1.25).  No results for input(s): VANCOTROUGH, VANCOPEAK, VANCORANDOM, GENTTROUGH, GENTPEAK, GENTRANDOM, TOBRATROUGH, TOBRAPEAK, TOBRARND, AMIKACINPEAK, AMIKACINTROU, AMIKACIN in the last 72 hours.   Microbiology: Recent Results (from the past 720 hour(s))  Urine culture     Status: None   Collection Time: 12/26/14  8:40 PM  Result Value Ref Range Status   Specimen Description URINE, CLEAN CATCH  Final   Special Requests IMMUNE:COMPROMISED  Final   Culture   Final    >=100,000 COLONIES/mL KLEBSIELLA PNEUMONIAE >=100,000 COLONIES/mL ENTEROCOCCUS SPECIES Performed at [facility]    Report Status 12/31/2014 FINAL  Final   Organism ID, Bacteria KLEBSIELLA PNEUMONIAE  Final   Organism ID, Bacteria ENTEROCOCCUS SPECIES  Final      Susceptibility   Klebsiella pneumoniae - MIC*    AMPICILLIN >=32 RESISTANT Resistant     CEFAZOLIN <=4 SENSITIVE Sensitive     CEFTRIAXONE <=1 SENSITIVE Sensitive     CIPROFLOXACIN <=0.25 SENSITIVE Sensitive     GENTAMICIN <=1 SENSITIVE Sensitive     IMIPENEM <=0.25 SENSITIVE Sensitive     NITROFURANTOIN <=16 SENSITIVE Sensitive     TRIMETH/SULFA <=20 SENSITIVE Sensitive     AMPICILLIN/SULBACTAM 8 SENSITIVE Sensitive     PIP/TAZO <=4 SENSITIVE Sensitive     * >=100,000 COLONIES/mL KLEBSIELLA PNEUMONIAE   Enterococcus  species - MIC*    AMPICILLIN <=2 SENSITIVE Sensitive     LEVOFLOXACIN 1 SENSITIVE Sensitive     NITROFURANTOIN <=16 SENSITIVE Sensitive     VANCOMYCIN 1 SENSITIVE Sensitive     * >=100,000 COLONIES/mL ENTEROCOCCUS SPECIES  Culture, blood (routine x 2)     Status: None (Preliminary result)   Collection Time: 01/24/15  9:18 PM  Result Value Ref Range Status   Specimen Description RIGHT ANTECUBITAL  Final   Special Requests BOTTLES DRAWN AEROBIC AND ANAEROBIC 10CC EACH  Final   Culture PENDING  Incomplete   Report Status PENDING  Incomplete  Culture, blood (routine x 2)     Status: None (Preliminary result)   Collection Time: 01/24/15  9:32 PM  Result Value Ref Range Status   Specimen Description BLOOD LEFT HAND  Final   Special Requests BOTTLES DRAWN AEROBIC ONLY AEB 4CC  Final   Culture PENDING  Incomplete   Report Status PENDING  Incomplete  MRSA PCR Screening     Status: None   Collection Time: 01/25/15 12:05 AM  Result Value Ref Range Status   MRSA by PCR NEGATIVE NEGATIVE Final    Comment:        The GeneXpert MRSA Assay (FDA approved for NASAL specimens only), is one component of a comprehensive MRSA colonization surveillance program. It is not intended to diagnose MRSA infection nor to guide or monitor treatment for MRSA infections.    Medical History: Past Medical History  Diagnosis Date  . Cirrhosis (HCC)  ALCOHOLIC  . Alcoholic cirrhosis of liver (HCC)   . CHF (congestive heart failure) (HCC)   . Shortness of breath     exertion   Medications:  Scheduled:  . feeding supplement (ENSURE ENLIVE)  237 mL Oral QODAY  . [START ON 01/26/2015] Influenza vac split quadrivalent PF  0.5 mL Intramuscular Tomorrow-1000  . insulin aspart  0-20 Units Subcutaneous TID WC  . insulin aspart  0-5 Units Subcutaneous QHS  . lactulose  30 g Oral TID  . pantoprazole  80 mg Oral Daily  . piperacillin-tazobactam (ZOSYN)  IV  3.375 g Intravenous Q8H  . [START ON 01/26/2015]  pneumococcal 23 valent vaccine  0.5 mL Intramuscular Tomorrow-1000  . vancomycin  1,250 mg Intravenous Q24H   Assessment: 59 yo female being treated for sepsis. Pt is obese with SCr 1.25.  WBC elevated. Normalized clcr ~ 50-55 ml/min. Pt received Vancomycin 2000mg  IV and Zosyn 3.375gm IV on admission.  Goal of Therapy:  Vancomycin trough level 15-20 mcg/ml  Plan:  Zosyn 3.375gm IV q8h, each dose over 4 hrs Vancomycin 1250mg  IV q24hrs Check trough at steady state Monitor labs, renal fxn, and c/s  Valrie HartHall, Fatima Fedie A, RPH 01/25/2015,7:44 AM

## 2015-01-25 NOTE — Care Management Note (Signed)
Case Management Note  Patient Details  Name: Marissa Figueroa MRN: [id number] Date of Birth: [birth year]  Expected Discharge Date:                  Expected Discharge Plan:  Home w Home Health Services  In-House Referral:  NA  Discharge planning Services  CM Consult  Post Acute Care Choice:  Resumption of Svcs/PTA Provider Choice offered to:  Patient  DME Arranged:    DME Agency:     HH Arranged:  RN, PT HH Agency:  [facility]  Status of Service:  In process, will continue to follow  Medicare Important Message Given:    Date Medicare IM Given:    Medicare IM give by:    Date Additional Medicare IM Given:    Additional Medicare Important Message give by:     If discussed at Long Length of Stay Meetings, dates discussed:    Additional Comments: Pt is from home, lives with her daughter [name]. Pt ind with ADL's. [facility]. Alroy BailiffLinda Lothian, [facility], made aware and will obtain pt info from chart. Pt plans to return home with resumption of HH services at DC. Pt does not have home O2 prior to admission. Pt has walker, wheelchair and BSC. Will cont to follow for DC planning.  Malcolm Metrohildress, Jan Olano Demske, RN 01/25/2015, 11:38 AM

## 2015-01-25 NOTE — Progress Notes (Signed)
ANTIBIOTIC CONSULT NOTE-Preliminary  Pharmacy Consult for zosyn, vanc Indication: sepsis  Allergies  Allergen Reactions  . Ultram [Tramadol] Other (See Comments)    Headache    Patient Measurements: Height: 5\' 8"  (172.7 cm) Weight: 245 lb 11.2 oz (111.449 kg) IBW/kg (Calculated) : 63.9   Vital Signs: Temp: 100 F (37.8 C) (10/11 2330) Temp Source: Oral (10/11 2330) BP: 92/51 mmHg (10/11 2330) Pulse Rate: 108 (10/11 2330)  Labs:  Recent Labs  01/24/15 2118  WBC 13.8*  HGB 12.3  PLT 57*  CREATININE 1.13*    Estimated Creatinine Clearance: 71 mL/min (by C-G formula based on Cr of 1.13).  No results for input(s): VANCOTROUGH, VANCOPEAK, VANCORANDOM, GENTTROUGH, GENTPEAK, GENTRANDOM, TOBRATROUGH, TOBRAPEAK, TOBRARND, AMIKACINPEAK, AMIKACINTROU, AMIKACIN in the last 72 hours.   Microbiology: Recent Results (from the past 720 hour(s))  Urine culture     Status: None   Collection Time: 12/26/14  8:40 PM  Result Value Ref Range Status   Specimen Description URINE, CLEAN CATCH  Final   Special Requests IMMUNE:COMPROMISED  Final   Culture   Final    >=100,000 COLONIES/mL KLEBSIELLA PNEUMONIAE >=100,000 COLONIES/mL ENTEROCOCCUS SPECIES [facility]    Report Status 12/31/2014 FINAL  Final   Organism ID, Bacteria KLEBSIELLA PNEUMONIAE  Final   Organism ID, Bacteria ENTEROCOCCUS SPECIES  Final      Susceptibility   Klebsiella pneumoniae - MIC*    AMPICILLIN >=32 RESISTANT Resistant     CEFAZOLIN <=4 SENSITIVE Sensitive     CEFTRIAXONE <=1 SENSITIVE Sensitive     CIPROFLOXACIN <=0.25 SENSITIVE Sensitive     GENTAMICIN <=1 SENSITIVE Sensitive     IMIPENEM <=0.25 SENSITIVE Sensitive     NITROFURANTOIN <=16 SENSITIVE Sensitive     TRIMETH/SULFA <=20 SENSITIVE Sensitive     AMPICILLIN/SULBACTAM 8 SENSITIVE Sensitive     PIP/TAZO <=4 SENSITIVE Sensitive     * >=100,000 COLONIES/mL KLEBSIELLA PNEUMONIAE   Enterococcus species - MIC*    AMPICILLIN <=2  SENSITIVE Sensitive     LEVOFLOXACIN 1 SENSITIVE Sensitive     NITROFURANTOIN <=16 SENSITIVE Sensitive     VANCOMYCIN 1 SENSITIVE Sensitive     * >=100,000 COLONIES/mL ENTEROCOCCUS SPECIES  Culture, blood (routine x 2)     Status: None (Preliminary result)   Collection Time: 01/24/15  9:18 PM  Result Value Ref Range Status   Specimen Description RIGHT ANTECUBITAL  Final   Special Requests BOTTLES DRAWN AEROBIC AND ANAEROBIC 10CC EACH  Final   Culture PENDING  Incomplete   Report Status PENDING  Incomplete  Culture, blood (routine x 2)     Status: None (Preliminary result)   Collection Time: 01/24/15  9:32 PM  Result Value Ref Range Status   Specimen Description BLOOD LEFT HAND  Final   Special Requests BOTTLES DRAWN AEROBIC ONLY AEB 4CC  Final   Culture PENDING  Incomplete   Report Status PENDING  Incomplete    Medical History: Past Medical History  Diagnosis Date  . Cirrhosis (HCC)     ALCOHOLIC  . Alcoholic cirrhosis of liver (HCC)   . CHF (congestive heart failure) (HCC)   . Shortness of breath     exertion    Medications:  Scheduled:  . feeding supplement (ENSURE ENLIVE)  237 mL Oral QODAY  . insulin aspart  0-20 Units Subcutaneous TID WC  . insulin aspart  0-5 Units Subcutaneous QHS  . lactulose  30 g Oral TID  . pantoprazole  80 mg Oral Daily  .  piperacillin-tazobactam (ZOSYN)  IV  3.375 g Intravenous Once  . piperacillin-tazobactam (ZOSYN)  IV  3.375 g Intravenous Q8H  . vancomycin  1,000 mg Intravenous Once    Assessment: 59 yo female being treated for sepsis  Goal of Therapy:  Vancomycin trough level 15-20 mcg/ml  Plan:  Preliminary review of pertinent patient information completed.  Protocol will be initiated with a one-time dose(s) of vancomycin 1000 mg to add to the 1000 mg already given for loading dose of 2 grams. Zosyn 3.375 grams IV q 8 hours.  Jeani Hawking clinical pharmacist will complete review during morning rounds to assess patient and finalize  treatment regimen.  Thaddus Mcdowell, Berneice Heinrich, RPH 01/25/2015,12:11 AM

## 2015-01-25 NOTE — Progress Notes (Signed)
Inpatient Diabetes Program Recommendations  AACE/ADA: New Consensus Statement on Inpatient Glycemic Control (2015)  Target Ranges:  Prepandial:   less than 140 mg/dL      Peak postprandial:   less than 180 mg/dL (1-2 hours)      Critically ill patients:  140 - 180 mg/dL   Review of Glycemic Control  Diabetes history: None Current orders for Inpatient glycemic control: Novolog Sensitive + HS scale  Results for Marissa Figueroa, Marissa Figueroa (MRN [id number]) as of 01/25/2015 11:11  Ref. Range 12/26/2014 20:40  Hemoglobin A1C Latest Ref Range: 4.8-5.6 % 4.7 (Figueroa)    Inpatient Diabetes Program Recommendations: Correction (SSI): Fasting glucose 232 mg/dl this am. Please consider increasing correction to Novolog Moderate scale.   Thanks,  Christena DeemShannon Marwin Primmer RN, MSN, [facility] Inpatient Diabetes Coordinator Team Pager [phone] (8a-5p)

## 2015-01-26 ENCOUNTER — Inpatient Hospital Stay (HOSPITAL_COMMUNITY): Payer: Medicare Other

## 2015-01-26 DIAGNOSIS — K7030 Alcoholic cirrhosis of liver without ascites: Secondary | ICD-10-CM

## 2015-01-26 DIAGNOSIS — R609 Edema, unspecified: Secondary | ICD-10-CM

## 2015-01-26 DIAGNOSIS — R7881 Bacteremia: Secondary | ICD-10-CM

## 2015-01-26 DIAGNOSIS — A411 Sepsis due to other specified staphylococcus: Secondary | ICD-10-CM | POA: Diagnosis not present

## 2015-01-26 DIAGNOSIS — A499 Bacterial infection, unspecified: Secondary | ICD-10-CM

## 2015-01-26 HISTORY — DX: Bacteremia: R78.81

## 2015-01-26 LAB — CBC
HCT: 30.8 % — ABNORMAL LOW (ref 36.0–46.0)
Hemoglobin: 10.5 g/dL — ABNORMAL LOW (ref 12.0–15.0)
MCH: 33.4 pg (ref 26.0–34.0)
MCHC: 34.1 g/dL (ref 30.0–36.0)
MCV: 98.1 fL (ref 78.0–100.0)
PLATELETS: 50 10*3/uL — AB (ref 150–400)
RBC: 3.14 MIL/uL — AB (ref 3.87–5.11)
RDW: 16.3 % — AB (ref 11.5–15.5)
WBC: 14.4 10*3/uL — AB (ref 4.0–10.5)

## 2015-01-26 LAB — COMPREHENSIVE METABOLIC PANEL
ALK PHOS: 140 U/L — AB (ref 38–126)
ALT: 31 U/L (ref 14–54)
ANION GAP: 5 (ref 5–15)
AST: 47 U/L — ABNORMAL HIGH (ref 15–41)
Albumin: 1.5 g/dL — ABNORMAL LOW (ref 3.5–5.0)
BILIRUBIN TOTAL: 6.5 mg/dL — AB (ref 0.3–1.2)
BUN: 28 mg/dL — AB (ref 6–20)
CALCIUM: 7.9 mg/dL — AB (ref 8.9–10.3)
CO2: 27 mmol/L (ref 22–32)
Chloride: 98 mmol/L — ABNORMAL LOW (ref 101–111)
Creatinine, Ser: 1.06 mg/dL — ABNORMAL HIGH (ref 0.44–1.00)
GFR calc Af Amer: >60 mL/min (ref >60)
GFR, EST NON AFRICAN AMERICAN: 57 mL/min — AB (ref >60)
Glucose, Bld: 137 mg/dL — ABNORMAL HIGH (ref 65–99)
POTASSIUM: 3.5 mmol/L (ref 3.5–5.1)
SODIUM: 130 mmol/L — AB (ref 135–145)
TOTAL PROTEIN: 4.7 g/dL — AB (ref 6.5–8.1)

## 2015-01-26 LAB — PROTIME-INR
INR: 3.3 — ABNORMAL HIGH (ref 0.00–1.49)
PROTHROMBIN TIME: 32.9 s — AB (ref 11.6–15.2)

## 2015-01-26 LAB — GLUCOSE, CAPILLARY
GLUCOSE-CAPILLARY: 130 mg/dL — AB (ref 65–99)
GLUCOSE-CAPILLARY: 140 mg/dL — AB (ref 65–99)
Glucose-Capillary: 146 mg/dL — ABNORMAL HIGH (ref 65–99)
Glucose-Capillary: 167 mg/dL — ABNORMAL HIGH (ref 65–99)

## 2015-01-26 LAB — URINE CULTURE: Culture: NO GROWTH

## 2015-01-26 LAB — AMMONIA: AMMONIA: 51 umol/L — AB (ref 9–35)

## 2015-01-26 LAB — HEMOGLOBIN A1C
HEMOGLOBIN A1C: 5 % (ref 4.8–5.6)
Mean Plasma Glucose: 97 mg/dL

## 2015-01-26 LAB — PROCALCITONIN: PROCALCITONIN: 3.14 ng/mL

## 2015-01-26 LAB — HIV ANTIBODY (ROUTINE TESTING W REFLEX): HIV SCREEN 4TH GENERATION: NONREACTIVE

## 2015-01-26 MED ORDER — VANCOMYCIN HCL IN DEXTROSE 750-5 MG/150ML-% IV SOLN
750.0000 mg | Freq: Two times a day (BID) | INTRAVENOUS | Status: DC
  Administered 2015-01-26 – 2015-01-30 (×6): 750 mg via INTRAVENOUS
  Filled 2015-01-26 (×11): qty 150

## 2015-01-26 MED ORDER — SPIRONOLACTONE 25 MG PO TABS
25.0000 mg | ORAL_TABLET | Freq: Every day | ORAL | Status: DC
  Administered 2015-01-26 – 2015-01-29 (×4): 25 mg via ORAL
  Filled 2015-01-26 (×5): qty 1

## 2015-01-26 MED ORDER — FUROSEMIDE 20 MG PO TABS
20.0000 mg | ORAL_TABLET | Freq: Two times a day (BID) | ORAL | Status: DC
  Administered 2015-01-26 – 2015-01-29 (×6): 20 mg via ORAL
  Filled 2015-01-26 (×6): qty 1

## 2015-01-26 NOTE — Progress Notes (Signed)
ANTIBIOTIC CONSULT NOTE- follow up  Pharmacy Consult for Vancomycin and Zosyn Indication: sepsis / potential Gram (+) Bacteremia  Allergies  Allergen Reactions  . Ultram [Tramadol] Other (See Comments)    Headache   Patient Measurements: Height: 5\' 8"  (172.7 cm) Weight: 257 lb 0.9 oz (116.6 kg) IBW/kg (Calculated) : 63.9  Vital Signs: Temp: 97.9 F (36.6 C) (10/13 1154) Temp Source: Oral (10/13 1154) BP: 87/39 mmHg (10/13 1100) Pulse Rate: 78 (10/13 1100)  Labs:  Recent Labs  01/24/15 2118 01/25/15 0453 01/26/15 0431  WBC 13.8* 23.4* 14.4*  HGB 12.3 10.1* 10.5*  PLT 57* 45* 50*  CREATININE 1.13* 1.25* 1.06*   Estimated Creatinine Clearance: 77.6 mL/min (by C-G formula based on Cr of 1.06).  No results for input(s): VANCOTROUGH, VANCOPEAK, VANCORANDOM, GENTTROUGH, GENTPEAK, GENTRANDOM, TOBRATROUGH, TOBRAPEAK, TOBRARND, AMIKACINPEAK, AMIKACINTROU, AMIKACIN in the last 72 hours.   Microbiology: Recent Results (from the past 720 hour(s))  Culture, blood (routine x 2)     Status: None (Preliminary result)   Collection Time: 01/24/15  9:18 PM  Result Value Ref Range Status   Specimen Description BLOOD RIGHT ANTECUBITAL  Final   Special Requests BOTTLES DRAWN AEROBIC AND ANAEROBIC 10CC EACH  Final   Culture  Setup Time   Final    GRAM POSITIVE COCCI IN PAIRS RECOVERED FROM BOTH THE ANAEROBIC AND AEROBIC BOTTLES. Gram Stain Report Called to,Read Back By and Verified With: GRAY,M. AT 1245 ON 01/25/2015 BY BAUGHAM,M. Performed at [facility]    Culture   Final    GRAM POSITIVE COCCI Performed at [facility]    Report Status PENDING  Incomplete  Culture, blood (routine x 2)     Status: None (Preliminary result)   Collection Time: 01/24/15  9:32 PM  Result Value Ref Range Status   Specimen Description BLOOD LEFT HAND DRAWN BY RN  Final   Special Requests BOTTLES DRAWN AEROBIC ONLY 4CC  Final   Culture  Setup Time   Final    GRAM POSITIVE COCCI IN  CLUSTERS RECOVERED FROM THE AEROBIC BOTTLE Gram Stain Report Called to,Read Back By and Verified With: NEILSON,T. AT 0615 ON 01/26/2015 BY RESSEGGER,R. Performed at [facility]    Culture PENDING  Incomplete   Report Status PENDING  Incomplete  MRSA PCR Screening     Status: None   Collection Time: 01/25/15 12:05 AM  Result Value Ref Range Status   MRSA by PCR NEGATIVE NEGATIVE Final    Comment:        The GeneXpert MRSA Assay (FDA approved for NASAL specimens only), is one component of a comprehensive MRSA colonization surveillance program. It is not intended to diagnose MRSA infection nor to guide or monitor treatment for MRSA infections.   Urine culture     Status: None   Collection Time: 01/25/15  9:49 AM  Result Value Ref Range Status   Specimen Description URINE, CATHETERIZED  Final   Special Requests NONE  Final   Culture   Final    NO GROWTH 1 DAY [facility]formed at [facility]     Report Status 01/26/2015 FINAL  Final   Medical History: Past Medical History  Diagnosis Date  . Cirrhosis (HCC)     ALCOHOLIC  . Alcoholic cirrhosis of liver (HCC)   . CHF (congestive heart failure) (HCC)   . Shortness of breath     exertion   Medications:  Scheduled:  . diclofenac sodium  2 g Topical TID  . feeding supplement (  ENSURE ENLIVE)  237 mL Oral QODAY  . furosemide  20 mg Oral BID  . Influenza vac split quadrivalent PF  0.5 mL Intramuscular Tomorrow-1000  . lactulose  30 g Oral TID  . pantoprazole  80 mg Oral Daily  . piperacillin-tazobactam (ZOSYN)  IV  3.375 g Intravenous Q8H  . rifaximin  550 mg Oral BID  . spironolactone  25 mg Oral Daily  . vancomycin  1,250 mg Intravenous Q24H   Assessment: 59 yo female being treated for sepsis. Renal function labs improving.  Normalized clcr ~ 66 ml/min.   Goal of Therapy:  Vancomycin trough level 15-20 mcg/ml  Eradicate infection.   Plan:  Continue Zosyn 3.375gm IV q8h, each dose over 4 hrs Empirically  increase Vancomycin to  IV q12hrs Check trough at steady state Monitor labs, renal fxn, and c/s  Mady Gemma, [facility] 01/26/2015,11:55 AM

## 2015-01-26 NOTE — Progress Notes (Addendum)
Patient ID: Marissa NicksChristine L Hulon, female   DOB: 1955-12-29, 59 y.o.   MRN: [id number] Feels 60% better. Appetite is better. She has 4+ edema to lower extremities. B/P has been running low. Blood pressure 100/46, pulse 73, temperature 97.9 F (36.6 C), temperature source Oral, resp. rate 17, height 5\' 8"  (1.727 m), weight 257 lb 0.9 oz (116.6 kg), SpO2 98 %. K is normal.  Weight 256. I/O last 3 completed shifts: In: 5075 [P.O.:1200; I.V.:3075; Other:400; IV Piggyback:400] Out: 775 [Urine:775] Total I/O In: 2595 [P.O.:960; I.V.:1635] Out: -  CMP Latest Ref Rng 01/26/2015 01/25/2015 01/24/2015  Glucose 65 - 99 mg/dL 086(V137(H) 784(O218(H) 962(X153(H)  BUN 6 - 20 mg/dL 52(W28(H) 41(L26(H) 24(M23(H)  Creatinine 0.44 - 1.00 mg/dL 0.10(U1.06(H) 7.25(D1.25(H) 6.64(Q1.13(H)  Sodium 135 - 145 mmol/L 130(L) 126(L) 128(L)  Potassium 3.5 - 5.1 mmol/L 3.5 3.3(L) 3.3(L)  Chloride 101 - 111 mmol/L 98(L) 94(L) 94(L)  CO2 22 - 32 mmol/L 27 25 27   Calcium 8.9 - 10.3 mg/dL 7.9(L) 8.1(L) 9.1  Total Protein 6.5 - 8.1 g/dL 4.7(L) - 6.0(L)  Total Bilirubin 0.3 - 1.2 mg/dL 6.5(H) - 10.7(H)  Alkaline Phos 38 - 126 U/L 140(H) - 168(H)  AST 15 - 41 U/L 47(H) - 58(H)  ALT 14 - 54 U/L 31 - 37  Pneumonia : Blood cultures positive. Alcoholic liver disease, end stage. Bilirubuin is coming down.  Albumin is low. Will continue to monitor.

## 2015-01-26 NOTE — Evaluation (Signed)
Physical Therapy Evaluation Patient Details Name: Marissa Figueroa MRN: [id number] DOB: 1955/08/22 Today's Date: 01/26/2015   History of Present Illness  With a history of alcoholic cirrhosis of the liver on lactulose, congestive heart failure on 3 diuretics (Lasix, spironolactone, metaxalone). The patient presents to the emergency department with intermittent fevers and chills over the past 48 hours. Additionally, the patient has had cough with productive sputum over the past few days. Her cough has been getting worse. No provoking or palliating factors. Her temperature today at home was 102.8. She denies alcohol intake  The pt lives with her daughter who is usually available.  Pt states that she had been receiving HHPT up until recently.  She normally uses a walker in the home but is able to walk short distances independently.  Clinical Impression   Pt was seen for evaluation.  She was alert and cooperative.  She c/o a painful left shoulder of unknown etiology.  There is no pain at rest but is very painful with ROM.  She is very tender to palpation over the humeral head.  She has limited ROM of the left shoulder.  We initiated AA ROM exercise which she tolerated well.  I instructed her in use of a cane for self ROM but she was unable to perform the exercise due to pain and weakness on the left.  We will place a consult for OT as they specialize in these disorders.  Regarding her overall strength, conditioning:  She is moderately weak and deconditioned but was able to tolerate gentle bed exercise.  She was able to ambulate in the room with no assistive device and no instability.  Her BP was extremely low so she was not taken out into the hallway.  She was asymptomatic of hypotension.  She should be able to transition to home at d/c and would recommend resuming HHPT    Follow Up Recommendations Home health PT    Equipment Recommendations  None recommended by PT    Recommendations for Other  Services OT consult     Precautions / Restrictions Precautions Precautions: None Restrictions Weight Bearing Restrictions: No      Mobility  Bed Mobility Overal bed mobility: Modified Independent                Transfers Overall transfer level: Modified independent                  Ambulation/Gait Ambulation/Gait assistance: Supervision Ambulation Distance (Feet): 20 Feet Assistive device: None Gait Pattern/deviations: WFL(Within Functional Limits) Gait velocity: appropriate for situation Gait velocity interpretation: <1.8 ft/sec, indicative of risk for recurrent falls    Stairs            Wheelchair Mobility    Modified Rankin (Stroke Patients Only)       Balance Overall balance assessment: No apparent balance deficits (not formally assessed)                                           Pertinent Vitals/Pain Pain Assessment: No/denies pain (at rest)    Home Living Family/patient expects to be discharged to:: Private residence Living Arrangements: Children Available Help at Discharge: Family;Available 24 hours/day Type of Home: Apartment Home Access: Level entry     Home Layout: One level Home Equipment: Walker - 4 wheels;Bedside commode;Shower seat      Prior Function Level of Independence: Needs  assistance   Gait / Transfers Assistance Needed: pt ambulates short distances independently but uses a walker otherwise...she is independent in transfers.  ADL's / Homemaking Assistance Needed: assist with bathing and all household activities        Hand Dominance        Extremity/Trunk Assessment   Upper Extremity Assessment: LUE deficits/detail       LUE Deficits / Details: painful ROM with strength significantly limited by this pain.[name] Kitchen.[name] Kitchen.AA ROM is full at the hand, wrist and elbow...shoulder rotation is WNL, flexion to 70* and abduction to 60*   Lower Extremity Assessment: Generalized weakness (hx of peripheral  neuropathy)      Cervical / Trunk Assessment: Kyphotic  Communication   Communication: No difficulties  Cognition Arousal/Alertness: Awake/alert Behavior During Therapy: WFL for tasks assessed/performed Overall Cognitive Status: Within Functional Limits for tasks assessed                      General Comments      Exercises General Exercises - Lower Extremity Ankle Circles/Pumps: Strengthening;Both;10 reps;Supine Short Arc Quad: AROM;Both;10 reps;Supine Heel Slides: AAROM;Both;5 reps;Supine Hip ABduction/ADduction: AAROM;Both;5 reps;Supine Straight Leg Raises: AAROM;Both;5 reps;Supine Shoulder Exercises Shoulder Flexion: AAROM;Left;10 reps;Supine Shoulder ABduction: AAROM;Left;10 reps;Supine Shoulder External Rotation: AAROM;Left;10 reps;Supine      Assessment/Plan    PT Assessment Patient needs continued PT services  PT Diagnosis Generalized weakness   PT Problem List Decreased strength;Decreased range of motion;Decreased activity tolerance;Decreased mobility;Obesity;Cardiopulmonary status limiting activity  PT Treatment Interventions Gait training;Functional mobility training;Therapeutic exercise   PT Goals (Current goals can be found in the Care Plan section) Acute Rehab PT Goals Patient Stated Goal: wants to go home PT Goal Formulation: With patient Time For Goal Achievement: 02/09/15 Potential to Achieve Goals: Good    Frequency Min 3X/week   Barriers to discharge   none    Co-evaluation               End of Session Equipment Utilized During Treatment: Gait belt Activity Tolerance: Patient tolerated treatment well Patient left: in chair;with call bell/phone within reach;with nursing/sitter in room Nurse Communication: Mobility status         Time: [id number] PT Time Calculation (min) (ACUTE ONLY): 54 min   Charges:   PT Evaluation $Initial PT Evaluation Tier I: 1 Procedure PT Treatments $Therapeutic Exercise: 23-37 mins   PT G  CodesKonrad Penta:        Lovis More L  PT 01/26/2015, 2:30 PM [phone]

## 2015-01-26 NOTE — Progress Notes (Signed)
[facility] PROGRESS NOTE  KENNEDEY DIGILIO ZOX:[id number] DOB: 1955/12/31 DOA: 01/24/2015 PCP: Ignatius Specking., MD    Code Status: Full code Family Communication: Discussed with patient; family not available Disposition Plan: Discharge to home when clinically appropriate   Consultants:  Gastroenterology : Dr. Karilyn Cota  Procedures:  Echo, pending  Antibiotics:   Vancomycin 10/12>>  Zosyn 10/12>>  HPI/Subjective: Patient complains of feeling swollen in her hands and her legs. She denies shortness of breath.  Objective: Filed Vitals:   01/26/15 0800  BP: 97/42  Pulse: 79  Temp:   Resp: 18   temperature 97.9. Oxygen saturation 95%  Intake/Output Summary (Last 24 hours) at 01/26/15 0926 Last data filed at 01/26/15 0800  Gross per 24 hour  Intake   3035 ml  Output    775 ml  Net   2260 ml   Filed Weights   01/25/15 0015 01/25/15 0500 01/26/15 0500  Weight: 112.7 kg (248 lb 7.3 oz) 112.7 kg (248 lb 7.3 oz) 116.6 kg (257 lb 0.9 oz)    Exam:   General:  Obese ill-appearing 59 year old woman sitting up in bed, in no acute distress.  HEENT: Bilateral scleral icterus and conjunctival injection.  Cardiovascular: S1, S2, with a 1 to 2/6 systolic murmur.  Respiratory: occasional crackles noted in the mid lobes; breathing nonlabored at rest  Abdomen: obese, positive bowel sounds, nontender, nondistended  Musculoskeletal: no acute hot red joints. 1+ pitting edema of the lower extremities extending up to her thighs bilaterally.  Neurologic: She is alert and oriented to herself, hospital, month, and year.   Data Reviewed: Basic Metabolic Panel:  Recent Labs Lab 01/24/15 2118 01/25/15 0453 01/26/15 0431  NA 128* 126* 130*  K 3.3* 3.3* 3.5  CL 94* 94* 98*  CO2 GLUCOSE 153* 218* 137*  BUN 23* 26* 28*  CREATININE 1.13* 1.25* 1.06*  CALCIUM 9.1 8.1* 7.9*   Liver Function Tests:  Recent Labs Lab 01/24/15 2118 01/26/15 0431  AST 58* 47*   ALT 37 31  ALKPHOS 168* 140*  BILITOT 10.7* 6.5*  PROT 6.0* 4.7*  ALBUMIN 1.9* 1.5*    Recent Labs Lab 01/24/15 2118  LIPASE 54*    Recent Labs Lab 01/24/15 2118 01/26/15 0431  AMMONIA 50* 51*   CBC:  Recent Labs Lab 01/24/15 2118 01/25/15 0453 01/26/15 0431  WBC 13.8* 23.4* 14.4*  NEUTROABS 12.4*  --   --   HGB 12.3 10.1* 10.5*  HCT 35.8* 29.4* 30.8*  MCV 97.5 98.7 98.1  PLT 57* 45* 50*   Cardiac Enzymes: No results for input(s): CKTOTAL, CKMB, CKMBINDEX, TROPONINI in the last 168 hours. BNP (last 3 results)  Recent Labs  08/09/14 1459  BNP 178.0*    ProBNP (last 3 results) No results for input(s): PROBNP in the last 8760 hours.  CBG:  Recent Labs Lab 01/25/15 0723 01/25/15 1134 01/25/15 1631 01/25/15 2111 01/26/15 0712  GLUCAP 232* 126* 145* 135* 146*    Recent Results (from the past 240 hour(s))  Culture, blood (routine x 2)     Status: None (Preliminary result)   Collection Time: 01/24/15  9:18 PM  Result Value Ref Range Status   Specimen Description BLOOD RIGHT ANTECUBITAL  Final   Special Requests BOTTLES DRAWN AEROBIC AND ANAEROBIC 10CC EACH  Final   Culture  Setup Time   Final    GRAM POSITIVE COCCI IN PAIRS RECOVERED FROM BOTH THE ANAEROBIC AND AEROBIC BOTTLES. Gram Stain Report Called to,Read Back By  and Verified With: GRAY,M. AT 1245 ON 01/25/2015 BY BAUGHAM,M. Performed at [facility]    Culture PENDING  Incomplete   Report Status PENDING  Incomplete  Culture, blood (routine x 2)     Status: None (Preliminary result)   Collection Time: 01/24/15  9:32 PM  Result Value Ref Range Status   Specimen Description BLOOD LEFT HAND  Final   Special Requests BOTTLES DRAWN AEROBIC ONLY AEB 4CC  Final   Culture NO GROWTH < 12 HOURS  Final   Report Status PENDING  Incomplete  MRSA PCR Screening     Status: None   Collection Time: 01/25/15 12:05 AM  Result Value Ref Range Status   MRSA by PCR NEGATIVE NEGATIVE Final    Comment:         The GeneXpert MRSA Assay (FDA approved for NASAL specimens only), is one component of a comprehensive MRSA colonization surveillance program. It is not intended to diagnose MRSA infection nor to guide or monitor treatment for MRSA infections.   Urine culture     Status: None   Collection Time: 01/25/15  9:49 AM  Result Value Ref Range Status   Specimen Description URINE, CATHETERIZED  Final   Special Requests NONE  Final   Culture   Final    NO GROWTH 1 DAY Performed at [facility]    Report Status 01/26/2015 FINAL  Final     Studies: Dg Chest 2 View  01/24/2015  CLINICAL DATA:  59 year old female with acute fever and back pain today. EXAM: CHEST  2 VIEW COMPARISON:  08/09/2014 FINDINGS: The cardiomediastinal silhouette is unremarkable. Mild patchy bibasilar opacities are noted and suspicious for infection/ pneumonia. There is no evidence of pneumothorax, definite pleural effusion or suspicious nodule. No acute bony abnormalities are identified. IMPRESSION: Patchy bibasilar opacities suspicious for pneumonia/infection. Electronically Signed   By: Harmon Pier M.D.   On: 01/24/2015 22:09   US Abdomen Limited  01/25/2015  CLINICAL DATA:  Evaluate for ascites for paracentesis. Fever and chills. Cirrhosis. EXAM: LIMITED ABDOMEN ULTRASOUND FOR ASCITES TECHNIQUE: Limited ultrasound survey for ascites was performed in all four abdominal quadrants. COMPARISON:  Abdominal ultrasound 11/04/2014 FINDINGS: All 4 quadrants of the abdomen were evaluated. No ascites identified. IMPRESSION: No ascites identified. Electronically Signed   By: Annia Belt M.D.   On: 01/25/2015 13:32   Dg Shoulder Left Port  01/25/2015  CLINICAL DATA:  Left shoulder pain for 3 days with no known injury EXAM: LEFT SHOULDER - 1 VIEW COMPARISON:  None. FINDINGS: No fracture or dislocation.  Mild acromioclavicular arthritis. IMPRESSION: No acute findings Electronically Signed   By: Esperanza Heir M.D.   On:  01/25/2015 14:41    Scheduled Meds: . diclofenac sodium  2 g Topical TID  . feeding supplement (ENSURE ENLIVE)  237 mL Oral QODAY  . Influenza vac split quadrivalent PF  0.5 mL Intramuscular Tomorrow-1000  . insulin aspart  0-5 Units Subcutaneous QHS  . insulin aspart  0-9 Units Subcutaneous TID WC  . lactulose  30 g Oral TID  . pantoprazole  80 mg Oral Daily  . piperacillin-tazobactam (ZOSYN)  IV  3.375 g Intravenous Q8H  . pneumococcal 23 valent vaccine  0.5 mL Intramuscular Tomorrow-1000  . rifaximin  550 mg Oral BID  . vancomycin  1,250 mg Intravenous Q24H   Continuous Infusions: . 0.9 % NaCl with KCl 20 mEq / L 75 mL/hr at 01/26/15 0800    Assessment and plan:  Principal Problem:  Community acquired pneumonia Active Problems:   Sepsis (HCC)   Gram-positive bacteremia   Acute renal injury (HCC)   Peripheral edema   Alcoholic cirrhosis (HCC)   Hyponatremia   Bilirubinemia   Thrombocytopenia (HCC)   Hypokalemia   Hyperglycemia   Anemia of chronic disease  1. Bibasilar pneumonia.  Patient's chest x-ray on admission revealed patchy bibasilar opacities suspicious for pneumonia. Her white blood cell count was 13.8 on admission. She was febrile with a temperature of 101.8. Blood cultures were ordered and she was started on vancomycin and Zosyn. -Her white blood cell count increased to 23.4; this may be due to demargination after antibiotics were started, but it has decreased down to 14.4. -Strep pneumo antigen and HIV antibody were negative. Legionella antigen pending. One out of 2  Blood cultures positive for gram-positive cocci, identification pending.  -Continue current management as she appears to be improving slowly.  Sepsis, secondary to pneumonia. Gram-positive bacteremia (1 out of 2 cultures positive, query contaminant). The patient's blood pressure was in the 80s systolically on admission, but I doubt septic shock due to her volume depletion and quick improvement  with IV fluids. Her lactic acid was 3.1 on admission. Her procalcitonin level was 3.15. - She was started on vigorous IV fluids with improvement in her blood pressure. -One out of 2 blood cultures is positive for gram-positive cocci-query true bacteremia versus contaminant. Will await further information.  -We'll continue IV fluids, but will decrease the rate again as tolerated due to her complaints of peripheral edema.  Hyperglycemia. Patient has no history of diabetes mellitus, but her glucose was 153 on admission. This may have been from the stress of infections. -Sliding-scale NovoLog was ordered on admission. -Hemoglobin A1c was 5.0. We'll discontinue sliding scale NovoLog. Will restart it if her blood sugar is consistently above 150. -We'll continue to monitor.  Alcoholic cirrhosis with associated coagulopathy, hyperbilirubinemia, mildly elevated ammonia, thrombocytopenia, etc. Patient reported that she stopped drinking in May of this year. She is treated chronically with Lasix, lactulose, Zaroxolyn, and spironolactone. Apparently, she was also taking rifaximin. She is followed by gastroenterologist, Dr. Karilyn Cotaehman. -Her ammonia level is slightly elevated, but she does not appear to be encephalopathic. Abdominal ultrasound revealed no ascites. -Her diuretics were held due to her volume depletion and soft blood pressures, but the patient is complaining of peripheral edema which appears to be no worse than one day ago.  -We'll decrease the IV fluids and restart smaller doses of spironolactone and Lasix if her blood pressure tolerates it. -Dr. Karilyn Cotaehman  consulted and he restarted rifaximin. -Her platelet count is in the 50,000 range. Her INR is greater than 3.  Peripheral edema. The patient does have evidence of mild peripheral edema, likely from severe cirrhosis and hypoalbuminemia. -We'll order 2-D echocardiogram to assess for diastolic, right heart, or left heart failure. -We will decrease the  IV fluid rate little and restart smaller doses of her diuretics.  Acute kidney injury. Patient has no known history of chronic kidney disease per chart review. Her creatinine on 01/17/15 was within normal limits. -Her creatinine was 1.13 on admission and decreased with IV fluids. -We'll continue gentle IV fluids.  Hyponatremia. The patient has chronic hyponatremia from her cirrhosis. Her serum sodium was 128 on admission. Her baseline appears to be around 129-131. -Following IV fluids, it has fell to 126, but improved to 130 following decrease in IV fluid rate.  Hypokalemia Patient serum potassium was 3.3 on admission. This was likely secondary  to diuretic therapy. -Potassium chloride was added to the IV fluids and she was started on oral potassium. We'll continue potassium supplementation.  Normocytic anemia. -Patient probably has anemia of chronic disease superimposed on a history of GI bleeding in April 2016. -Her hemoglobin appears to be slightly below baseline, likely attributed to hemodilution from IV fluids. -We'll continue to monitor.  Left shoulder pain. The patient complains of left shoulder pain which has been present for a couple of months. She denies any recent falls. X-ray of her shoulder reveals no fracture or significant DJD. On exam, she does have some tenderness. -Topical Voltaren was ordered. Will add when necessary oxycodone. -We'll order PT for range of motion exercises.      Time spent: 40 minutes.     [facility]  [facility] Pager [phone] . If 7PM-7AM, please contact night-coverage at [email or web address], password [facility] 01/26/2015, 9:26 AM  LOS: 2 days

## 2015-01-27 ENCOUNTER — Inpatient Hospital Stay (HOSPITAL_COMMUNITY): Payer: Medicare Other

## 2015-01-27 DIAGNOSIS — I519 Heart disease, unspecified: Secondary | ICD-10-CM

## 2015-01-27 DIAGNOSIS — I272 Pulmonary hypertension, unspecified: Secondary | ICD-10-CM

## 2015-01-27 DIAGNOSIS — I5189 Other ill-defined heart diseases: Secondary | ICD-10-CM

## 2015-01-27 HISTORY — DX: Pulmonary hypertension, unspecified: I27.20

## 2015-01-27 HISTORY — DX: Other ill-defined heart diseases: I51.89

## 2015-01-27 LAB — COMPREHENSIVE METABOLIC PANEL
ALT: 30 U/L (ref 14–54)
ANION GAP: 5 (ref 5–15)
AST: 48 U/L — AB (ref 15–41)
Albumin: 1.5 g/dL — ABNORMAL LOW (ref 3.5–5.0)
Alkaline Phosphatase: 185 U/L — ABNORMAL HIGH (ref 38–126)
BUN: 26 mg/dL — AB (ref 6–20)
CHLORIDE: 100 mmol/L — AB (ref 101–111)
CO2: 27 mmol/L (ref 22–32)
Calcium: 7.6 mg/dL — ABNORMAL LOW (ref 8.9–10.3)
Creatinine, Ser: 1.15 mg/dL — ABNORMAL HIGH (ref 0.44–1.00)
GFR calc Af Amer: 60 mL/min — ABNORMAL LOW (ref >60)
GFR calc non Af Amer: 51 mL/min — ABNORMAL LOW (ref >60)
GLUCOSE: 142 mg/dL — AB (ref 65–99)
POTASSIUM: 3.6 mmol/L (ref 3.5–5.1)
Sodium: 132 mmol/L — ABNORMAL LOW (ref 135–145)
TOTAL PROTEIN: 4.7 g/dL — AB (ref 6.5–8.1)
Total Bilirubin: 5.5 mg/dL — ABNORMAL HIGH (ref 0.3–1.2)

## 2015-01-27 LAB — CBC
HCT: 30.5 % — ABNORMAL LOW (ref 36.0–46.0)
HEMOGLOBIN: 10.3 g/dL — AB (ref 12.0–15.0)
MCH: 33.2 pg (ref 26.0–34.0)
MCHC: 33.8 g/dL (ref 30.0–36.0)
MCV: 98.4 fL (ref 78.0–100.0)
PLATELETS: 48 10*3/uL — AB (ref 150–400)
RBC: 3.1 MIL/uL — ABNORMAL LOW (ref 3.87–5.11)
RDW: 16.4 % — ABNORMAL HIGH (ref 11.5–15.5)
WBC: 10.4 10*3/uL (ref 4.0–10.5)

## 2015-01-27 LAB — PROTIME-INR
INR: 3.16 — ABNORMAL HIGH (ref 0.00–1.49)
Prothrombin Time: 31.9 seconds — ABNORMAL HIGH (ref 11.6–15.2)

## 2015-01-27 LAB — GLUCOSE, CAPILLARY: Glucose-Capillary: 152 mg/dL — ABNORMAL HIGH (ref 65–99)

## 2015-01-27 LAB — LEGIONELLA PNEUMOPHILA SEROGP 1 UR AG: L. PNEUMOPHILA SEROGP 1 UR AG: NEGATIVE

## 2015-01-27 MED ORDER — ALBUMIN HUMAN 25 % IV SOLN
50.0000 g | Freq: Once | INTRAVENOUS | Status: AC
  Administered 2015-01-29: 50 g via INTRAVENOUS
  Filled 2015-01-27: qty 200

## 2015-01-27 MED ORDER — CLINDAMYCIN HCL 150 MG PO CAPS
450.0000 mg | ORAL_CAPSULE | Freq: Four times a day (QID) | ORAL | Status: DC
  Administered 2015-01-27 – 2015-01-29 (×6): 450 mg via ORAL
  Filled 2015-01-27 (×6): qty 3

## 2015-01-27 MED ORDER — POTASSIUM CHLORIDE CRYS ER 20 MEQ PO TBCR
20.0000 meq | EXTENDED_RELEASE_TABLET | Freq: Every day | ORAL | Status: DC
  Administered 2015-01-27 – 2015-01-29 (×3): 20 meq via ORAL
  Filled 2015-01-27 (×3): qty 1

## 2015-01-27 NOTE — Care Management Important Message (Signed)
Important Message  Patient Details  Name: Marissa Figueroa MRN: [id number] Date of Birth: Dec 10, 1955   Medicare Important Message Given:  Yes-second notification given    Malcolm MetroChildress, Nori Winegar Demske, RN 01/27/2015, 4:36 PM

## 2015-01-27 NOTE — Progress Notes (Signed)
[facility] PROGRESS NOTE  Marissa Figueroa ZOX:[id number] DOB: 29-Mar-1956 DOA: 01/24/2015 PCP: Ignatius Specking., MD    Code Status: Full code Family Communication: Discussed with patient; family not available Disposition Plan: Discharge to home when clinically appropriate; possibly in 24-48 hours.   Consultants:  Gastroenterology : Dr. Karilyn Cota  Procedures:  Echo 01/26/15:Study Conclusions - Left ventricle: The cavity size was normal. Wall thickness was increased in a pattern of mild LVH. Systolic function was normal. The estimated ejection fraction was in the range of 60% to 65%. Wall motion was normal; there were no regional wall motion abnormalities. Doppler parameters are consistent with high ventricular filling pressure. - Aortic valve: Mildly calcified annulus. Trileaflet. - Mitral valve: Calcified annulus. There was trivial regurgitation. - Left atrium: The atrium was at the upper limits of normal in size. - Tricuspid valve: There was trivial regurgitation. - Pulmonary arteries: Systolic pressure was moderately increased. PA peak pressure: 52 mm Hg (S). - Pericardium, extracardiac: There was no pericardial effusion. Impressions: - Mild LVH with LVEF 60-65% and increased filling pressures. Upper normal left atrial chamber size. Trivial tricuspid regurgitation. Evidence of moderate pulmonary hypertension with PASP 52 mmHg.  Antibiotics:   Vancomycin 10/12>>  Zosyn 10/12>>  HPI/Subjective: Patient is breathing better. She feels slightly less swollen with the restart of her diuretics.  Objective: Filed Vitals:   01/27/15 0800  BP: 92/42  Pulse: 87  Temp:   Resp: 22   temperature 97.8. Oxygen saturation 95%  Intake/Output Summary (Last 24 hours) at 01/27/15 0941 Last data filed at 01/27/15 0500  Gross per 24 hour  Intake   3490 ml  Output   1700 ml  Net   1790 ml   Filed Weights   01/25/15 0500 01/26/15 0500 01/27/15 0500   Weight: 112.7 kg (248 lb 7.3 oz) 116.6 kg (257 lb 0.9 oz) 118.7 kg (261 lb 11 oz)    Exam:   General:  Obese ill-appearing 59 year old woman sitting up in bed, in no acute distress.  HEENT: Bilateral scleral icterus and conjunctival injection.  Cardiovascular: S1, S2, with a 1 to 2/6 systolic murmur.  Respiratory: Breathing nonlabored; rare crackles auscultated.  Abdomen: obese, positive bowel sounds, nontender, nondistended  Musculoskeletal: no acute hot red joints. 1+ pitting edema of the lower extremities extending up to her thighs bilaterally.  Neurologic: She is alert and oriented to herself, hospital, month, and year.   Data Reviewed: Basic Metabolic Panel:  Recent Labs Lab 01/24/15 2118 01/25/15 0453 01/26/15 0431 01/27/15 0441  NA 128* 126* 130* 132*  K 3.3* 3.3* 3.5 3.6  CL 94* 94* 98* 100*  CO2 GLUCOSE 153* 218* 137* 142*  BUN 23* 26* 28* 26*  CREATININE 1.13* 1.25* 1.06* 1.15*  CALCIUM 9.1 8.1* 7.9* 7.6*   Liver Function Tests:  Recent Labs Lab 01/24/15 2118 01/26/15 0431 01/27/15 0441  AST 58* 47* 48*  ALT 37 31 30  ALKPHOS 168* 140* 185*  BILITOT 10.7* 6.5* 5.5*  PROT 6.0* 4.7* 4.7*  ALBUMIN 1.9* 1.5* 1.5*    Recent Labs Lab 01/24/15 2118  LIPASE 54*    Recent Labs Lab 01/24/15 2118 01/26/15 0431  AMMONIA 50* 51*   CBC:  Recent Labs Lab 01/24/15 2118 01/25/15 0453 01/26/15 0431 01/27/15 0441  WBC 13.8* 23.4* 14.4* 10.4  NEUTROABS 12.4*  --   --   --   HGB 12.3 10.1* 10.5* 10.3*  HCT 35.8* 29.4* 30.8* 30.5*  MCV 97.5 98.7 98.1 98.4  PLT 57* 45* 50* 48*   Cardiac Enzymes: No results for input(s): CKTOTAL, CKMB, CKMBINDEX, TROPONINI in the last 168 hours. BNP (last 3 results)  Recent Labs  08/09/14 1459  BNP 178.0*    ProBNP (last 3 results) No results for input(s): PROBNP in the last 8760 hours.  CBG:  Recent Labs Lab 01/26/15 0712 01/26/15 1133 01/26/15 1543 01/26/15 2133 01/27/15 0744   GLUCAP 146* 167* 130* 140* 152*    Recent Results (from the past 240 hour(s))  Culture, blood (routine x 2)     Status: None (Preliminary result)   Collection Time: 01/24/15  9:18 PM  Result Value Ref Range Status   Specimen Description BLOOD RIGHT ANTECUBITAL  Final   Special Requests BOTTLES DRAWN AEROBIC AND ANAEROBIC 10CC EACH  Final   Culture  Setup Time   Final    GRAM POSITIVE COCCI IN PAIRS RECOVERED FROM BOTH THE ANAEROBIC AND AEROBIC BOTTLES. Gram Stain Report Called to,Read Back By and Verified With: GRAY,M. AT 1245 ON 01/25/2015 BY BAUGHAM,M. Performed at [facility]    Culture   Final    GRAM POSITIVE COCCI [facility]    Report Status PENDING  Incomplete  Culture, blood (routine x 2)     Status: None (Preliminary result)   Collection Time: 01/24/15  9:32 PM  Result Value Ref Range Status   Specimen Description BLOOD LEFT HAND DRAWN BY RN  Final   Special Requests BOTTLES DRAWN AEROBIC ONLY 4CC  Final   Culture  Setup Time   Final    GRAM POSITIVE COCCI IN CLUSTERS RECOVERED FROM THE AEROBIC BOTTLE Gram Stain Report Called to,Read Back By and Verified With: NEILSON,T. AT 0615 ON 01/26/2015 BY RESSEGGER,R. Performed at [facility]    Culture NO GROWTH 3 DAYS  Final   Report Status PENDING  Incomplete  MRSA PCR Screening     Status: None   Collection Time: 01/25/15 12:05 AM  Result Value Ref Range Status   MRSA by PCR NEGATIVE NEGATIVE Final    Comment:        The GeneXpert MRSA Assay (FDA approved for NASAL specimens only), is one component of a comprehensive MRSA colonization surveillance program. It is not intended to diagnose MRSA infection nor to guide or monitor treatment for MRSA infections.   Urine culture     Status: None   Collection Time: 01/25/15  9:49 AM  Result Value Ref Range Status   Specimen Description URINE, CATHETERIZED  Final   Special Requests NONE  Final   Culture   Final    NO GROWTH 1  DAY [facility]    Report Status 01/26/2015 FINAL  Final     Studies: [place] Abdomen Limited  01/25/2015  CLINICAL DATA:  Evaluate for ascites for paracentesis. Fever and chills. Cirrhosis. EXAM: LIMITED ABDOMEN ULTRASOUND FOR ASCITES TECHNIQUE: Limited ultrasound survey for ascites was performed in all four abdominal quadrants. COMPARISON:  Abdominal ultrasound 11/04/2014 FINDINGS: All 4 quadrants of the abdomen were evaluated. No ascites identified. IMPRESSION: No ascites identified. Electronically Signed   By: Annia Beltrew  Davis M.D.   On: 01/25/2015 13:32   Dg Shoulder Left Port  01/25/2015  CLINICAL DATA:  Left shoulder pain for 3 days with no known injury EXAM: LEFT SHOULDER - 1 VIEW COMPARISON:  None. FINDINGS: No fracture or dislocation.  Mild acromioclavicular arthritis. IMPRESSION: No acute findings Electronically Signed   By: Esperanza Heiraymond  Rubner M.D.   On:  01/25/2015 14:41    Scheduled Meds: . diclofenac sodium  2 g Topical TID  . feeding supplement (ENSURE ENLIVE)  237 mL Oral QODAY  . furosemide  20 mg Oral BID  . Influenza vac split quadrivalent PF  0.5 mL Intramuscular Tomorrow-1000  . lactulose  30 g Oral TID  . pantoprazole  80 mg Oral Daily  . piperacillin-tazobactam (ZOSYN)  IV  3.375 g Intravenous Q8H  . rifaximin  550 mg Oral BID  . spironolactone  25 mg Oral Daily  . vancomycin  750 mg Intravenous Q12H   Continuous Infusions: . 0.9 % NaCl with KCl 20 mEq / L 65 mL/hr at 01/27/15 0300    Assessment and plan:  Principal Problem:   Community acquired pneumonia Active Problems:   Sepsis (HCC)   Gram-positive bacteremia   Acute renal injury (HCC)   Peripheral edema   Diastolic dysfunction   Pulmonary hypertension (HCC)   Alcoholic cirrhosis (HCC)   Hyponatremia   Bilirubinemia   Thrombocytopenia (HCC)   Hypokalemia   Hyperglycemia   Anemia of chronic disease  1. Bibasilar pneumonia.  Patient's chest x-ray on admission revealed patchy bibasilar  opacities suspicious for pneumonia. Her white blood cell count was 13.8 on admission. She was febrile with a temperature of 101.8. Blood cultures were ordered and she was started on vancomycin and Zosyn. -Her white blood cell count increased to 23.4; this may have been due to demargination after antibiotics were started. -Strep pneumo antigen and HIV antibody were negative. Legionella antigen pending.  -Blood cultures 2 positive for gram-positive cocci. Identification and sensitivities pending.  -Continue current management as she appears to be improving slowly. -Consider narrowing antibiotics in the next 24 hours. -Follow-up chest x-ray pending. Sepsis, secondary to pneumonia; Gram-positive bacteremia  The patient's blood pressure was in the 80s systolically on admission, but I doubt septic shock due to her volume depletion and quick improvement with IV fluids. Her lactic acid was 3.1 on admission. Her procalcitonin level was 3.15. - She was started on vigorous IV fluids with improvement in her blood pressure. -Blood cultures 2 now positive for gram-positive cocci. Will await further information.  -We'll continue IV fluids, but will decrease the rate again as her blood pressures better and she still has some peripheral edema. Hyperglycemia. Patient has no history of diabetes mellitus, but her glucose was 153 on admission. This may have been from the stress of infections. -Sliding-scale NovoLog was ordered on admission; but was discontinued when her hemoglobin A1c was 5.0. - Will restart NovoLog it if her blood sugar is consistently above 150. Alcoholic cirrhosis with associated coagulopathy, hyperbilirubinemia, mildly elevated ammonia, thrombocytopenia, etc. Patient reported that she stopped drinking in May of this year. She is treated chronically with Lasix, lactulose, Zaroxolyn, and spironolactone. Apparently, she was also taking rifaximin. She is followed by gastroenterologist, Dr.  Karilyn Cota. -Her ammonia level is slightly elevated, but she does not appear to be encephalopathic. Abdominal ultrasound revealed no ascites. -Her diuretics were held due to her volume depletion and soft blood pressures, but the patient complained of of peripheral edema so IV fluids were decreased and a smaller dose of spironolactone and Lasix were restarted on 10/13. Can titrate up in the next day or 2 if her blood pressure remained stable. -Dr. Karilyn Cota  consulted and he restarted rifaximin. -Her platelet count is in the 50,000 range. Her INR is greater than 3. -Her total bilirubin is trending downward. Her LFTs are minimally elevated. Peripheral edema, secondary to  cirrhosis and mild diastolic CHF exacerbation. The patient does have evidence of mild peripheral edema, likely from severe cirrhosis and hypoalbuminemia. However, patient's 2-D echocardiogram revealed EF of 60-65%, diastolic dysfunction and pulmonary hypertension which are likely contributing. -IV fluids decreased. Lasix and spironolactone restarted at lower doses, will consider increasing the doses back to home doses gradually. -Follow-up chest x-ray pending. Acute kidney injury. Patient has no known history of chronic kidney disease per chart review. Her creatinine on 01/17/15 was within normal limits. -Her creatinine was 1.13 on admission; decreased/increased some, but mostly stable. -We'll adjust IV fluids with mild titration down. Hyponatremia. The patient has chronic hyponatremia from her cirrhosis. Her serum sodium was 128 on admission. Her baseline appears to be around 129-131. -Following IV fluids, it has fell to 126, but improved to 132 following decrease in IV fluid rate. Hypokalemia Patient serum potassium was 3.3 on admission. This was likely secondary to diuretic therapy. -Potassium chloride was added to the IV fluids and she was started on oral potassium. We'll continue potassium supplementation. Potassium  better. Normocytic anemia. -Patient probably has anemia of chronic disease superimposed on a history of GI bleeding in April 2016. -Her hemoglobin appears to be slightly below baseline, likely attributed to hemodilution from IV fluids. -We'll continue to monitor. Left shoulder pain. The patient complains of left shoulder pain which has been present for a couple of months. She denies any recent falls. X-ray of her shoulder reveals no fracture or significant DJD. On exam, she does have some tenderness. -Topical Voltaren was ordered.  When necessary oxycodone and PT ordered.    We'll transfer out of the ICU. Continue out of bed to the chair and ambulation as tolerated.   Time spent: 40 minutes.     [facility]  [facility] Pager [phone] . If 7PM-7AM, please contact night-coverage at [email or web address], password [facility] 01/27/2015, 9:41 AM  LOS: 3 days

## 2015-01-27 NOTE — Progress Notes (Signed)
Report called to Christena DeemKaren Wood on 300. Patient transported via wheelchair to 300.

## 2015-01-27 NOTE — Progress Notes (Signed)
Arrived from ICU via wheelchair, foley catheter intact, IV fluid infusing via pump, assisted X 1 from wheelchair to bed.  Alert and oriented,  Pitting edema from hips to feet.  Lungs diminished with dry cough.  Sclerae jaundiced.

## 2015-01-27 NOTE — Progress Notes (Signed)
Patient not happy with lunch tray delivered, wanted what she ordered when patient in ICU, kitchen staff in room and will bring what patient requested.  Offered to call Dr. Sherrie MustacheFisher for nicotine patch, patient refused stating she had not smoked in about 2 weeks.

## 2015-01-27 NOTE — Progress Notes (Signed)
IV out.  Refused to be restuck.  Dr. Karilyn Cotaehman to room and ordered IV albumin.  Patient agreeable to be stuck only once.

## 2015-01-27 NOTE — Progress Notes (Signed)
  Subjective:  Patient complains of problems with IV. She states she had to be stuck multiple times. She is anxious to go home. She states she has good appetite. She denies nausea vomiting but complains of abdominal bloating and worsening lower extremity edema. Her bowels are moving. She denies melena or rectal bleeding.    Objective: Blood pressure 96/44, pulse 77, temperature 97.4 F (36.3 C), temperature source Oral, resp. rate 18, height 5\' 8"  (1.727 m), weight 261 lb 11 oz (118.7 kg), SpO2 100 %. Patient is alert and in no acute distress. She does not have asterixis. She is jaundiced. Abdomen is full. She has 3+ pitting edema involving both legs. .  Labs/studies Results:   Recent Labs  01/25/15 0453 01/26/15 0431 01/27/15 0441  WBC 23.4* 14.4* 10.4  HGB 10.1* 10.5* 10.3*  HCT 29.4* 30.8* 30.5*  PLT 45* 50* 48*    BMET   Recent Labs  01/25/15 0453 01/26/15 0431 01/27/15 0441  NA 126* 130* 132*  K 3.3* 3.5 3.6  CL 94* 98* 100*  CO2 25 27 27   GLUCOSE 218* 137* 142*  BUN 26* 28* 26*  CREATININE 1.25* 1.06* 1.15*  CALCIUM 8.1* 7.9* 7.6*    LFT   Recent Labs  01/24/15 2118 01/26/15 0431 01/27/15 0441  PROT 6.0* 4.7* 4.7*  ALBUMIN 1.9* 1.5* 1.5*  AST 58* 47* 48*  ALT 37 31 30  ALKPHOS 168* 140* 185*  BILITOT 10.7* 6.5* 5.5*    PT/INR   Recent Labs  01/26/15 0431 01/27/15 0441  LABPROT 32.9* 31.9*  INR 3.30* 3.16*     Assessment:  #1. Fluid overload merrily involving lower extremities and she also has tissue edema but no ascites as documented on ultrasound 2 days ago. She may benefit from IV albumin since serum albumin is very low. #2. Cholestatic alcoholic liver disease. She has not had any alcohol in 6 months but hepatic function has not improved. She was also treated with prednisone for over 4 weeks. In spite of severe liver disease she is tolerating this acute illness quite well. #3. Anemia thrombocytopenia and coagulopathy secondary to  chronic liver disease. #4. Hepatic encephalopathy.   Recommendations:  Albumin 50 g IV today and repeat in a.m.

## 2015-01-27 NOTE — Evaluation (Signed)
Occupational Therapy Evaluation Patient Details Name: Marissa Figueroa MRN: [id number] DOB: Feb 19, 1956 Today's Date: 01/27/2015    History of Present Illness Pt is a 59 y/o female with a history of alcoholic cirrhosis of the liver on lactulose, congestive heart failure on 3 diuretics (Lasix, spironolactone, metaxalone). The patient presents to the emergency department with intermittent fevers and chills over the past 48 hours. Additionally, the patient has had cough with productive sputum over the past few days. Her cough has been getting worse. No provoking or palliating factors. Her temperature today at home was 102.8. She denies alcohol intake   Clinical Impression   Pt awake and alert this am, complaining of pain in left anterior shoulder. Pt reports pain has been present "for a while" unable to give definitive timeline. 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 Completed shoulder PROM exercises, as pt was unable to complete AA/ROM exercises due to pain and weakness. Educated pt on use of shoulder and completing exercises provided by PT yesterday. Recommend pt follow up with PCP and/or orthopedic MD if interested in further assessing origin of shoulder pain. Will continue to see pt in acute care for ROM exercises, no OT follow-up care on discharge as pt is at baseline with ADL tasks and has assistance available at home.    Follow Up Recommendations  No OT follow up;Supervision/Assistance - 24 hour    Equipment Recommendations  None recommended by OT       Precautions / Restrictions Precautions Precautions: None Restrictions Weight Bearing Restrictions: No      Mobility Bed Mobility               General bed mobility comments: not tested  Transfers                 General transfer comment: not tested         ADL Overall ADL's : Needs assistance/impaired                                        General ADL Comments: Pt requires assistance in bathing and dressing tasks-which daughter is available to assist with at home     Vision Vision Assessment?: No apparent visual deficits          Pertinent Vitals/Pain Pain Assessment: Faces Faces Pain Scale: Hurts even more Pain Location: left shoulder Pain Descriptors / Indicators: Constant;Throbbing Pain Intervention(s): Limited activity within patient's tolerance;Monitored during session     Hand Dominance Right   Extremity/Trunk Assessment Upper Extremity Assessment Upper Extremity Assessment: LUE deficits/detail LUE Deficits / Details: PROM painful, tolerates approximately 50% range, strength 2/5 LUE: Unable to fully assess due to pain   Lower Extremity Assessment Lower Extremity Assessment: Defer to PT evaluation       Communication Communication Communication: No difficulties   Cognition Arousal/Alertness: Awake/alert Behavior During Therapy: WFL for tasks assessed/performed Overall Cognitive Status: Within Functional Limits for tasks assessed                        Exercises Exercises: Shoulder      01/27/15 0900  Shoulder Exercises  Shoulder Flexion PROM;Left;10 reps;Supine  Shoulder ABduction PROM;Left;10 reps;Supine  Shoulder External Rotation PROM;Left;10 reps;Supine  Elbow Flexion PROM;Left;10 reps;Supine  Elbow Extension PROM;10 reps;Left;Supine          Home Living Family/patient expects to  be discharged to:: Private residence Living Arrangements: Children Available Help at Discharge: Family;Available 24 hours/day Type of Home: Apartment             Bathroom Shower/Tub: Chief Strategy OfficerTub/shower unit   Bathroom Toilet: Standard     Home Equipment: Environmental consultantWalker - 4 wheels;Bedside commode;Shower seat          Prior Functioning/Environment Level of Independence: Needs assistance    ADL's / Homemaking Assistance Needed: Pt requires assistance with bathing and household tasks.         OT  Diagnosis: Acute pain;Generalized weakness   OT Problem List: Decreased strength;Decreased range of motion;Pain;Impaired UE functional use   OT Treatment/Interventions: Self-care/ADL training;Therapeutic exercise;Therapeutic activities;Patient/family education    OT Goals(Current goals can be found in the care plan section) Acute Rehab OT Goals Patient Stated Goal: wants to go home OT Goal Formulation: With patient Time For Goal Achievement: 02/10/15 Potential to Achieve Goals: Good  OT Frequency: Min 2X/week    End of Session    Activity Tolerance: Patient limited by pain Patient left: in bed;with call bell/phone within reach;with bed alarm set;with nursing/sitter in room   Time: 0821-0852 OT Time Calculation (min): 31 min Charges:  OT General Charges $OT Visit: 1 Procedure OT Evaluation $Initial OT Evaluation Tier I: 1 Procedure OT Treatments $Therapeutic Exercise: 8-22 mins  Ezra SitesLeslie Troxler, OTR/L  [phone]  01/27/2015, 9:24 AM

## 2015-01-28 DIAGNOSIS — R7881 Bacteremia: Secondary | ICD-10-CM

## 2015-01-28 DIAGNOSIS — D638 Anemia in other chronic diseases classified elsewhere: Secondary | ICD-10-CM

## 2015-01-28 LAB — COMPREHENSIVE METABOLIC PANEL
ALK PHOS: 202 U/L — AB (ref 38–126)
ALT: 34 U/L (ref 14–54)
ANION GAP: 5 (ref 5–15)
AST: 51 U/L — ABNORMAL HIGH (ref 15–41)
Albumin: 1.6 g/dL — ABNORMAL LOW (ref 3.5–5.0)
BILIRUBIN TOTAL: 5.8 mg/dL — AB (ref 0.3–1.2)
BUN: 21 mg/dL — ABNORMAL HIGH (ref 6–20)
CO2: 28 mmol/L (ref 22–32)
CREATININE: 0.93 mg/dL (ref 0.44–1.00)
Calcium: 7.8 mg/dL — ABNORMAL LOW (ref 8.9–10.3)
Chloride: 100 mmol/L — ABNORMAL LOW (ref 101–111)
GFR calc non Af Amer: >60 mL/min (ref >60)
GLUCOSE: 139 mg/dL — AB (ref 65–99)
Potassium: 3.4 mmol/L — ABNORMAL LOW (ref 3.5–5.1)
Sodium: 133 mmol/L — ABNORMAL LOW (ref 135–145)
Total Protein: 5 g/dL — ABNORMAL LOW (ref 6.5–8.1)

## 2015-01-28 LAB — CBC
HEMATOCRIT: 33.1 % — AB (ref 36.0–46.0)
HEMOGLOBIN: 11.2 g/dL — AB (ref 12.0–15.0)
MCH: 33.2 pg (ref 26.0–34.0)
MCHC: 33.8 g/dL (ref 30.0–36.0)
MCV: 98.2 fL (ref 78.0–100.0)
Platelets: 50 10*3/uL — ABNORMAL LOW (ref 150–400)
RBC: 3.37 MIL/uL — ABNORMAL LOW (ref 3.87–5.11)
RDW: 16.5 % — ABNORMAL HIGH (ref 11.5–15.5)
WBC: 7.9 10*3/uL (ref 4.0–10.5)

## 2015-01-28 LAB — PROTIME-INR
INR: 2.89 — AB (ref 0.00–1.49)
Prothrombin Time: 29.7 seconds — ABNORMAL HIGH (ref 11.6–15.2)

## 2015-01-28 MED ORDER — VITAMIN K1 10 MG/ML IJ SOLN
10.0000 mg | Freq: Once | INTRAMUSCULAR | Status: AC
  Administered 2015-01-28: 10 mg via SUBCUTANEOUS
  Filled 2015-01-28: qty 1

## 2015-01-28 NOTE — Progress Notes (Signed)
Patient refused to have IV replacement. MD notified. IV antibiotics will be held tonight, orders updated and followed. Will continue to monitor patient.

## 2015-01-28 NOTE — Progress Notes (Signed)
Patient requested to go outside to "get some air". Explained to patient that her legs were edematous and foley catheter intact.  Patient stated "I am going outside to get myself together."  Discussed with Karl LukeLisa Covington, RN, and also [facility]  Patient is also scheduled for MRI of left shoulder.  No IV access.  Patient becoming agitated about going outside.  Dr. Kerry HoughMemon e-paged.

## 2015-01-28 NOTE — Progress Notes (Signed)
[facility] PROGRESS NOTE  Marissa Figueroa ZOX:[id number] DOB: 1955-11-28 DOA: 01/24/2015 PCP: Ignatius Specking., MD    Code Status: Full code Family Communication: Discussed with patient; family not available Disposition Plan: Discharge to home when clinically appropriate; possibly in 24-48 hours.   Consultants:  Gastroenterology : Dr. Karilyn Cota  Procedures:  Echo 01/26/15:Study Conclusions - Left ventricle: The cavity size was normal. Wall thickness was increased in a pattern of mild LVH. Systolic function was normal. The estimated ejection fraction was in the range of 60% to 65%. Wall motion was normal; there were no regional wall motion abnormalities. Doppler parameters are consistent with high ventricular filling pressure. - Aortic valve: Mildly calcified annulus. Trileaflet. - Mitral valve: Calcified annulus. There was trivial regurgitation. - Left atrium: The atrium was at the upper limits of normal in size. - Tricuspid valve: There was trivial regurgitation. - Pulmonary arteries: Systolic pressure was moderately increased. PA peak pressure: 52 mm Hg (S). - Pericardium, extracardiac: There was no pericardial effusion. Impressions: - Mild LVH with LVEF 60-65% and increased filling pressures. Upper normal left atrial chamber size. Trivial tricuspid regurgitation. Evidence of moderate pulmonary hypertension with PASP 52 mmHg.  Antibiotics:   Vancomycin 10/12>>  Zosyn 10/12>>10/15  HPI/Subjective: Continues to complain of pain in her left shoulder with difficulty raising it. No shortness of breath or cough at this time.  Objective: Filed Vitals:   01/28/15 0429  BP: 104/55  Pulse: 85  Temp: 98.5 F (36.9 C)  Resp: 18   temperature 97.8. Oxygen saturation 95%  Intake/Output Summary (Last 24 hours) at 01/28/15 1102 Last data filed at 01/28/15 0833  Gross per 24 hour  Intake    360 ml  Output   2800 ml  Net  -2440 ml   Filed  Weights   01/26/15 0500 01/27/15 0500 01/28/15 0429  Weight: 116.6 kg (257 lb 0.9 oz) 118.7 kg (261 lb 11 oz) 117.663 kg (259 lb 6.4 oz)    Exam:   General:  Obese ill-appearing 59 year old woman sitting up in bed, in no acute distress.  HEENT: Bilateral scleral icterus and conjunctival injection.  Cardiovascular: S1, S2, with a 1 to 2/6 systolic murmur.  Respiratory: Breathing nonlabored; CTA B.  Abdomen: obese, positive bowel sounds, nontender, nondistended  Musculoskeletal: left shoulder appears to have joint effusion. 2+ pitting edema of the lower extremities extending up to her thighs bilaterally.  Neurologic: She is alert and oriented to herself, hospital, month, and year.   Data Reviewed: Basic Metabolic Panel:  Recent Labs Lab 01/24/15 2118 01/25/15 0453 01/26/15 0431 01/27/15 0441 01/28/15 0551  NA 128* 126* 130* 132* 133*  K 3.3* 3.3* 3.5 3.6 3.4*  CL 94* 94* 98* 100* 100*  CO2 GLUCOSE 153* 218* 137* 142* 139*  BUN 23* 26* 28* 26* 21*  CREATININE 1.13* 1.25* 1.06* 1.15* 0.93  CALCIUM 9.1 8.1* 7.9* 7.6* 7.8*   Liver Function Tests:  Recent Labs Lab 01/24/15 2118 01/26/15 0431 01/27/15 0441 01/28/15 0551  AST 58* 47* 48* 51*  ALT 37 31 30 34  ALKPHOS 168* 140* 185* 202*  BILITOT 10.7* 6.5* 5.5* 5.8*  PROT 6.0* 4.7* 4.7* 5.0*  ALBUMIN 1.9* 1.5* 1.5* 1.6*    Recent Labs Lab 01/24/15 2118  LIPASE 54*    Recent Labs Lab 01/24/15 2118 01/26/15 0431  AMMONIA 50* 51*   CBC:  Recent Labs Lab 01/24/15 2118 01/25/15 0453 01/26/15 0431 01/27/15 0441 01/28/15 0551  WBC 13.8* 23.4* 14.4*  10.4 7.9  NEUTROABS 12.4*  --   --   --   --   HGB 12.3 10.1* 10.5* 10.3* 11.2*  HCT 35.8* 29.4* 30.8* 30.5* 33.1*  MCV 97.5 98.7 98.1 98.4 98.2  PLT 57* 45* 50* 48* 50*   Cardiac Enzymes: No results for input(s): CKTOTAL, CKMB, CKMBINDEX, TROPONINI in the last 168 hours. BNP (last 3 results)  Recent Labs  08/09/14 1459  BNP 178.0*     ProBNP (last 3 results) No results for input(s): PROBNP in the last 8760 hours.  CBG:  Recent Labs Lab 01/26/15 0712 01/26/15 1133 01/26/15 1543 01/26/15 2133 01/27/15 0744  GLUCAP 146* 167* 130* 140* 152*    Recent Results (from the past 240 hour(s))  Culture, blood (routine x 2)     Status: None   Collection Time: 01/24/15  9:18 PM  Result Value Ref Range Status   Specimen Description BLOOD RIGHT ANTECUBITAL  Final   Special Requests BOTTLES DRAWN AEROBIC AND ANAEROBIC 10CC EACH  Final   Culture  Setup Time   Final    GRAM POSITIVE COCCI IN PAIRS RECOVERED FROM BOTH THE ANAEROBIC AND AEROBIC BOTTLES. Gram Stain Report Called to,Read Back By and Verified With: GRAY,M. AT 1245 ON 01/25/2015 BY BAUGHAM,M. Performed at [facility]    Culture   Final    STAPHYLOCOCCUS SPECIES (COAGULASE NEGATIVE) SUSCEPTIBILITIES PERFORMED ON PREVIOUS CULTURE WITHIN THE LAST 5 DAYS. Performed at [facility]    Report Status 01/28/2015 FINAL  Final   Organism ID, Bacteria STAPHYLOCOCCUS SPECIES (COAGULASE NEGATIVE)  Final      Susceptibility   Staphylococcus species (coagulase negative) - MIC*    CIPROFLOXACIN >=8 RESISTANT Resistant     ERYTHROMYCIN >=8 RESISTANT Resistant     GENTAMICIN <=0.5 SENSITIVE Sensitive     OXACILLIN >=4 RESISTANT Resistant     TETRACYCLINE >=16 RESISTANT Resistant     VANCOMYCIN 2 SENSITIVE Sensitive     TRIMETH/SULFA 80 RESISTANT Resistant     CLINDAMYCIN <=0.25 SENSITIVE Sensitive     RIFAMPIN <=0.5 SENSITIVE Sensitive     Inducible Clindamycin NEGATIVE Sensitive     * STAPHYLOCOCCUS SPECIES (COAGULASE NEGATIVE)  Culture, blood (routine x 2)     Status: None (Preliminary result)   Collection Time: 01/24/15  9:32 PM  Result Value Ref Range Status   Specimen Description BLOOD LEFT HAND DRAWN BY RN  Final   Special Requests BOTTLES DRAWN AEROBIC ONLY 4CC  Final   Culture  Setup Time   Final    GRAM POSITIVE COCCI IN CLUSTERS RECOVERED  FROM THE AEROBIC BOTTLE Gram Stain Report Called to,Read Back By and Verified With: NEILSON,T. AT 0615 ON 01/26/2015 BY RESSEGGER,R. Performed at [facility]    Culture   Final    STAPHYLOCOCCUS SPECIES (COAGULASE NEGATIVE) SUSCEPTIBILITIES TO FOLLOW Performed at [facility]    Report Status PENDING  Incomplete  MRSA PCR Screening     Status: None   Collection Time: 01/25/15 12:05 AM  Result Value Ref Range Status   MRSA by PCR NEGATIVE NEGATIVE Final    Comment:        The GeneXpert MRSA Assay (FDA approved for NASAL specimens only), is one component of a comprehensive MRSA colonization surveillance program. It is not intended to diagnose MRSA infection nor to guide or monitor treatment for MRSA infections.   Urine culture     Status: None   Collection Time: 01/25/15  9:49 AM  Result Value Ref Range  Status   Specimen Description URINE, CATHETERIZED  Final   Special Requests NONE  Final   Culture   Final    NO GROWTH 1 DAY [facility]    Report Status 01/26/2015 FINAL  Final     Studies: Dg Chest 2 View  01/27/2015  CLINICAL DATA:  Pneumonia with cough. EXAM: CHEST  2 VIEW COMPARISON:  January 24, 2015. FINDINGS: The heart size and mediastinal contours are within normal limits. No pneumothorax is noted. Mild bilateral pleural effusions are noted. Mild bibasilar opacities are noted most consistent with subsegmental atelectasis or possibly pneumonia. The visualized skeletal structures are unremarkable. IMPRESSION: Mild bibasilar opacities are noted most consistent with subsegmental atelectasis or possibly pneumonia. Mild bilateral pleural effusions are noted as well. Electronically Signed   By: Lupita RaiderJames  Green Jr, M.D.   On: 01/27/2015 11:35    Scheduled Meds: . albumin human  50 g Intravenous Once  . clindamycin  450 mg Oral 4 times per day  . diclofenac sodium  2 g Topical TID  . feeding supplement (ENSURE ENLIVE)  237 mL Oral QODAY  .  furosemide  20 mg Oral BID  . Influenza vac split quadrivalent PF  0.5 mL Intramuscular Tomorrow-1000  . lactulose  30 g Oral TID  . pantoprazole  80 mg Oral Daily  . piperacillin-tazobactam (ZOSYN)  IV  3.375 g Intravenous Q8H  . potassium chloride  20 mEq Oral Daily  . rifaximin  550 mg Oral BID  . spironolactone  25 mg Oral Daily  . vancomycin  750 mg Intravenous Q12H   Continuous Infusions: . 0.9 % NaCl with KCl 20 mEq / L 1 mL (01/27/15 1537)    Assessment and plan:  Principal Problem:   Community acquired pneumonia Active Problems:   Alcoholic cirrhosis (HCC)   Hyponatremia   Bilirubinemia   Thrombocytopenia (HCC)   Hypokalemia   Sepsis (HCC)   Acute renal injury (HCC)   Hyperglycemia   Peripheral edema   Anemia of chronic disease   Gram-positive bacteremia   Diastolic dysfunction   Pulmonary hypertension (HCC)  1. Bibasilar pneumonia.  Patient's chest x-ray on admission revealed patchy bibasilar opacities suspicious for pneumonia. Her white blood cell count was 13.8 on admission. She was febrile with a temperature of 101.8. Blood cultures were ordered and she was started on vancomycin and Zosyn. -Her white blood cell count increased to 23.4; this may have been due to demargination after antibiotics were started. -Strep pneumo/legionella antigen and HIV antibody were negative.   -Blood cultures 2 positive for coag neg staph. Repeat cultures have been ordered  -Continue current management as she appears to be improving slowly. -she will need 2 weeks of vancomycin. Will discontinue zosyn. Sepsis, secondary to pneumonia; coag negative staph bacteremia  The patient's blood pressure was in the 80s systolically on admission, but I doubt septic shock due to her volume depletion and quick improvement with IV fluids. Her lactic acid was 3.1 on admission. Her procalcitonin level was 3.15. - She was started on vigorous IV fluids with improvement in her blood pressure. -Blood  cultures 2 now positive for coag negative staph. 2D echo did not show signs of vegetations. Discussed with Dr. Drue SecondSnider on call for Infectious disease, and recommendations are for PICC line placement and 2 weeks of vancomycin. Repeat blood cultures will be ordered.  -We'll continue IV fluids, but will decrease the rate again as her blood pressures better and she still has some peripheral edema.  Hyperglycemia. Patient has no history of diabetes mellitus, but her glucose was 153 on admission. This may have been from the stress of infections. -Sliding-scale NovoLog was ordered on admission; but was discontinued when her hemoglobin A1c was 5.0. - Will restart NovoLog it if her blood sugar is consistently above 150. Alcoholic cirrhosis with associated coagulopathy, hyperbilirubinemia, mildly elevated ammonia, thrombocytopenia, etc. Patient reported that she stopped drinking in May of this year. She is treated chronically with Lasix, lactulose, Zaroxolyn, and spironolactone. Apparently, she was also taking rifaximin. She is followed by gastroenterologist, Dr. Karilyn Cota. -Her ammonia level is slightly elevated, but she does not appear to be encephalopathic. Abdominal ultrasound revealed no ascites. -Her diuretics were held due to her volume depletion and soft blood pressures, but the patient complained of peripheral edema so IV fluids were decreased and a smaller dose of spironolactone and Lasix were restarted on 10/13. Can titrate up in the next day or 2 if her blood pressure remained stable. -Dr. Karilyn Cota  consulted and he restarted rifaximin. -Her platelet count is in the 50,000 range. Her INR is greater than 3. -Her total bilirubin is trending downward. Her LFTs are minimally elevated. Peripheral edema, secondary to cirrhosis and mild diastolic CHF exacerbation. The patient does have evidence of mild peripheral edema, likely from severe cirrhosis and hypoalbuminemia. However, patient's 2-D echocardiogram  revealed EF of 60-65%, diastolic dysfunction and pulmonary hypertension which are likely contributing. -IV fluids decreased. Lasix and spironolactone restarted at lower doses, will consider increasing the doses back to home doses gradually. Acute kidney injury. Patient has no known history of chronic kidney disease per chart review. Her creatinine on 01/17/15 was within normal limits. -Her creatinine was 1.13 on admission; decreased/increased some, but mostly stable. -We'll adjust IV fluids with mild titration down. Hyponatremia. The patient has chronic hyponatremia from her cirrhosis. Her serum sodium was 128 on admission. Her baseline appears to be around 129-131. -Following IV fluids, it has fell to 126, but improved to 132 following decrease in IV fluid rate. Hypokalemia Patient serum potassium was 3.3 on admission. This was likely secondary to diuretic therapy. -Potassium chloride was added to the IV fluids and she was started on oral potassium. We'll continue potassium supplementation. Potassium better. Normocytic anemia. -Patient probably has anemia of chronic disease superimposed on a history of GI bleeding in April 2016. -Her hemoglobin appears to be slightly below baseline, likely attributed to hemodilution from IV fluids. -We'll continue to monitor. Left shoulder pain. Patient has described this pain as present for several months, but today says that it has gotten worse over the last few days, to the point that she is having difficulty raising her arm. Xray showed some degenerative changes. Will check MRI of left shoulder.   Time spent: 40 minutes.     [facility]  [facility] Pager [phone] . If 7PM-7AM, please contact night-coverage at [email or web address], password [facility] 01/28/2015, 11:02 AM  LOS: 4 days

## 2015-01-28 NOTE — Progress Notes (Signed)
Dr, Kerry HoughMemon returned call, stated patient can go outside with an attendee.

## 2015-01-28 NOTE — Progress Notes (Addendum)
  Subjective:  Patient is still complaining about the problems she had with IV. She continues to complain of right shoulder pain which started about 10 days ago. She is scheduled to undergo MRI to an infection. She has history of left shoulder arthritis. She states she has good appetite. She denies abdominal pain melena or rectal bleeding.  Objective: Blood pressure 104/55, pulse 85, temperature 98.5 F (36.9 C), temperature source Oral, resp. rate 18, height 5\' 8"  (1.727 m), weight 259 lb 6.4 oz (117.663 kg), SpO2 94 %. Patient is alert and in no acute distress. She does not have asterixis. Conjunctiva is pink. Sclera is icteric Abdomen abdomen is protuberant but soft and nontender doubt organomegaly or masses. Pitting edema noted to both flanks. She has at least 3+ pitting edema involving both legs.  Urine output 2.8 L yesterday  Labs/studies Results:   Recent Labs  01/26/15 0431 01/27/15 0441 01/28/15 0551  WBC 14.4* 10.4 7.9  HGB 10.5* 10.3* 11.2*  HCT 30.8* 30.5* 33.1*  PLT 50* 48* 50*    BMET   Recent Labs  01/26/15 0431 01/27/15 0441 01/28/15 0551  NA 130* 132* 133*  K 3.5 3.6 3.4*  CL 98* 100* 100*  CO2 27 27 28   GLUCOSE 137* 142* 139*  BUN 28* 26* 21*  CREATININE 1.06* 1.15* 0.93  CALCIUM 7.9* 7.6* 7.8*    LFT   Recent Labs  01/26/15 0431 01/27/15 0441 01/28/15 0551  PROT 4.7* 4.7* 5.0*  ALBUMIN 1.5* 1.5* 1.6*  AST 47* 48* 51*  ALT 31 30 34  ALKPHOS 140* 185* 202*  BILITOT 6.5* 5.5* 5.8*    PT/INR   Recent Labs  01/27/15 0441 01/28/15 0551  LABPROT 31.9* 29.7*  INR 3.16* 2.89*     Assessment: #1. Fluid overload merrily involving lower extremities and she also has tissue edema but no ascites. She was given 50 g of IV albumin today. She has no IV access today. She seems to be diuresing better than she had before. She has long way to go. #2. Cholestatic alcoholic liver disease. She has not had any alcohol in 6 months but hepatic function  has not improved. She was also treated with prednisone for over 4 weeks. In spite of severe liver disease she is tolerating this acute illness quite well. INR has improved slightly. Will give her vitamin K 10 mg and see if it improves any further. #3. Anemia thrombocytopenia and coagulopathy secondary to chronic liver disease. #4. Hepatic encephalopathy. Clinically she is not an cephalopathic. #5. Pneumonia. She is responding to antibiotic therapy and presently does not have respiratory symptoms #6. Right shoulder pain. MR pending.  Recommendations:  Will give her another 50 g of IV albumin today and tomorrow if there is IV access. Vitamin K 10 mg subcutaneous 1

## 2015-01-29 LAB — COMPREHENSIVE METABOLIC PANEL
ALK PHOS: 162 U/L — AB (ref 38–126)
ALT: 33 U/L (ref 14–54)
AST: 48 U/L — ABNORMAL HIGH (ref 15–41)
Albumin: 1.5 g/dL — ABNORMAL LOW (ref 3.5–5.0)
Anion gap: 7 (ref 5–15)
BILIRUBIN TOTAL: 7.2 mg/dL — AB (ref 0.3–1.2)
BUN: 19 mg/dL (ref 6–20)
CALCIUM: 7.9 mg/dL — AB (ref 8.9–10.3)
CO2: 28 mmol/L (ref 22–32)
CREATININE: 0.78 mg/dL (ref 0.44–1.00)
Chloride: 99 mmol/L — ABNORMAL LOW (ref 101–111)
GFR calc non Af Amer: >60 mL/min (ref >60)
Glucose, Bld: 108 mg/dL — ABNORMAL HIGH (ref 65–99)
Potassium: 3.6 mmol/L (ref 3.5–5.1)
SODIUM: 134 mmol/L — AB (ref 135–145)
TOTAL PROTEIN: 5.1 g/dL — AB (ref 6.5–8.1)

## 2015-01-29 LAB — CULTURE, BLOOD (ROUTINE X 2)

## 2015-01-29 LAB — CBC
HEMATOCRIT: 33 % — AB (ref 36.0–46.0)
HEMOGLOBIN: 11 g/dL — AB (ref 12.0–15.0)
MCH: 32.8 pg (ref 26.0–34.0)
MCHC: 33.3 g/dL (ref 30.0–36.0)
MCV: 98.5 fL (ref 78.0–100.0)
Platelets: 55 10*3/uL — ABNORMAL LOW (ref 150–400)
RBC: 3.35 MIL/uL — ABNORMAL LOW (ref 3.87–5.11)
RDW: 16.8 % — ABNORMAL HIGH (ref 11.5–15.5)
WBC: 7.3 10*3/uL (ref 4.0–10.5)

## 2015-01-29 LAB — PROTIME-INR
INR: 3 — ABNORMAL HIGH (ref 0.00–1.49)
PROTHROMBIN TIME: 30.6 s — AB (ref 11.6–15.2)

## 2015-01-29 MED ORDER — FUROSEMIDE 40 MG PO TABS: 40.0000 mg | ORAL_TABLET | Freq: Every day | ORAL | Status: DC

## 2015-01-29 MED ORDER — SODIUM CHLORIDE 0.9 % IJ SOLN
10.0000 mL | Freq: Two times a day (BID) | INTRAMUSCULAR | Status: DC
  Administered 2015-01-29 – 2015-01-30 (×3): 10 mL

## 2015-01-29 MED ORDER — FUROSEMIDE 40 MG PO TABS
40.0000 mg | ORAL_TABLET | Freq: Two times a day (BID) | ORAL | Status: DC
  Administered 2015-01-29 – 2015-01-30 (×2): 40 mg via ORAL
  Filled 2015-01-29 (×2): qty 1

## 2015-01-29 MED ORDER — ALBUMIN HUMAN 25 % IV SOLN
INTRAVENOUS | Status: AC
  Filled 2015-01-29: qty 50

## 2015-01-29 MED ORDER — ALBUMIN HUMAN 25 % IV SOLN
INTRAVENOUS | Status: AC
  Filled 2015-01-29: qty 200

## 2015-01-29 MED ORDER — SODIUM CHLORIDE 0.9 % IJ SOLN: 10.0000 mL | INTRAMUSCULAR | Status: DC | PRN

## 2015-01-29 MED ORDER — SPIRONOLACTONE 25 MG PO TABS
100.0000 mg | ORAL_TABLET | Freq: Every day | ORAL | Status: DC
  Administered 2015-01-30: 100 mg via ORAL
  Filled 2015-01-29: qty 4

## 2015-01-29 NOTE — Progress Notes (Signed)
Peripherally Inserted Central Catheter/Midline Placement  The IV Nurse has discussed with the patient and/or persons authorized to consent for the patient, the purpose of this procedure and the potential benefits and risks involved with this procedure.  The benefits include less needle sticks, lab draws from the catheter and patient may be discharged home with the catheter.  Risks include, but not limited to, infection, bleeding, blood clot (thrombus formation), and puncture of an artery; nerve damage and irregular heat beat.  Alternatives to this procedure were also discussed.  PICC/Midline Placement Documentation  PICC / Midline Single Lumen 01/29/15 PICC Right Basilic 41 cm 1 cm (Active)  Indication for Insertion or Continuance of Line Home intravenous therapies (PICC only) 01/29/2015  2:45 PM  Exposed Catheter (cm) 1 cm 01/29/2015  2:45 PM  Site Assessment Clean;Dry;Intact 01/29/2015  2:45 PM  Line Status Flushed;Saline locked;Blood return noted 01/29/2015  2:45 PM  Dressing Type Transparent;Securing device 01/29/2015  2:45 PM  Dressing Status Clean;Dry;Intact;Antimicrobial disc in place 01/29/2015  2:45 PM  Line Care Connections checked and tightened 01/29/2015  2:45 PM  Dressing Intervention New dressing 01/29/2015  2:45 PM  Dressing Change Due 02/05/15 01/29/2015  2:45 PM       Daleen SquibbGibson, Deontae Robson Lynn 01/29/2015, 3:10 PM

## 2015-01-29 NOTE — Progress Notes (Addendum)
  Subjective:  Patient continues to complain of left shoulder pain. Yesterday she gave me the impression there was a right shoulder. She states she this pain for over a week but has gotten worse in the last 2 days. She denies fall or injury. She states she has good appetite. Her bowels are moving. She denies melena or rectal bleeding. She feels lower extremity edema is unchanged. She has gained 10 pounds since her admission.    Objective: Blood pressure 98/45, pulse 73, temperature 97.9 F (36.6 C), temperature source Oral, resp. rate 18, height 5\' 8"  (1.727 m), weight 257 lb 8 oz (116.801 kg), SpO2 98 %. Patient is alert and in no acute distress. Asterixis absent. Sclera is icteric. Left shoulder is swollen compared the right and is tender. No bruises or skin abnormality noted. Abdomen is protuberant but very soft. She has edema to her flanks. Lower extremity edema is about 3+ and not much changed.  Urine output 1975 mL in last 24 hours  Labs/studies Results:   Recent Labs  01/27/15 0441 01/28/15 0551 01/29/15 0552  WBC 10.4 7.9 7.3  HGB 10.3* 11.2* 11.0*  HCT 30.5* 33.1* 33.0*  PLT 48* 50* 55*    BMET   Recent Labs  01/27/15 0441 01/28/15 0551 01/29/15 0552  NA 132* 133* 134*  K 3.6 3.4* 3.6  CL 100* 100* 99*  CO2 27 28 28   GLUCOSE 142* 139* 108*  BUN 26* 21* 19  CREATININE 1.15* 0.93 0.78  CALCIUM 7.6* 7.8* 7.9*    LFT   Recent Labs  01/27/15 0441 01/28/15 0551 01/29/15 0552  PROT 4.7* 5.0* 5.1*  ALBUMIN 1.5* 1.6* 1.5*  AST 48* 51* 48*  ALT 30 34 33  ALKPHOS 185* 202* 162*  BILITOT 5.5* 5.8* 7.2*    PT/INR   Recent Labs  01/28/15 0551 01/29/15 0552  LABPROT 29.7* 30.6*  INR 2.89* 3.00*     Assessment:  #1. Fluid overload. She has lost 2 pounds in the last 24 hours but she still is 10 pounds more than she weighed on admission. Renal function is well-preserved and diuretic therapy could be escalated. Fluid overload is primarily in the form of  third spacing and not ascites. #2. Pneumonia and sepsis secondary to coag-negative staph. Patient has responded to antibiotics. According to Dr. Kerry HoughMemon she will need 2 more weeks of IV vancomycin and will need PICC line. #3. Anemia. Her anemia appears to be due to acute and chronic disease. No evidence of overt GI bleed. #4. Decompensated alcoholic cirrhosis. She remains with cholestasis and significant coagulopathy and thrombocytopenia. Coagulopathy has not corrected with vitamin K. #5. Left shoulder pain. Plain films nonrevealing. No history of fall. MR and ortho consult pending. Patient high risk for surgical intervention.   Recommendations:  Change furosemide scheduled to 40 mg by mouth every morning. Increase spironolactone to 100 mg by mouth daily. Will give her IV albumin once PICC line has been placed. Will recheck INR in a.m.

## 2015-01-29 NOTE — Progress Notes (Signed)
[facility] PROGRESS NOTE  Marissa Figueroa ZOX:[id number] DOB: 1955/06/07 DOA: 01/24/2015 PCP: Ignatius Specking., MD    Code Status: Full code Family Communication: Discussed with patient; family not available Disposition Plan: Discharge to home when clinically appropriate; possibly in 24-48 hours.   Consultants:  Gastroenterology : Dr. Karilyn Cota  Procedures:  Echo 01/26/15:Study Conclusions - Left ventricle: The cavity size was normal. Wall thickness was increased in a pattern of mild LVH. Systolic function was normal. The estimated ejection fraction was in the range of 60% to 65%. Wall motion was normal; there were no regional wall motion abnormalities. Doppler parameters are consistent with high ventricular filling pressure. - Aortic valve: Mildly calcified annulus. Trileaflet. - Mitral valve: Calcified annulus. There was trivial regurgitation. - Left atrium: The atrium was at the upper limits of normal in size. - Tricuspid valve: There was trivial regurgitation. - Pulmonary arteries: Systolic pressure was moderately increased. PA peak pressure: 52 mm Hg (S). - Pericardium, extracardiac: There was no pericardial effusion. Impressions: - Mild LVH with LVEF 60-65% and increased filling pressures. Upper normal left atrial chamber size. Trivial tricuspid regurgitation. Evidence of moderate pulmonary hypertension with PASP 52 mmHg.  Antibiotics:   Vancomycin 10/12>>  Zosyn 10/12>>10/15  HPI/Subjective: Denies any shortness of breath. Continues to complain of left shoulder pain and reports movement is limited due to pain.  Objective: Filed Vitals:   01/29/15 0355  BP: 98/45  Pulse: 73  Temp: 97.9 F (36.6 C)  Resp: 18    Intake/Output Summary (Last 24 hours) at 01/29/15 1805 Last data filed at 01/29/15 1741  Gross per 24 hour  Intake    130 ml  Output   2375 ml  Net  -2245 ml   Filed Weights   01/27/15 0500 01/28/15 0429 01/29/15  0345  Weight: 118.7 kg (261 lb 11 oz) 117.663 kg (259 lb 6.4 oz) 116.801 kg (257 lb 8 oz)    Exam:   General:  Obese ill-appearing 58 year old woman laying in bed, in no acute distress.  HEENT: Bilateral scleral icterus and conjunctival injection.  Cardiovascular: S1, S2, RRR.  Respiratory: clear bilaterally  Abdomen: obese, positive bowel sounds, nontender, nondistended  Musculoskeletal: left shoulder appears to have joint effusion. 2+ pitting edema of the lower extremities extending up to her thighs bilaterally.  Neurologic: She is alert and oriented to herself, hospital, month, and year.   Data Reviewed: Basic Metabolic Panel:  Recent Labs Lab 01/25/15 0453 01/26/15 0431 01/27/15 0441 01/28/15 0551 01/29/15 0552  NA 126* 130* 132* 133* 134*  K 3.3* 3.5 3.6 3.4* 3.6  CL 94* 98* 100* 100* 99*  CO2 GLUCOSE 218* 137* 142* 139* 108*  BUN 26* 28* 26* 21* 19  CREATININE 1.25* 1.06* 1.15* 0.93 0.78  CALCIUM 8.1* 7.9* 7.6* 7.8* 7.9*   Liver Function Tests:  Recent Labs Lab 01/24/15 2118 01/26/15 0431 01/27/15 0441 01/28/15 0551 01/29/15 0552  AST 58* 47* 48* 51* 48*  ALT 37 31 30 34 33  ALKPHOS 168* 140* 185* 202* 162*  BILITOT 10.7* 6.5* 5.5* 5.8* 7.2*  PROT 6.0* 4.7* 4.7* 5.0* 5.1*  ALBUMIN 1.9* 1.5* 1.5* 1.6* 1.5*    Recent Labs Lab 01/24/15 2118  LIPASE 54*    Recent Labs Lab 01/24/15 2118 01/26/15 0431  AMMONIA 50* 51*   CBC:  Recent Labs Lab 01/24/15 2118 01/25/15 0453 01/26/15 0431 01/27/15 0441 01/28/15 0551 01/29/15 0552  WBC 13.8* 23.4* 14.4* 10.4 7.9 7[name]4*  --   --   --   --   --  HGB 12.3 10.1* 10.5* 10.3* 11.2* 11.0*  HCT 35.8* 29.4* 30.8* 30.5* 33.1* 33.0*  MCV 97.5 98.7 98.1 98.4 98.2 98.5  PLT 57* 45* 50* 48* 50* 55*   Cardiac Enzymes: No results for input(s): CKTOTAL, CKMB, CKMBINDEX, TROPONINI in the last 168 hours. BNP (last 3 results)  Recent Labs  08/09/14 1459  BNP 178.0*     ProBNP (last 3 results) No results for input(s): PROBNP in the last 8760 hours.  CBG:  Recent Labs Lab 01/26/15 0712 01/26/15 1133 01/26/15 1543 01/26/15 2133 01/27/15 0744  GLUCAP 146* 167* 130* 140* 152*    Recent Results (from the past 240 hour(s))  Culture, blood (routine x 2)     Status: None   Collection Time: 01/24/15  9:18 PM  Result Value Ref Range Status   Specimen Description BLOOD RIGHT ANTECUBITAL  Final   Special Requests BOTTLES DRAWN AEROBIC AND ANAEROBIC 10CC EACH  Final   Culture  Setup Time   Final    GRAM POSITIVE COCCI IN PAIRS RECOVERED FROM BOTH THE ANAEROBIC AND AEROBIC BOTTLES. Gram Stain Report Called to,Read Back By and Verified With: GRAY,M. AT 1245 ON 01/25/2015 BY BAUGHAM,M. Performed at [facility]    Culture   Final    STAPHYLOCOCCUS SPECIES (COAGULASE NEGATIVE) Performed at [facility]    Report Status 01/29/2015 FINAL  Final   Organism ID, Bacteria STAPHYLOCOCCUS SPECIES (COAGULASE NEGATIVE)  Final      Susceptibility   Staphylococcus species (coagulase negative) - MIC*    CIPROFLOXACIN >=8 RESISTANT Resistant     ERYTHROMYCIN >=8 RESISTANT Resistant     GENTAMICIN <=0.5 SENSITIVE Sensitive     OXACILLIN >=4 RESISTANT Resistant     TETRACYCLINE >=16 RESISTANT Resistant     VANCOMYCIN 2 SENSITIVE Sensitive     TRIMETH/SULFA 80 RESISTANT Resistant     CLINDAMYCIN <=0.25 SENSITIVE Sensitive     RIFAMPIN <=0.5 SENSITIVE Sensitive     Inducible Clindamycin NEGATIVE Sensitive     * STAPHYLOCOCCUS SPECIES (COAGULASE NEGATIVE)  Culture, blood (routine x 2)     Status: None   Collection Time: 01/24/15  9:32 PM  Result Value Ref Range Status   Specimen Description BLOOD LEFT HAND DRAWN BY RN  Final   Special Requests BOTTLES DRAWN AEROBIC ONLY 4CC  Final   Culture  Setup Time   Final    GRAM POSITIVE COCCI IN CLUSTERS RECOVERED FROM THE AEROBIC BOTTLE Gram Stain Report Called to,Read Back By and Verified With:  NEILSON,T. AT 0615 ON 01/26/2015 BY RESSEGGER,R. Performed at [facility]    Culture   Final    STAPHYLOCOCCUS SPECIES (COAGULASE NEGATIVE) SUSCEPTIBILITIES PERFORMED ON PREVIOUS CULTURE WITHIN THE LAST 5 DAYS. Performed at [facility]    Report Status 01/29/2015 FINAL  Final  MRSA PCR Screening     Status: None   Collection Time: 01/25/15 12:05 AM  Result Value Ref Range Status   MRSA by PCR NEGATIVE NEGATIVE Final    Comment:        The GeneXpert MRSA Assay (FDA approved for NASAL specimens only), is one component of a comprehensive MRSA colonization surveillance program. It is not intended to diagnose MRSA infection nor to guide or monitor treatment for MRSA infections.   Urine culture     Status: None   Collection Time: 01/25/15  9:49 AM  Result Value Ref Range Status   Specimen Description URINE, CATHETERIZED  Final   Special Requests NONE  Final  Culture   Final    NO GROWTH 1 DAY Performed at [facility]    Report Status 01/26/2015 FINAL  Final     Studies: No results found.  Scheduled Meds: . albumin human  50 g Intravenous Once  . clindamycin  450 mg Oral 4 times per day  . diclofenac sodium  2 g Topical TID  . feeding supplement (ENSURE ENLIVE)  237 mL Oral QODAY  . furosemide  40 mg Oral BID  . Influenza vac split quadrivalent PF  0.5 mL Intramuscular Tomorrow-1000  . lactulose  30 g Oral TID  . pantoprazole  80 mg Oral Daily  . potassium chloride  20 mEq Oral Daily  . rifaximin  550 mg Oral BID  . sodium chloride  10-40 mL Intracatheter Q12H  . [START ON 01/30/2015] spironolactone  100 mg Oral Daily  . vancomycin  750 mg Intravenous Q12H   Continuous Infusions:    Assessment and plan:  Principal Problem:   Community acquired pneumonia Active Problems:   Alcoholic cirrhosis (HCC)   Hyponatremia   Bilirubinemia   Thrombocytopenia (HCC)   Hypokalemia   Sepsis (HCC)   Acute renal injury (HCC)   Hyperglycemia    Peripheral edema   Anemia of chronic disease   Gram-positive bacteremia   Diastolic dysfunction   Pulmonary hypertension (HCC)  1. Bibasilar pneumonia.  Patient's chest x-ray on admission revealed patchy bibasilar opacities suspicious for pneumonia. Her white blood cell count was 13.8 on admission. She was febrile with a temperature of 101.8. Blood cultures were ordered and she was started on vancomycin and Zosyn. -Her white blood cell count increased to 23.4; this may have been due to demargination after antibiotics were started. -Strep pneumo/legionella antigen and HIV antibody were negative.   -Blood cultures 2 positive for coag neg staph. Repeat cultures are in process -Continue current management as she appears to be improving slowly. -she will need 2 weeks of vancomycin.  Sepsis, secondary to pneumonia; coag negative staph bacteremia  The patient's blood pressure was in the 80s systolically on admission, but I doubt septic shock due to her volume depletion and quick improvement with IV fluids. Her lactic acid was 3.1 on admission. Her procalcitonin level was 3.15. - She was started on vigorous IV fluids with improvement in her blood pressure. -Blood cultures 2 now positive for coag negative staph. 2D echo did not show signs of vegetations. Discussed with Dr. Drue Second on call for Infectious disease, and recommendations are for PICC line placement and 2 weeks of vancomycin. Repeat blood cultures have been ordered to document clearing of bacteremia.  Hyperglycemia. Patient has no history of diabetes mellitus, but her glucose was 153 on admission. This may have been from the stress of infections. -Sliding-scale NovoLog was ordered on admission; but was discontinued when her hemoglobin A1c was 5.0. - Will restart NovoLog it if her blood sugar is consistently above 150. Alcoholic cirrhosis with associated coagulopathy, hyperbilirubinemia, mildly elevated ammonia, thrombocytopenia, etc. Patient  reported that she stopped drinking in May of this year. She is treated chronically with Lasix, lactulose, Zaroxolyn, and spironolactone. Apparently, she was also taking rifaximin. She is followed by gastroenterologist, Dr. Karilyn Cota. -Her ammonia level is slightly elevated, but she does not appear to be encephalopathic. Abdominal ultrasound revealed no ascites. -Her diuretics were held due to her volume depletion and soft blood pressures, but the patient complained of peripheral edema so IV fluids were decreased and a smaller dose of spironolactone and Lasix were  restarted on 10/13. Since her renal function has remained stable, her diuretics dose has been titrated up today. She will also receive albumin that will hopefully help with diuresis. -Dr. Karilyn Cota  consulted and he restarted rifaximin. -Her platelet count is in the 50,000 range. Her INR is greater than 3. -Her total bilirubin is trending downward. Her LFTs are minimally elevated. Peripheral edema, secondary to cirrhosis and mild diastolic CHF exacerbation. The patient does have evidence of mild peripheral edema, likely from severe cirrhosis and hypoalbuminemia. However, patient's 2-D echocardiogram revealed EF of 60-65%, diastolic dysfunction and pulmonary hypertension which are likely contributing. -IV fluids discontinued today and lasix/spironolactone dosing increased. Continue to monitor urine output. Acute kidney injury. Patient has no known history of chronic kidney disease per chart review. Her creatinine on 01/17/15 was within normal limits. -Her creatinine was 1.13 on admission; decreased/increased some, but mostly stable. Hyponatremia. The patient has chronic hyponatremia from her cirrhosis. Her serum sodium was 128 on admission. Her baseline appears to be around 129-131. -Following IV fluids, it has fell to 126, but improved to 132 following decrease in IV fluid rate. Hypokalemia Patient serum potassium was 3.3 on admission. This was  likely secondary to diuretic therapy. -Potassium chloride was added to the IV fluids and she was started on oral potassium. We'll continue potassium supplementation. Potassium is better. Normocytic anemia. -Patient probably has anemia of chronic disease superimposed on a history of GI bleeding in April 2016. -Her hemoglobin appears to be slightly below baseline, likely attributed to hemodilution from IV fluids. -We'll continue to monitor. Left shoulder pain. Patient has described this pain as present for several months, but today says that it has gotten worse over the last few days, to the point that she is having difficulty raising her arm. Xray showed some degenerative changes. Will check MRI of left shoulder. If she fails to improve, can consider orthopedics consult   Time spent: 25 minutes.     [facility]  [facility] Pager [phone] . If 7PM-7AM, please contact night-coverage at [email or web address], password [facility] 01/29/2015, 6:05 PM  LOS: 5 days

## 2015-01-29 NOTE — Progress Notes (Signed)
Foley catheter removed per orders.

## 2015-01-29 NOTE — Progress Notes (Signed)
ANTIBIOTIC CONSULT NOTE- follow up  Pharmacy Consult for Vancomycin and Zosyn Indication: sepsis / Gram (+) Bacteremia  Allergies  Allergen Reactions  . Ultram [Tramadol] Other (See Comments)    Headache   Patient Measurements: Height: 5\' 8"  (172.7 cm) Weight: 257 lb 8 oz (116.801 kg) IBW/kg (Calculated) : 63.9  Vital Signs: Temp: 97.9 F (36.6 C) (10/16 0355) Temp Source: Oral (10/16 0355) BP: 98/45 mmHg (10/16 0355) Pulse Rate: 73 (10/16 0355)  Labs:  Recent Labs  01/27/15 0441 01/28/15 0551 01/29/15 0552  WBC 10.4 7.9 7.3  HGB 10.3* 11.2* 11.0*  PLT 48* 50* 55*  CREATININE 1.15* 0.93 0.78   Estimated Creatinine Clearance: 103 mL/min (by C-G formula based on Cr of 0.78).  No results for input(s): VANCOTROUGH, VANCOPEAK, VANCORANDOM, GENTTROUGH, GENTPEAK, GENTRANDOM, TOBRATROUGH, TOBRAPEAK, TOBRARND, AMIKACINPEAK, AMIKACINTROU, AMIKACIN in the last 72 hours.   Microbiology: Recent Results (from the past 720 hour(s))  Culture, blood (routine x 2)     Status: None   Collection Time: 01/24/15  9:18 PM  Result Value Ref Range Status   Specimen Description BLOOD RIGHT ANTECUBITAL  Final   Special Requests BOTTLES DRAWN AEROBIC AND ANAEROBIC 10CC EACH  Final   Culture  Setup Time   Final    GRAM POSITIVE COCCI IN PAIRS RECOVERED FROM BOTH THE ANAEROBIC AND AEROBIC BOTTLES. Gram Stain Report Called to,Read Back By and Verified With: GRAY,M. AT 1245 ON 01/25/2015 BY BAUGHAM,M. Performed at [facility]    Culture   Final    STAPHYLOCOCCUS SPECIES (COAGULASE NEGATIVE) SUSCEPTIBILITIES PERFORMED ON PREVIOUS CULTURE WITHIN THE LAST 5 DAYS. [facility]formed [facility]    Report Status 01/28/2015 FINAL  Final   Organism ID, Bacteria STAPHYLOCOCCUS SPECIES (COAGULASE NEGATIVE)  Final      Susceptibility   Staphylococcus species (coagulase negative) - MIC*    CIPROFLOXACIN >=8 RESISTANT Resistant     ERYTHROMYCIN >=8 RESISTANT Resistant     GENTAMICIN <=0.5  SENSITIVE Sensitive     OXACILLIN >=4 RESISTANT Resistant     TETRACYCLINE >=16 RESISTANT Resistant     VANCOMYCIN 2 SENSITIVE Sensitive     TRIMETH/SULFA 80 RESISTANT Resistant     CLINDAMYCIN <=0.25 SENSITIVE Sensitive     RIFAMPIN <=0.5 SENSITIVE Sensitive     Inducible Clindamycin NEGATIVE Sensitive     * STAPHYLOCOCCUS SPECIES (COAGULASE NEGATIVE)  Culture, blood (routine x 2)     Status: None (Preliminary result)   Collection Time: 01/24/15  9:32 PM  Result Value Ref Range Status   Specimen Description BLOOD LEFT HAND DRAWN BY RN  Final   Special Requests BOTTLES DRAWN AEROBIC ONLY 4CC  Final   Culture  Setup Time   Final    GRAM POSITIVE COCCI IN CLUSTERS RECOVERED FROM THE AEROBIC BOTTLE Gram Stain Report Called to,Read Back By and Verified With: NEILSON,T. AT 0615 ON 01/26/2015 BY RESSEGGER,R. Performed at [facility]    Culture   Final    STAPHYLOCOCCUS SPECIES (COAGULASE NEGATIVE) SUSCEPTIBILITIES TO FOLLOW [facility]formed at [facility]    Report Status PENDING  Incomplete  MRSA PCR Screening     Status: None   Collection Time: 01/25/15 12:05 AM  Result Value Ref Range Status   MRSA by PCR NEGATIVE NEGATIVE Final    Comment:        The GeneXpert MRSA Assay (FDA approved for NASAL specimens only), is one component of a comprehensive MRSA colonization surveillance program. It is not intended to diagnose MRSA infection  nor to guide or monitor treatment for MRSA infections.   Urine culture     Status: None   Collection Time: 01/25/15  9:49 AM  Result Value Ref Range Status   Specimen Description URINE, CATHETERIZED  Final   Special Requests NONE  Final   Culture   Final    NO GROWTH 1 DAY Performed at [facility]    Report Status 01/26/2015 FINAL  Final   Medical History: Past Medical History  Diagnosis Date  . Cirrhosis (HCC)     ALCOHOLIC  . Alcoholic cirrhosis of liver (HCC)   . CHF (congestive heart failure) (HCC)   . Shortness  of breath     exertion   Medications:  Scheduled:  . albumin human  50 g Intravenous Once  . clindamycin  450 mg Oral 4 times per day  . diclofenac sodium  2 g Topical TID  . feeding supplement (ENSURE ENLIVE)  237 mL Oral QODAY  . furosemide  20 mg Oral BID  . Influenza vac split quadrivalent PF  0.5 mL Intramuscular Tomorrow-1000  . lactulose  30 g Oral TID  . pantoprazole  80 mg Oral Daily  . potassium chloride  20 mEq Oral Daily  . rifaximin  550 mg Oral BID  . spironolactone  25 mg Oral Daily  . vancomycin  750 mg Intravenous Q12H   Assessment: 59 yo female being treated for sepsis. She has no IV access and is currently taking po clindamycin.  She has MRSE in 2/2 BC which is sensitive to clinda.  Goal of Therapy:  Eradicate infection.   Plan:  F/u restart IV  Monitor labs, renal fxn, and c/s  Quenten Nawaz Poteet, RPH 01/29/2015,8:53 AM

## 2015-01-30 ENCOUNTER — Inpatient Hospital Stay (HOSPITAL_COMMUNITY): Payer: Medicare Other

## 2015-01-30 ENCOUNTER — Encounter (HOSPITAL_COMMUNITY): Payer: Self-pay | Admitting: Internal Medicine

## 2015-01-30 ENCOUNTER — Telehealth (INDEPENDENT_AMBULATORY_CARE_PROVIDER_SITE_OTHER): Payer: Self-pay | Admitting: *Deleted

## 2015-01-30 DIAGNOSIS — A4101 Sepsis due to Methicillin susceptible Staphylococcus aureus: Secondary | ICD-10-CM

## 2015-01-30 DIAGNOSIS — E876 Hypokalemia: Secondary | ICD-10-CM

## 2015-01-30 LAB — BASIC METABOLIC PANEL
Anion gap: 7 (ref 5–15)
BUN: 18 mg/dL (ref 6–20)
CALCIUM: 8.2 mg/dL — AB (ref 8.9–10.3)
CO2: 31 mmol/L (ref 22–32)
CREATININE: 0.78 mg/dL (ref 0.44–1.00)
Chloride: 96 mmol/L — ABNORMAL LOW (ref 101–111)
GFR calc Af Amer: >60 mL/min (ref >60)
GFR calc non Af Amer: >60 mL/min (ref >60)
GLUCOSE: 96 mg/dL (ref 65–99)
Potassium: 3.2 mmol/L — ABNORMAL LOW (ref 3.5–5.1)
Sodium: 134 mmol/L — ABNORMAL LOW (ref 135–145)

## 2015-01-30 LAB — CBC
HCT: 29.8 % — ABNORMAL LOW (ref 36.0–46.0)
HEMOGLOBIN: 10.1 g/dL — AB (ref 12.0–15.0)
MCH: 33.2 pg (ref 26.0–34.0)
MCHC: 33.9 g/dL (ref 30.0–36.0)
MCV: 98 fL (ref 78.0–100.0)
PLATELETS: 51 10*3/uL — AB (ref 150–400)
RBC: 3.04 MIL/uL — AB (ref 3.87–5.11)
RDW: 16.6 % — ABNORMAL HIGH (ref 11.5–15.5)
WBC: 6.9 10*3/uL (ref 4.0–10.5)

## 2015-01-30 MED ORDER — POTASSIUM CHLORIDE CRYS ER 20 MEQ PO TBCR
40.0000 meq | EXTENDED_RELEASE_TABLET | Freq: Every day | ORAL | Status: DC
  Administered 2015-01-30: 40 meq via ORAL
  Filled 2015-01-30: qty 2

## 2015-01-30 MED ORDER — VANCOMYCIN HCL IN DEXTROSE 750-5 MG/150ML-% IV SOLN: 750.0000 mg | Freq: Two times a day (BID) | INTRAVENOUS | Status: AC

## 2015-01-30 MED ORDER — DICLOFENAC SODIUM 1 % TD GEL: 2.0000 g | Freq: Three times a day (TID) | TRANSDERMAL | Status: AC

## 2015-01-30 MED ORDER — POTASSIUM CHLORIDE CRYS ER 20 MEQ PO TBCR: 40.0000 meq | EXTENDED_RELEASE_TABLET | Freq: Every day | ORAL | Status: DC

## 2015-01-30 MED ORDER — POTASSIUM CHLORIDE CRYS ER 20 MEQ PO TBCR
40.0000 meq | EXTENDED_RELEASE_TABLET | Freq: Every day | ORAL | Status: AC
Start: 2015-01-30 — End: ?

## 2015-01-30 MED ORDER — HEPARIN SOD (PORK) LOCK FLUSH 100 UNIT/ML IV SOLN
250.0000 [IU] | INTRAVENOUS | Status: AC | PRN
  Administered 2015-01-30: 250 [IU]
  Filled 2015-01-30: qty 5

## 2015-01-30 MED ORDER — METOLAZONE 5 MG PO TABS: 5.0000 mg | ORAL_TABLET | Freq: Once | ORAL | Status: AC

## 2015-01-30 MED ORDER — RIFAXIMIN 550 MG PO TABS: 550.0000 mg | ORAL_TABLET | Freq: Two times a day (BID) | ORAL | Status: DC

## 2015-01-30 MED ORDER — POTASSIUM CHLORIDE CRYS ER 20 MEQ PO TBCR: 20.0000 meq | EXTENDED_RELEASE_TABLET | Freq: Every day | ORAL | Status: DC

## 2015-01-30 MED ORDER — ALBUMIN HUMAN 25 % IV SOLN
50.0000 g | Freq: Once | INTRAVENOUS | Status: DC
  Filled 2015-01-30: qty 200

## 2015-01-30 NOTE — Care Management Note (Signed)
Case Management Note  Patient Details  Name: Marissa Figueroa MRN: [id number] Date of Birth: May 12, 1955  Expected Discharge Date:                  Expected Discharge Plan:  Home w Home Health Services  In-House Referral:  NA  Discharge planning Services  CM Consult  Post Acute Care Choice:  Resumption of Svcs/PTA Provider Choice offered to:  Patient  DME Arranged:    DME Agency:     HH Arranged:  RN, PT HH Agency:  [facility]  Status of Service:  Completed, signed off  Medicare Important Message Given:  Yes-third notification given Date Medicare IM Given:    Medicare IM give by:    Date Additional Medicare IM Given:    Additional Medicare Important Message give by:     If discussed at Long Length of Stay Meetings, dates discussed:    Additional Comments: Pt discharging home today with resumption of HH services and IV abx. Alroy BailiffLinda Lothian, of [facility], made aware of referral and DC today. Will obtain pt info from chart. Pt's next dose of IV abx will be due on 01/31/2015. No further CM needs noted at this time.  Malcolm Metrohildress, Genee Rann Demske, RN 01/30/2015, 9:44 AM

## 2015-01-30 NOTE — Telephone Encounter (Signed)
Marissa CanesChristine said she is going home from the hospital today and is needing a prescription for Phenergan. After she eats, she is vomiting. Her return phone number is [phone].

## 2015-01-30 NOTE — Discharge Summary (Signed)
Physician Discharge Summary  Marissa Figueroa ZOX:[id number] DOB: 01-20-1956 DOA: 01/24/2015  PCP: Ignatius Specking., MD  Admit date: 01/24/2015 Discharge date: 01/30/2015  Time spent: Greater than 30 minutes  Recommendations for Outpatient Follow-up:  1. Patient discharged on IV vancomycin through the last dose on 02/09/2015. Home health protocol will be followed. 2. Repeat blood cultures ordered on 01/29/15 and were pending at the time of discharge  3. Recommend follow-up assessment of her serum potassium.  Discharge Diagnoses:  1. Basilar pneumonia. 2. Coagulase-negative Staphylococcus bacteremia 3. Sepsis secondary to #1 and #2. 4. Alcoholic cirrhosis with associated coagulopathy, hyperbilirubinemia, thrombocytopenia. 5. Peripheral edema secondary to hypoalbuminemia from alcoholic cirrhosis and mild acute diastolic heart failure. 6. Diastolic dysfunction and pulmonary hypertension per echo; EF 60-65%. 7. Acute kidney injury secondary to prerenal azotemia. 8. Hyponatremia. 9. Hypokalemia. 10. Normocytic anemia. 11. Hyperglycemia; hemoglobin A1c 5.0. 12. Obesity. 13. Left shoulder pain. Patient refused MRI imaging.   Discharge Condition: Improved.  Diet recommendation: Heart healthy.  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 In the ED, she was febrile with a temperature of 101.8, tachycardic, and mildly hypotensive. She was oxygenating in the 90s on supplemental oxygen. Her chest x-ray revealed patchy bibasilar opacities suspicious for pneumonia. She was admitted for further evaluation and management.  Hospital Course:   1. Bibasilar pneumonia.  Patient's chest x-ray on admission revealed patchy bibasilar opacities suspicious for pneumonia. Her white blood cell count was 13.8 on admission. She was febrile with a temperature of 101.8. Blood cultures were ordered and she was started on vancomycin and Zosyn. -Her white blood cell count increased to 23.4; this may have been due to demargination after antibiotics were started. -Strep pneumo/legionella antigen and HIV antibody were negative.  -Blood cultures 2 positive for coag neg staph. Follow-up/repeat blood cultures were ordered on 01/29/15 and were pending at the time of discharge. -She was discharged on vancomycin to be completed through the last dose on 02/09/15 for total of 2 weeks of therapy. Sepsis, secondary to pneumonia; coag negative staph bacteremia  The patient's blood pressure was in the 80s systolically on admission, but I doubted septic shock due to her volume depletion and quick improvement with IV fluids. Her lactic acid was 3.1 on admission. Her procalcitonin level was 3.15. - She was started on vigorous IV fluids with improvement in her blood pressure. -Blood cultures 2 became positive for coag negative staph. 2D echo did not show signs of vegetations. Discussed with Dr. Drue Second on call for Infectious disease, and recommendations were for PICC line placement and 2 weeks of vancomycin. Repeat blood cultures have been ordered to document clearing of bacteremia.  Hyperglycemia. Patient has no history of diabetes mellitus, but her glucose was 153 on admission. This may have been from the stress of infections. -Sliding-scale NovoLog was ordered on admission; but was discontinued when her hemoglobin A1c was 5.0. Alcoholic cirrhosis with associated coagulopathy, hyperbilirubinemia, mildly elevated ammonia, thrombocytopenia, etc. Patient reported that she stopped drinking in May of this year. She is treated chronically with Lasix, lactulose, Zaroxolyn,  and spironolactone. Apparently, she was also taking rifaximin. She is followed  by gastroenterologist, Dr. Karilyn Cota. -Her ammonia level was slightly elevated, but she did not appear to be encephalopathic. Abdominal ultrasound revealed no ascites. -Her diuretics were held due to her volume depletion and soft blood pressures, but the patient complained of peripheral edema so IV fluids were eventually discontinued. Since her renal function improved, her home diuretic therapy was restarted. Dr. Karilyn Cota was consulted and reordered rifaximin main and ordered albumin infusion to help with diuresis of the peripheral edema.. -Her platelet count was in the 50,000 range. Her INR is greater than 3. -Her total bilirubin and liver transaminases trended downward some. Peripheral edema, secondary to cirrhosis and mild diastolic CHF exacerbation. The patient had evidence of mild p-moderate eripheral edema, likely from severe cirrhosis and hypoalbuminemia. However, patient's 2-D echocardiogram revealed EF of 60-65%, diastolic dysfunction and pulmonary hypertension which were likely contributing. -IV fluids were tapered off. She was restarted on her home regimen of diuretics. Acute kidney injury. Patient has no known history of chronic kidney disease per chart review. Her creatinine on 01/17/15 was within normal limits. -Her creatinine was 1.13 on admission and normalized to 0.78 at the time of discharge. Hyponatremia. The patient has chronic hyponatremia from her cirrhosis. Her serum sodium was 128 on admission. Her baseline appears to be around 129-131. -Following IV fluids, it has fell to 126, but improved to 132 following decrease in IV fluid rate. Hypokalemia Patient serum potassium was 3.3 on admission. This was likely secondary to diuretic therapy. -Potassium chloride was added to the IV fluids and she was started on oral potassium. Her serum potassium was still slightly low at the time of discharge after improving.  She was discharged on potassium chloride supplementation. Normocytic anemia. -Patient probably has anemia of chronic disease superimposed on a history of GI bleeding in April 2016. -Her hemoglobin appeared to be slightly below baseline, likely attributed to hemodilution from IV fluids. -Her hemoglobin ranged from 10-11 and was 10.1 at the time of discharge. Left shoulder pain. Patient has described this pain as present for several months and she has been progressively unable to raise her left arm. Xray showed some degenerative changes. She was started on topical Voltaren and when necessary analgesics. MRI of the left shoulder was ordered, but she refused. Recommend outpatient orthopedic consult.    Procedures:  Echo 01/26/15:Study Conclusions - Left ventricle: The cavity size was normal. Wall thickness was increased in a pattern of mild LVH. Systolic function was normal. The estimated ejection fraction was in the range of 60% to 65%. Wall motion was normal; there were no regional wall motion abnormalities. Doppler parameters are consistent with high ventricular filling pressure. - Aortic valve: Mildly calcified annulus. Trileaflet. - Mitral valve: Calcified annulus. There was trivial regurgitation. - Left atrium: The atrium was at the upper limits of normal in size. - Tricuspid valve: There was trivial regurgitation. - Pulmonary arteries: Systolic pressure was moderately increased. PA peak pressure: 52 mm Hg (S). - Pericardium, extracardiac: There was no pericardial effusion. Impressions: - Mild LVH with LVEF 60-65% and increased filling pressures. Upper normal left atrial chamber size. Trivial tricuspid regurgitation. Evidence of moderate pulmonary hypertension with PASP 52 mmHg.  Consultations:  Gastroenterology : Dr. Karilyn Cota  Discharge Exam: Filed Vitals:   01/30/15 0533  BP: 114/52  Pulse: 77  Temp: 98 F (36.7 C)  Resp: 18   oxygen saturation 95% on  room air.   General: Obese 59 year old woman in no acute distress. Overall, she looks better.  HEENT: Bilateral scleral icterus  and conjunctival injection; slightly less icterus and conjunctival injection.  Cardiovascular: S1, S2, with a soft systolic murmur.  Respiratory: Clear to auscultation bilaterally with decreased breath sounds in the bases.  Abdomen: obese, positive bowel sounds, nontender, nondistended  Musculoskeletal: left shoulder appears to have joint effusion. 1- 2+ pitting edema of the lower extremities extending up to her thighs bilaterally.   Discharge Instructions   Discharge Instructions    Diet general    Complete by:  As directed      Discharge instructions    Complete by:  As directed   TRY TO STOP SMOKING. TAKE MEDICATIONS AS PRESCRIBED.     Increase activity slowly    Complete by:  As directed           Current Discharge Medication List    START taking these medications   Details  diclofenac sodium (VOLTAREN) 1 % GEL Apply 2 g topically 3 (three) times daily. Applied to left shoulder Qty: 100 g, Refills: 2    rifaximin (XIFAXAN) 550 MG TABS tablet Take 1 tablet (550 mg total) by mouth 2 (two) times daily at 10 AM and 5 PM. Qty: 60 tablet, Refills: 3    Vancomycin (VANCOCIN) 750 MG/150ML SOLN Inject 150 mLs (750 mg total) into the vein every 12 (twelve) hours. Continue as prescribed and discontinue after last dose on 02/09/15. Qty: 4000 mL, Refills: 1      CONTINUE these medications which have CHANGED   Details  metolazone (ZAROXOLYN) 5 MG tablet Take 1 tablet (5 mg total) by mouth once. Take only as needed every 2-3 days for increased swelling. Qty: 30 tablet, Refills: 1   Associated Diagnoses: Alcoholic cirrhosis of liver without ascites (HCC)    potassium chloride SA (KLOR-CON M20) 20 MEQ tablet Take 2 tablets (40 mEq total) by mouth daily.      CONTINUE these medications which have NOT CHANGED   Details  B Complex-Biotin-FA (B-COMPLEX  PO) Take 1 tablet by mouth daily.    cholecalciferol (VITAMIN D) 1000 UNITS tablet Take 1,000 Units by mouth daily.    ENSURE (ENSURE) Take 1 Can by mouth every other day.     folic acid (FOLVITE) 1 MG tablet Take 1 tablet (1 mg total) by mouth daily.    furosemide (LASIX) 40 MG tablet Take 1 tablet (40 mg total) by mouth daily. Qty: 30 tablet, Refills: 3   Associated Diagnoses: Ascites    lactulose (CHRONULAC) 10 GM/15ML solution Take 45 mLs (30 g total) by mouth 3 (three) times daily. Qty: 240 mL, Refills: 0    Multiple Vitamin (MULTIVITAMIN WITH MINERALS) TABS tablet Take 1 tablet by mouth daily.    omeprazole (PRILOSEC) 20 MG capsule Take 20 mg by mouth 2 (two) times daily.    spironolactone (ALDACTONE) 100 MG tablet Take 100 mg by mouth daily.    Thiamine HCl (B-1) 250 MG TABS Take 1 tablet by mouth daily.    vitamin B-12 (CYANOCOBALAMIN) 1000 MCG tablet Take 1,000 mcg by mouth daily.       Allergies  Allergen Reactions  . Ultram [Tramadol] Other (See Comments)    Headache   Follow-up Information    Follow up with [facility].   Contact information:   [address] [place] [address] [phone]       Follow up with Ignatius SpeckingVYAS,DHRUV B., MD.   Specialty:  Internal Medicine   Contact information:   [address] [place] [address] [phone]  Follow up with REHMAN,NAJEEB U, MD In 1 month.   Specialty:  Gastroenterology   Why:  Follow up of cirrhosis   Contact information:   [address], [address] [place] [address] [phone]        The results of significant diagnostics from this hospitalization (including imaging, microbiology, ancillary and laboratory) are listed below for reference.    Significant Diagnostic Studies: Dg Chest 2 View  01/27/2015  CLINICAL DATA:  Pneumonia with cough. EXAM: CHEST  2 VIEW COMPARISON:  January 24, 2015. FINDINGS: The heart size and mediastinal contours are within normal limits. No  pneumothorax is noted. Mild bilateral pleural effusions are noted. Mild bibasilar opacities are noted most consistent with subsegmental atelectasis or possibly pneumonia. The visualized skeletal structures are unremarkable. IMPRESSION: Mild bibasilar opacities are noted most consistent with subsegmental atelectasis or possibly pneumonia. Mild bilateral pleural effusions are noted as well. Electronically Signed   By: Lupita Raider, M.D.   On: 01/27/2015 11:35   Dg Chest 2 View  01/24/2015  CLINICAL DATA:  59 year old female with acute fever and back pain today. EXAM: CHEST  2 VIEW COMPARISON:  08/09/2014 FINDINGS: The cardiomediastinal silhouette is unremarkable. Mild patchy bibasilar opacities are noted and suspicious for infection/ pneumonia. There is no evidence of pneumothorax, definite pleural effusion or suspicious nodule. No acute bony abnormalities are identified. IMPRESSION: Patchy bibasilar opacities suspicious for pneumonia/infection. Electronically Signed   By: Harmon Pier M.D.   On: 01/24/2015 22:09   US Abdomen Limited  01/25/2015  CLINICAL DATA:  Evaluate for ascites for paracentesis. Fever and chills. Cirrhosis. EXAM: LIMITED ABDOMEN ULTRASOUND FOR ASCITES TECHNIQUE: Limited ultrasound survey for ascites was performed in all four abdominal quadrants. COMPARISON:  Abdominal ultrasound 11/04/2014 FINDINGS: All 4 quadrants of the abdomen were evaluated. No ascites identified. IMPRESSION: No ascites identified. Electronically Signed   By: Annia Belt M.D.   On: 01/25/2015 13:32   Dg Shoulder Left Port  01/25/2015  CLINICAL DATA:  Left shoulder pain for 3 days with no known injury EXAM: LEFT SHOULDER - 1 VIEW COMPARISON:  None. FINDINGS: No fracture or dislocation.  Mild acromioclavicular arthritis. IMPRESSION: No acute findings Electronically Signed   By: Esperanza Heir M.D.   On: 01/25/2015 14:41    Microbiology: Recent Results (from the past 240 hour(s))  Culture, blood (routine x 2)      Status: None   Collection Time: 01/24/15  9:18 PM  Result Value Ref Range Status   Specimen Description BLOOD RIGHT ANTECUBITAL  Final   Special Requests BOTTLES DRAWN AEROBIC AND ANAEROBIC 10CC EACH  Final   Culture  Setup Time   Final    GRAM POSITIVE COCCI IN PAIRS RECOVERED FROM BOTH THE ANAEROBIC AND AEROBIC BOTTLES. Gram Stain Report Called to,Read Back By and Verified With: GRAY,M. AT 1245 ON 01/25/2015 BY BAUGHAM,M. Performed at [facility]    Culture   Final    STAPHYLOCOCCUS SPECIES (COAGULASE NEGATIVE) Performed at [facility]    Report Status 01/29/2015 FINAL  Final   Organism ID, Bacteria STAPHYLOCOCCUS SPECIES (COAGULASE NEGATIVE)  Final      Susceptibility   Staphylococcus species (coagulase negative) - MIC*    CIPROFLOXACIN >=8 RESISTANT Resistant     ERYTHROMYCIN >=8 RESISTANT Resistant     GENTAMICIN <=0.5 SENSITIVE Sensitive     OXACILLIN >=4 RESISTANT Resistant     TETRACYCLINE >=16 RESISTANT Resistant     VANCOMYCIN 2 SENSITIVE Sensitive     TRIMETH/SULFA  80 RESISTANT Resistant     CLINDAMYCIN <=0.25 SENSITIVE Sensitive     RIFAMPIN <=0.5 SENSITIVE Sensitive     Inducible Clindamycin NEGATIVE Sensitive     * STAPHYLOCOCCUS SPECIES (COAGULASE NEGATIVE)  Culture, blood (routine x 2)     Status: None   Collection Time: 01/24/15  9:32 PM  Result Value Ref Range Status   Specimen Description BLOOD LEFT HAND DRAWN BY RN  Final   Special Requests BOTTLES DRAWN AEROBIC ONLY 4CC  Final   Culture  Setup Time   Final    GRAM POSITIVE COCCI IN CLUSTERS RECOVERED FROM THE AEROBIC BOTTLE Gram Stain Report Called to,Read Back By and Verified With: NEILSON,T. AT 0615 ON 01/26/2015 BY RESSEGGER,R. Performed at [facility]    Culture   Final    STAPHYLOCOCCUS SPECIES (COAGULASE NEGATIVE) SUSCEPTIBILITIES PERFORMED ON PREVIOUS CULTURE WITHIN THE LAST 5 DAYS. Performed at [facility]    Report Status 01/29/2015 FINAL  Final  MRSA PCR  Screening     Status: None   Collection Time: 01/25/15 12:05 AM  Result Value Ref Range Status   MRSA by PCR NEGATIVE NEGATIVE Final    Comment:        The GeneXpert MRSA Assay (FDA approved for NASAL specimens only), is one component of a comprehensive MRSA colonization surveillance program. It is not intended to diagnose MRSA infection nor to guide or monitor treatment for MRSA infections.   Urine culture     Status: None   Collection Time: 01/25/15  9:49 AM  Result Value Ref Range Status   Specimen Description URINE, CATHETERIZED  Final   Special Requests NONE  Final   Culture   Final    NO GROWTH 1 DAY Performed at [facility]    Report Status 01/26/2015 FINAL  Final     Labs: Basic Metabolic Panel:  Recent Labs Lab 01/26/15 0431 01/27/15 0441 01/28/15 0551 01/29/15 0552 01/30/15 0625  NA 130* 132* 133* 134* 134*  K 3.5 3.6 3.4* 3.6 3.2*  CL 98* 100* 100* 99* 96*  CO2 GLUCOSE 137* 142* 139* 108* 96  BUN 28* 26* 21* 19 18  CREATININE 1.06* 1.15* 0.93 0.78 0.78  CALCIUM 7.9* 7.6* 7.8* 7.9* 8.2*   Liver Function Tests:  Recent Labs Lab 01/24/15 2118 01/26/15 0431 01/27/15 0441 01/28/15 0551 01/29/15 0552  AST 58* 47* 48* 51* 48*  ALT 37 31 30 34 33  ALKPHOS 168* 140* 185* 202* 162*  BILITOT 10.7* 6.5* 5.5* 5.8* 7.2*  PROT 6.0* 4.7* 4.7* 5.0* 5.1*  ALBUMIN 1.9* 1.5* 1.5* 1.6* 1.5*    Recent Labs Lab 01/24/15 2118  LIPASE 54*    Recent Labs Lab 01/24/15 2118 01/26/15 0431  AMMONIA 50* 51*   CBC:  Recent Labs Lab 01/24/15 2118  01/26/15 0431 01/27/15 0441 01/28/15 0551 01/29/15 0552 01/30/15 0625  WBC 13.8*  < > 14.4* 10.4 7.9 7.3 6.9  NEUTROABS 12.4*  --   --   --   --   --   --   HGB 12.3  < > 10.5* 10.3* 11.2* 11.0* 10.1*  HCT 35.8*  < > 30.8* 30.5* 33.1* 33.0* 29.8*  MCV 97.5  < > 98.1 98.4 98.2 98.5 98.0  PLT 57*  < > 50* 48* 50* 55* 51*  < > = values in this interval not displayed. Cardiac  Enzymes: No results for input(s): CKTOTAL, CKMB, CKMBINDEX, TROPONINI in the last 168 hours. BNP:  BNP (last 3 results)  Recent Labs  08/09/14 1459  BNP 178.0*    ProBNP (last 3 results) No results for input(s): PROBNP in the last 8760 hours.  CBG:  Recent Labs Lab 01/26/15 0712 01/26/15 1133 01/26/15 1543 01/26/15 2133 01/27/15 0744  GLUCAP 146* 167* 130* 140* 152*       Signed:  Harlei Lehrmann  [facility] 01/30/2015, 8:34 AM

## 2015-01-30 NOTE — Telephone Encounter (Signed)
I advised patient to call her nurse and let RN know she has nausea so hosp[facility] services may given her an Rx. I am not giving an Rx for Phenergan.

## 2015-01-30 NOTE — Progress Notes (Signed)
Patient initially agreed to stay for IV albumin and IV vancomycin. Patient is now refusing to stay for IV antibiotics or IV albumin. Home health unable to start antibiotics today. Patient and daughter aware she will miss 2 doses of antibiotics and that the infection she has is serious. Dr. Sherrie MustacheFisher and Dr. Karilyn Cotaehman aware.

## 2015-01-30 NOTE — Care Management (Signed)
Notified by pt's RN that she is leaving and no longer wants to stay to get her IV abx, she is also supposed to get albumin prior to DC. Alroy BailiffLinda Lothian, of [facility], made aware and she stated every effort would be made to get her IV abx this evening but she could not make any promises at this time. Information given to pt and importance of no missing abx doses emphasized. Pt stated she did not care if she missed 1 or 2 doses. She stated she wanted her albumin given at home. Writer explained to pt that that was not possible because albumin is a blood product and could not be administered in the home. Pt upset and stating "everyone is always finding a way to screw me over!" Explained to pt that these hospital/agency policies are in place to ensure safety for the patient.

## 2015-01-30 NOTE — Progress Notes (Signed)
Patient taken down for MRI. Unable to complete due to patient have claustrophobia.

## 2015-01-30 NOTE — Care Management Important Message (Signed)
Important Message  Patient Details  Name: Marissa Figueroa MRN: [id number] Date of Birth: [birth year]   Medicare Important Message Given:  Yes-third notification given    Malcolm MetroChildress, Saabir Blyth Demske, RN 01/30/2015, 9:44 AM

## 2015-02-03 LAB — CULTURE, BLOOD (ROUTINE X 2)
CULTURE: NO GROWTH
Culture: NO GROWTH

## 2015-02-04 ENCOUNTER — Emergency Department (HOSPITAL_COMMUNITY)
Admission: EM | Admit: 2015-02-04 | Discharge: 2015-02-04 | Disposition: A | Discharge status: Home / Self Care | Payer: Medicare Other | Attending: Emergency Medicine | Admitting: Emergency Medicine

## 2015-02-04 ENCOUNTER — Emergency Department (HOSPITAL_COMMUNITY): Payer: Medicare Other

## 2015-02-04 ENCOUNTER — Encounter (HOSPITAL_COMMUNITY): Payer: Self-pay | Admitting: *Deleted

## 2015-02-04 DIAGNOSIS — Z8619 Personal history of other infectious and parasitic diseases: Secondary | ICD-10-CM | POA: Insufficient documentation

## 2015-02-04 DIAGNOSIS — T82838A Hemorrhage of vascular prosthetic devices, implants and grafts, initial encounter: Secondary | ICD-10-CM | POA: Diagnosis present

## 2015-02-04 DIAGNOSIS — I509 Heart failure, unspecified: Secondary | ICD-10-CM | POA: Insufficient documentation

## 2015-02-04 DIAGNOSIS — Y712 Prosthetic and other implants, materials and accessory cardiovascular devices associated with adverse incidents: Secondary | ICD-10-CM | POA: Diagnosis not present

## 2015-02-04 DIAGNOSIS — R739 Hyperglycemia, unspecified: Secondary | ICD-10-CM | POA: Diagnosis not present

## 2015-02-04 DIAGNOSIS — Z791 Long term (current) use of non-steroidal anti-inflammatories (NSAID): Secondary | ICD-10-CM | POA: Insufficient documentation

## 2015-02-04 DIAGNOSIS — Z720 Tobacco use: Secondary | ICD-10-CM | POA: Insufficient documentation

## 2015-02-04 DIAGNOSIS — R05 Cough: Secondary | ICD-10-CM | POA: Diagnosis not present

## 2015-02-04 DIAGNOSIS — Z79899 Other long term (current) drug therapy: Secondary | ICD-10-CM | POA: Diagnosis not present

## 2015-02-04 DIAGNOSIS — Z8701 Personal history of pneumonia (recurrent): Secondary | ICD-10-CM | POA: Diagnosis not present

## 2015-02-04 DIAGNOSIS — R17 Unspecified jaundice: Secondary | ICD-10-CM | POA: Insufficient documentation

## 2015-02-04 DIAGNOSIS — Z8719 Personal history of other diseases of the digestive system: Secondary | ICD-10-CM | POA: Insufficient documentation

## 2015-02-04 LAB — CBC WITH DIFFERENTIAL/PLATELET
BASOS ABS: 0.1 10*3/uL (ref 0.0–0.1)
Basophils Relative: 1 %
EOS ABS: 0.1 10*3/uL (ref 0.0–0.7)
EOS PCT: 2 %
HCT: 29.4 % — ABNORMAL LOW (ref 36.0–46.0)
Hemoglobin: 9.8 g/dL — ABNORMAL LOW (ref 12.0–15.0)
Lymphocytes Relative: 16 %
Lymphs Abs: 1.4 10*3/uL (ref 0.7–4.0)
MCH: 33.4 pg (ref 26.0–34.0)
MCHC: 33.3 g/dL (ref 30.0–36.0)
MCV: 100.3 fL — ABNORMAL HIGH (ref 78.0–100.0)
Monocytes Absolute: 0.9 10*3/uL (ref 0.1–1.0)
Monocytes Relative: 10 %
Neutro Abs: 6.4 10*3/uL (ref 1.7–7.7)
Neutrophils Relative %: 72 %
PLATELETS: 50 10*3/uL — AB (ref 150–400)
RBC: 2.93 MIL/uL — AB (ref 3.87–5.11)
RDW: 17.7 % — AB (ref 11.5–15.5)
WBC: 8.8 10*3/uL (ref 4.0–10.5)

## 2015-02-04 LAB — COMPREHENSIVE METABOLIC PANEL
ALBUMIN: 1.7 g/dL — AB (ref 3.5–5.0)
ALT: 29 U/L (ref 14–54)
ANION GAP: 7 (ref 5–15)
AST: 52 U/L — ABNORMAL HIGH (ref 15–41)
Alkaline Phosphatase: 143 U/L — ABNORMAL HIGH (ref 38–126)
BILIRUBIN TOTAL: 8.3 mg/dL — AB (ref 0.3–1.2)
BUN: 14 mg/dL (ref 6–20)
CO2: 29 mmol/L (ref 22–32)
Calcium: 8.2 mg/dL — ABNORMAL LOW (ref 8.9–10.3)
Chloride: 96 mmol/L — ABNORMAL LOW (ref 101–111)
Creatinine, Ser: 0.86 mg/dL (ref 0.44–1.00)
GFR calc non Af Amer: >60 mL/min (ref >60)
GLUCOSE: 206 mg/dL — AB (ref 65–99)
POTASSIUM: 3.3 mmol/L — AB (ref 3.5–5.1)
SODIUM: 132 mmol/L — AB (ref 135–145)
TOTAL PROTEIN: 5.3 g/dL — AB (ref 6.5–8.1)

## 2015-02-04 LAB — PROTIME-INR
INR: 3.16 — ABNORMAL HIGH (ref 0.00–1.49)
PROTHROMBIN TIME: 31.8 s — AB (ref 11.6–15.2)

## 2015-02-04 LAB — AMMONIA: Ammonia: 79 umol/L — ABNORMAL HIGH (ref 9–35)

## 2015-02-04 MED ORDER — RIFAXIMIN 550 MG PO TABS: 550.0000 mg | ORAL_TABLET | Freq: Two times a day (BID) | ORAL | Status: AC

## 2015-02-04 NOTE — ED Notes (Signed)
Patient has PICC LINE IN HER RIGHT ARM AND IS CONCERNED IT IS NOT WORKING PROPERLY

## 2015-02-04 NOTE — ED Provider Notes (Signed)
CSN: [id number]     Arrival date & time 02/04/15  1501 History   First MD Initiated Contact with Patient 02/04/15 1518     Chief Complaint  Patient presents with  . Vascular Access Problem     (Consider location/radiation/quality/duration/timing/severity/associated sxs/prior Treatment) HPI Comments: Patient from home with bleeding around her PICC line. There is a PICC in her right upper extremity since her hospital discharge on October 17 for pneumonia with bacteremia. She's been using vancomycin twice daily. She states the PICC line is still working and last used today. She states there has been some oozing around his PICC site daily for the past 3 days. She denies direct trauma. Her home care nurse wanted to have her to have it checked as she was worried that was out of place. Patient with a history of cirrhosis and chronic jaundice and liver disease. Patient denies any fever. She still has a cough of white mucus. She denies any nausea, vomiting, abdominal pain, chest pain or shortness of breath. She states her breathing is fine. She has another 5 days scheduled of vancomycin for coagulase-negative bacteremia.  The history is provided by the patient and a relative.    Past Medical History  Diagnosis Date  . Cirrhosis (HCC)     ALCOHOLIC  . Alcoholic cirrhosis of liver (HCC)   . CHF (congestive heart failure) (HCC)   . Shortness of breath     exertion  . Community acquired pneumonia 01/24/2015  . Gram-positive bacteremia 01/26/2015    Coagulase-negative Staphylococcus bacteremia with sepsis  . Diastolic dysfunction 01/27/2015  . Pulmonary hypertension (HCC) 01/27/2015   Past Surgical History  Procedure Laterality Date  . Tubal ligation    . Tonsilectomy, adenoidectomy, bilateral myringotomy and tubes    . Paracentesis  07/06/08  . Paracentesis  05/25/2008  . Esophagogastroduodenoscopy (egd) with propofol N/A 08/15/2014    Procedure: ESOPHAGOGASTRODUODENOSCOPY (EGD) WITH PROPOFOL;   Surgeon: Malissa HippoNajeeb U Rehman, MD;  Location: [facility];  Service: Endoscopy;  Laterality: N/A;   No family history on file. Social History  Substance Use Topics  . Smoking status: Current Some Day Smoker -- 0.25 packs/day for 35 years    Types: Cigarettes  . Smokeless tobacco: Never Used     Comment: Smokes 4 cigarettes per day  . Alcohol Use: No   OB History    No data available     Review of Systems  Constitutional: Negative for fever, activity change and appetite change.  HENT: Negative for congestion and rhinorrhea.   Respiratory: Negative for cough, chest tightness and shortness of breath.   Cardiovascular: Negative for chest pain.  Gastrointestinal: Negative for nausea, vomiting and abdominal pain.  Genitourinary: Negative for dysuria and hematuria.  Musculoskeletal: Negative for myalgias and arthralgias.  Skin: Positive for wound. Negative for rash.  Neurological: Negative for dizziness, weakness and headaches.  A complete 10 system review of systems was obtained and all systems are negative except as noted in the HPI and PMH.      Allergies  Ultram  Home Medications   Prior to Admission medications   Medication Sig Start Date End Date Taking? Authorizing Provider  B Complex-Biotin-FA (B-COMPLEX PO) Take 1 tablet by mouth daily.   Yes Historical Provider, MD  cholecalciferol (VITAMIN D) 1000 UNITS tablet Take 1,000 Units by mouth daily.   Yes Historical Provider, MD  folic acid (FOLVITE) 1 MG tablet Take 1 tablet (1 mg total) by mouth daily. 10/20/14  Yes Len Blalockerri L Setzer, NP  furosemide (LASIX) 40 MG tablet Take 1 tablet (40 mg total) by mouth daily. Patient taking differently: Take 40 mg by mouth 2 (two) times daily.  10/25/14  Yes Len Blalock, NP  lactulose (CHRONULAC) 10 GM/15ML solution Take 45 mLs (30 g total) by mouth 3 (three) times daily. 12/27/14  Yes [name] Isaiah Blakes, MD  metolazone (ZAROXOLYN) 5 MG tablet Take 1 tablet (5 mg total) by mouth once. Take only  as needed every 2-3 days for increased swelling. 01/30/15  Yes Elliot Cousin, MD  Multiple Vitamin (MULTIVITAMIN WITH MINERALS) TABS tablet Take 1 tablet by mouth daily. 08/18/14  Yes Henderson Cloud, MD  omeprazole (PRILOSEC) 20 MG capsule Take 20 mg by mouth 2 (two) times daily.   Yes Historical Provider, MD  potassium chloride SA (KLOR-CON M20) 20 MEQ tablet Take 2 tablets (40 mEq total) by mouth daily. Patient taking differently: Take 20 mEq by mouth daily.  01/30/15  Yes Elliot Cousin, MD  protein supplement shake (PREMIER PROTEIN) LIQD Take 11 oz by mouth every other day.   Yes Historical Provider, MD  spironolactone (ALDACTONE) 100 MG tablet Take 100 mg by mouth daily.   Yes Historical Provider, MD  tetrahydrozoline 0.05 % ophthalmic solution Place 1 drop into both eyes daily.   Yes Historical Provider, MD  Thiamine HCl (B-1) 250 MG TABS Take 1 tablet by mouth daily.   Yes Historical Provider, MD  vancomycin in sodium chloride 0.9 % 250 mL Inject 1,250 mg into the vein every 12 (twelve) hours.   Yes Historical Provider, MD  vitamin B-12 (CYANOCOBALAMIN) 1000 MCG tablet Take 1,000 mcg by mouth daily.   Yes Historical Provider, MD  diclofenac sodium (VOLTAREN) 1 % GEL Apply 2 g topically 3 (three) times daily. Applied to left shoulder 01/30/15   Elliot Cousin, MD  rifaximin (XIFAXAN) 550 MG TABS tablet Take 1 tablet (550 mg total) by mouth 2 (two) times daily. 02/04/15   Glynn Octave, MD  Vancomycin (VANCOCIN) 750 MG/150ML SOLN Inject 150 mLs (750 mg total) into the vein every 12 (twelve) hours. Continue as prescribed and discontinue after last dose on 02/09/15. 01/30/15   Elliot Cousin, MD   BP 118/52 mmHg  Pulse 85  Temp(Src) 98.3 F (36.8 C) (Oral)  Resp 18  Ht  (1.727 m)  Wt 240 lb (108.863 kg)  BMI 36.50 kg/m2  SpO2 100% Physical Exam  Constitutional: She is oriented to person, place, and time. She appears well-developed and well-nourished. No distress.  HENT:   Head: Normocephalic and atraumatic.  Mouth/Throat: Oropharynx is clear and moist. No oropharyngeal exudate.  Eyes: Conjunctivae and EOM are normal. Pupils are equal, round, and reactive to light. Scleral icterus is present.  Jaundice at baseline per famliy  Neck: Normal range of motion. Neck supple.  No meningismus.  Cardiovascular: Normal rate, regular rhythm, normal heart sounds and intact distal pulses.   No murmur heard. Pulmonary/Chest: Effort normal and breath sounds normal. No respiratory distress.  Abdominal: Soft. There is no tenderness. There is no rebound and no guarding.  Musculoskeletal: Normal range of motion. She exhibits tenderness. She exhibits no edema.  PICC line RUE. No surrounding induration, erythema or fluctuance. There is no active bleeding. There is blood on the patient's dressing. The PICC line draws without difficulty and flushes.  Neurological: She is alert and oriented to person, place, and time. No cranial nerve deficit. She exhibits normal muscle tone. Coordination normal.  No ataxia on finger to nose  bilaterally. No pronator drift. 5/5 strength throughout. CN 2-12 intact. Negative Romberg. Equal grip strength. Sensation intact. Gait is normal.   Skin: Skin is warm.  Psychiatric: She has a normal mood and affect. Her behavior is normal.  Nursing note and vitals reviewed.   ED Course  Procedures (including critical care time) Labs Review Labs Reviewed  CBC WITH DIFFERENTIAL/PLATELET - Abnormal; Notable for the following:    RBC 2.93 (*)    Hemoglobin 9.8 (*)    HCT 29.4 (*)    MCV 100.3 (*)    RDW 17.7 (*)    Platelets 50 (*)    All other components within normal limits  PROTIME-INR - Abnormal; Notable for the following:    Prothrombin Time 31.8 (*)    INR 3.16 (*)    All other components within normal limits  COMPREHENSIVE METABOLIC PANEL - Abnormal; Notable for the following:    Sodium 132 (*)    Potassium 3.3 (*)    Chloride 96 (*)     Glucose, Bld 206 (*)    Calcium 8.2 (*)    Total Protein 5.3 (*)    Albumin 1.7 (*)    AST 52 (*)    Alkaline Phosphatase 143 (*)    Total Bilirubin 8.3 (*)    All other components within normal limits  AMMONIA - Abnormal; Notable for the following:    Ammonia 79 (*)    All other components within normal limits    Imaging Review Dg Chest 2 View  02/04/2015  CLINICAL DATA:  PICC placement EXAM: CHEST  2 VIEW COMPARISON:  01/27/2015 FINDINGS: Lungs are clear. Suspected small pleural effusion on the lateral view. No pneumothorax. The heart is top-normal in size. Right arm PICC terminates at the cavoatrial junction. IMPRESSION: Right arm PICC terminates at the cavoatrial junction. Electronically Signed   By: Charline Bills M.D.   On: 02/04/2015 16:26   I have personally reviewed and evaluated these images and lab results as part of my medical decision-making.   EKG Interpretation None      MDM   Final diagnoses:  Bleeding from PICC line, initial encounter [facility])   Patient with concerns of bleeding from PICC line. Still flushes and draws without difficulty. No fever.  No leukocytosis or fever. X-ray shows a PICC line in appropriate position. PICC nurse has evaluated the line as well and confirms that it is in the appropriate place and functioning well.  Labs shows stable anemia, and thrombocytopenia and hyperbilirubinemia. INR is 3.1 which is also stable. Patient has elevated ammonia level but is not encephalopathic. Her laboratory studies are consistent with her history of alcoholic cirrhosis and liver disease. No abdominal pain.   Patient's daughter states that she never filled the rifaximin because it was on the same prescription as the vancomycin and the pharmacy would not fill it. Patient states compliance with her lactulose but does not like it because it causes diarrhea.  Patient's PICC is stable for continued use. She has another 5 days of antibiotics. Follow-up with her  PCP. Return precautions discussed. Rifaximin prescription rewritten.  BP 118/52 mmHg  Pulse 85  Temp(Src) 98.3 F (36.8 C) (Oral)  Resp 18  Ht  (1.727 m)  Wt 240 lb (108.863 kg)  BMI 36.50 kg/m2  SpO2 100%   Glynn Octave, MD 02/04/15 1755

## 2015-02-04 NOTE — Discharge Instructions (Signed)
PICC Home Guide Follow up with your doctor for PICC line removal as scheduled. Return to the ED if you develop new or worsening symptoms. A peripherally inserted central catheter (PICC) is a long, thin, flexible tube that is inserted into a vein in the upper arm. It is a form of intravenous (IV) access. It is considered to be a "central" line because the tip of the PICC ends in a large vein in your chest. This large vein is called the superior vena cava (SVC). The PICC tip ends in the SVC because there is a lot of blood flow in the SVC. This allows medicines and IV fluids to be quickly distributed throughout the body. The PICC is inserted using a sterile technique by a specially trained nurse or physician. After the PICC is inserted, a chest X-ray exam is done to be sure it is in the correct place.  A PICC may be placed for different reasons, such as:  To give medicines and liquid nutrition that can only be given through a central line. Examples are:  Certain antibiotic treatments.  Chemotherapy.  Total parenteral nutrition (TPN).  To take frequent blood samples.  To give IV fluids and blood products.  If there is difficulty placing a peripheral intravenous (PIV) catheter. If taken care of properly, a PICC can remain in place for several months. A PICC can also allow a person to go home from the hospital early. Medicine and PICC care can be managed at home by a family member or home health care team. WHAT PROBLEMS CAN HAPPEN WHEN I HAVE A PICC? Problems with a PICC can occasionally occur. These may include the following:  A blood clot (thrombus) forming in or at the tip of the PICC. This can cause the PICC to become clogged. A clot-dissolving medicine called tissue plasminogen activator (tPA) can be given through the PICC to help break up the clot.  Inflammation of the vein (phlebitis) in which the PICC is placed. Signs of inflammation may include redness, pain at the insertion site, red  streaks, or being able to feel a "cord" in the vein where the PICC is located.  Infection in the PICC or at the insertion site. Signs of infection may include fever, chills, redness, swelling, or pus drainage from the PICC insertion site.  PICC movement (malposition). The PICC tip may move from its original position due to excessive physical activity, forceful coughing, sneezing, or vomiting.  A break or cut in the PICC. It is important to not use scissors near the PICC.  Nerve or tendon irritation or injury during PICC insertion. WHAT SHOULD I KEEP IN MIND ABOUT ACTIVITIES WHEN I HAVE A PICC?  You may bend your arm and move it freely. If your PICC is near or at the bend of your elbow, avoid activity with repeated motion at the elbow.  Rest at home for the remainder of the day following PICC line insertion.  Avoid lifting heavy objects as instructed by your health care provider.  Avoid using a crutch with the arm on the same side as your PICC. You may need to use a walker. WHAT SHOULD I KNOW ABOUT MY PICC DRESSING?  Keep your PICC bandage (dressing) clean and dry to prevent infection.  Ask your health care provider when you may shower. Ask your health care provider to teach you how to wrap the PICC when you do take a shower.  Change the PICC dressing as instructed by your health care provider.  Change  your PICC dressing if it becomes loose or wet. WHAT SHOULD I KNOW ABOUT PICC CARE?  Check the PICC insertion site daily for leakage, redness, swelling, or pain.  Do not take a bath, swim, or use hot tubs when you have a PICC. Cover PICC line with clear plastic wrap and tape to keep it dry while showering.  Flush the PICC as directed by your health care provider. Let your health care provider know right away if the PICC is difficult to flush or does not flush. Do not use force to flush the PICC.  Do not use a syringe that is less than 10 mL to flush the PICC.  Never pull or tug on  the PICC.  Avoid blood pressure checks on the arm with the PICC.  Keep your PICC identification card with you at all times.  Do not take the PICC out yourself. Only a trained clinical professional should remove the PICC. SEEK IMMEDIATE MEDICAL CARE IF:  Your PICC is accidentally pulled all the way out. If this happens, cover the insertion site with a bandage or gauze dressing. Do not throw the PICC away. Your health care provider will need to inspect it.  Your PICC was tugged or pulled and has partially come out. Do not  push the PICC back in.  There is any type of drainage, redness, or swelling where the PICC enters the skin.  You cannot flush the PICC, it is difficult to flush, or the PICC leaks around the insertion site when it is flushed.  You hear a "flushing" sound when the PICC is flushed.  You have pain, discomfort, or numbness in your arm, shoulder, or jaw on the same side as the PICC.  You feel your heart "racing" or skipping beats.  You notice a hole or tear in the PICC.  You develop chills or a fever. MAKE SURE YOU:   Understand these instructions.  Will watch your condition.  Will get help right away if you are not doing well or get worse.   This information is not intended to replace advice given to you by your health care provider. Make sure you discuss any questions you have with your health care provider.   Document Released: 10/06/2002 Document Revised: 04/22/2014 Document Reviewed: 12/07/2012 Elsevier Interactive Patient Education [employer]2016 [employer]

## 2015-02-09 ENCOUNTER — Other Ambulatory Visit (INDEPENDENT_AMBULATORY_CARE_PROVIDER_SITE_OTHER): Payer: Self-pay | Admitting: *Deleted

## 2015-02-09 ENCOUNTER — Encounter (INDEPENDENT_AMBULATORY_CARE_PROVIDER_SITE_OTHER): Payer: Self-pay | Admitting: *Deleted

## 2015-02-09 DIAGNOSIS — K7031 Alcoholic cirrhosis of liver with ascites: Secondary | ICD-10-CM

## 2015-02-14 ENCOUNTER — Telehealth (INDEPENDENT_AMBULATORY_CARE_PROVIDER_SITE_OTHER): Payer: Self-pay | Admitting: *Deleted

## 2015-02-14 NOTE — Telephone Encounter (Signed)
[name] called to let Marissa Figueroa know, Marissa Figueroa will not get up to go have blood work done. She is refusing, stating she doesn't feel like it. Her apt is tomorrow, 02/15/15. The return phone number is [phone].

## 2015-02-15 ENCOUNTER — Ambulatory Visit (INDEPENDENT_AMBULATORY_CARE_PROVIDER_SITE_OTHER): Payer: Medicare Other | Admitting: Internal Medicine

## 2015-02-15 ENCOUNTER — Encounter (INDEPENDENT_AMBULATORY_CARE_PROVIDER_SITE_OTHER): Payer: Self-pay | Admitting: Internal Medicine

## 2015-02-15 VITALS — BP 100/52 | HR 80 | Temp 98.0°F | Ht 68.0 in | Wt 221.4 lb

## 2015-02-15 DIAGNOSIS — K7030 Alcoholic cirrhosis of liver without ascites: Secondary | ICD-10-CM

## 2015-02-15 NOTE — Progress Notes (Signed)
Subjective:    Patient ID: Marissa Figueroa, female    DOB: [birth year], 59 y.o.   MRN: [id number]  HPI Here today for f/u of her alcoholic cirrhosis. Patient is extremely jaundiced today. She did not have her blood work drawn. Recently in hospital last month with pneumonia. Had been receiving Vancomycin 750mg  BID via PICC. She is finished Vancomycin 02/09/2015. Her left arm is swollen.  She tells me she feels good. There is no pain.  Yesterday her daughters tell me she was confused.  Patient condition appears to be deteriorating.  Today her weight is 221.4. October 22 her weight was 240.  She is trying to eat.  She is having about 3 BMs a day. Her stools formed. She has been incontinent.     Review of Systems Past Medical History  Diagnosis Date  . Cirrhosis (HCC)     ALCOHOLIC  . Alcoholic cirrhosis of liver (HCC)   . CHF (congestive heart failure) (HCC)   . Shortness of breath     exertion  . Community acquired pneumonia 01/24/2015  . Gram-positive bacteremia 01/26/2015    Coagulase-negative Staphylococcus bacteremia with sepsis  . Diastolic dysfunction 01/27/2015  . Pulmonary hypertension (HCC) 01/27/2015    Past Surgical History  Procedure Laterality Date  . Tubal ligation    . Tonsilectomy, adenoidectomy, bilateral myringotomy and tubes    . Paracentesis  07/06/08  . Paracentesis  05/25/2008  . Esophagogastroduodenoscopy (egd) with propofol N/A 08/15/2014    Procedure: ESOPHAGOGASTRODUODENOSCOPY (EGD) WITH PROPOFOL;  Surgeon: Malissa HippoNajeeb U Rehman, MD;  Location: [facility];  Service: Endoscopy;  Laterality: N/A;    Allergies  Allergen Reactions  . Ultram [Tramadol] Other (See Comments)    Headache    Current Outpatient Prescriptions on File Prior to Visit  Medication Sig Dispense Refill  . B Complex-Biotin-FA (B-COMPLEX PO) Take 1 tablet by mouth daily.    . cholecalciferol (VITAMIN D) 1000 UNITS tablet Take 1,000 Units by mouth daily.    . diclofenac sodium  (VOLTAREN) 1 % GEL Apply 2 g topically 3 (three) times daily. Applied to left shoulder 100 g 2  . folic acid (FOLVITE) 1 MG tablet Take 1 tablet (1 mg total) by mouth daily.    . furosemide (LASIX) 40 MG tablet Take 1 tablet (40 mg total) by mouth daily. (Patient taking differently: Take 40 mg by mouth 2 (two) times daily. ) 30 tablet 3  . lactulose (CHRONULAC) 10 GM/15ML solution Take 45 mLs (30 g total) by mouth 3 (three) times daily. 240 mL 0  . metolazone (ZAROXOLYN) 5 MG tablet Take 1 tablet (5 mg total) by mouth once. Take only as needed every 2-3 days for increased swelling. 30 tablet 1  . Multiple Vitamin (MULTIVITAMIN WITH MINERALS) TABS tablet Take 1 tablet by mouth daily.    [name] Kitchen. omeprazole (PRILOSEC) 20 MG capsule Take 20 mg by mouth 2 (two) times daily.    . potassium chloride SA (KLOR-CON M20) 20 MEQ tablet Take 2 tablets (40 mEq total) by mouth daily. (Patient taking differently: Take 20 mEq by mouth daily. )    . protein supplement shake (PREMIER PROTEIN) LIQD Take 11 oz by mouth every other day.    . rifaximin (XIFAXAN) 550 MG TABS tablet Take 1 tablet (550 mg total) by mouth 2 (two) times daily. 60 tablet 0  . spironolactone (ALDACTONE) 100 MG tablet Take 100 mg by mouth daily.    [name] Kitchen. tetrahydrozoline 0.05 % ophthalmic solution Place 1 drop into  both eyes daily.    . Thiamine HCl (B-1) 250 MG TABS Take 1 tablet by mouth daily.    . Vancomycin (VANCOCIN) 750 MG/150ML SOLN Inject 150 mLs (750 mg total) into the vein every 12 (twelve) hours. Continue as prescribed and discontinue after last dose on 02/09/15. 4000 mL 1  . vancomycin in sodium chloride 0.9 % 250 mL Inject 1,250 mg into the vein every 12 (twelve) hours.    . vitamin B-12 (CYANOCOBALAMIN) 1000 MCG tablet Take 1,000 mcg by mouth daily.     No current facility-administered medications on file prior to visit.        Objective:   Physical Exam Blood pressure 100/52, pulse 80, temperature 98 F (36.7 C), height  (1.727  m), weight 221 lb 6.4 oz (100.426 kg). Alert and oriented. Skin warm and dry. Oral mucosa is moist.   . Sclera icteric, conjunctivae is pink. Thyroid not enlarged. No cervical lymphadenopathy. Lungs clear. Heart regular rate and rhythm.  Abdomen is soft. Bowel sounds are positive. No hepatomegaly. No abdominal masses felt. No tenderness. 3-4+  edema to lower extremities.   Left arm is swollen.        Assessment & Plan:  Decompensated alcoholic liver disease. Needs to go have labs drawn. Patient's condition appears grave. OV 6 weeks.

## 2015-02-15 NOTE — Telephone Encounter (Signed)
Addressed.

## 2015-02-15 NOTE — Patient Instructions (Signed)
Labs today. OV in 6 weeks. 

## 2015-02-16 LAB — AMMONIA: AMMONIA: 67 umol/L — AB (ref 16–53)

## 2015-02-16 LAB — COMPREHENSIVE METABOLIC PANEL
ALT: 23 U/L (ref 6–29)
AST: 43 U/L — ABNORMAL HIGH (ref 10–35)
Albumin: 1.9 g/dL — ABNORMAL LOW (ref 3.6–5.1)
Alkaline Phosphatase: 138 U/L — ABNORMAL HIGH (ref 33–130)
BUN: 53 mg/dL — ABNORMAL HIGH (ref 7–25)
CALCIUM: 9 mg/dL (ref 8.6–10.4)
CO2: 29 mmol/L (ref 20–31)
Chloride: 96 mmol/L — ABNORMAL LOW (ref 98–110)
Creat: 3.44 mg/dL — ABNORMAL HIGH (ref 0.50–1.05)
Glucose, Bld: 160 mg/dL — ABNORMAL HIGH (ref 65–99)
POTASSIUM: 4.2 mmol/L (ref 3.5–5.3)
Sodium: 135 mmol/L (ref 135–146)
TOTAL PROTEIN: 6.1 g/dL (ref 6.1–8.1)
Total Bilirubin: 15.1 mg/dL — ABNORMAL HIGH (ref 0.2–1.2)

## 2015-02-16 LAB — CBC
HEMATOCRIT: 25.6 % — AB (ref 36.0–46.0)
Hemoglobin: 8.6 g/dL — ABNORMAL LOW (ref 12.0–15.0)
MCH: 33.5 pg (ref 26.0–34.0)
MCHC: 33.6 g/dL (ref 30.0–36.0)
MCV: 99.6 fL (ref 78.0–100.0)
MPV: 9.2 fL (ref 8.6–12.4)
Platelets: 75 10*3/uL — ABNORMAL LOW (ref 150–400)
RBC: 2.57 MIL/uL — ABNORMAL LOW (ref 3.87–5.11)
RDW: 18.5 % — AB (ref 11.5–15.5)
WBC: 9.3 10*3/uL (ref 4.0–10.5)

## 2015-02-16 LAB — PROTIME-INR
INR: 2.66 — AB (ref <1.50)
PROTHROMBIN TIME: 28.7 s — AB (ref 11.6–15.2)

## 2015-03-16 DEATH — deceased

## 2015-03-29 ENCOUNTER — Ambulatory Visit (INDEPENDENT_AMBULATORY_CARE_PROVIDER_SITE_OTHER): Payer: Medicare Other | Admitting: Internal Medicine

## 2016-02-28 IMAGING — US US EXTREM  UP VENOUS*R*
1 series · 13 of 24 positions shown · non-contrast
Comparison: None.

CLINICAL DATA: Right upper arm to hand swelling for several days.
History of cirrhosis.



[Series 1: us extrem up venous*right* · 0.06mm/px · 13 of 53 slices shown]
[im 1/53]
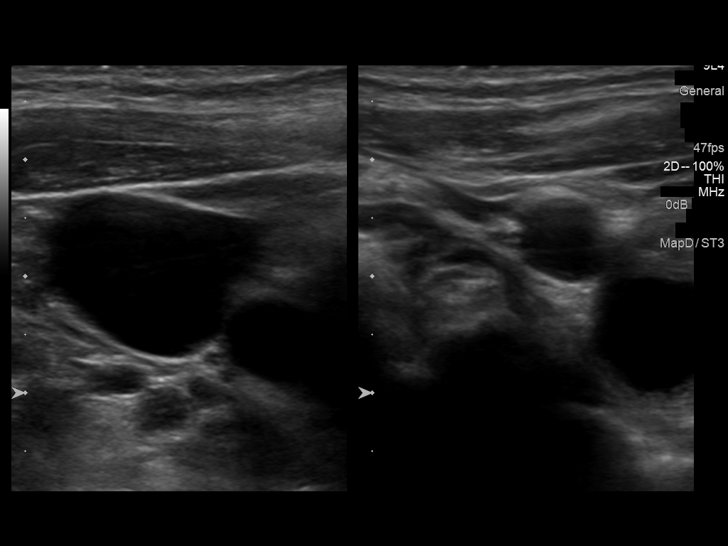
[im 5/53]
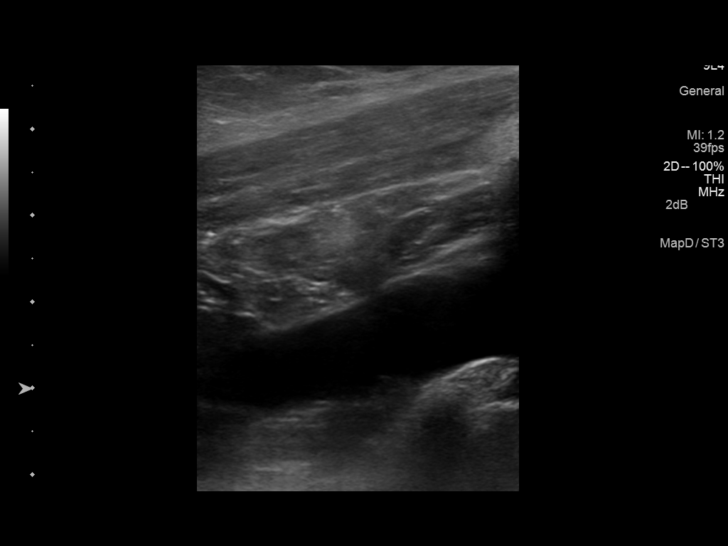
[im 10/53]
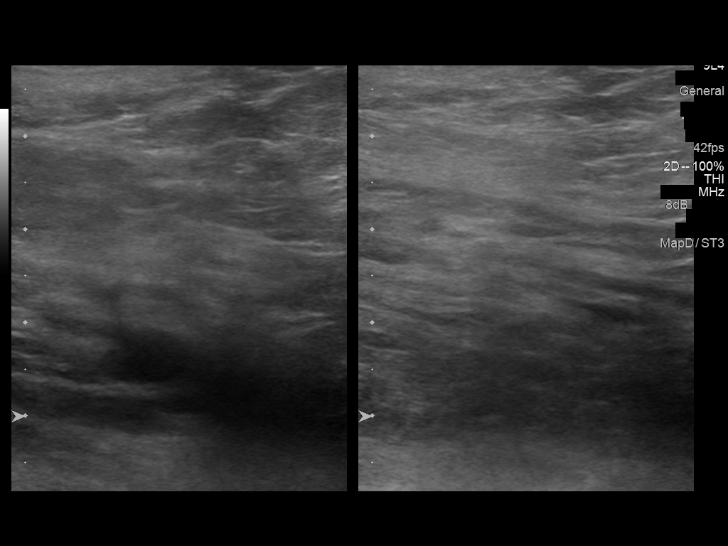
[im 14/53]
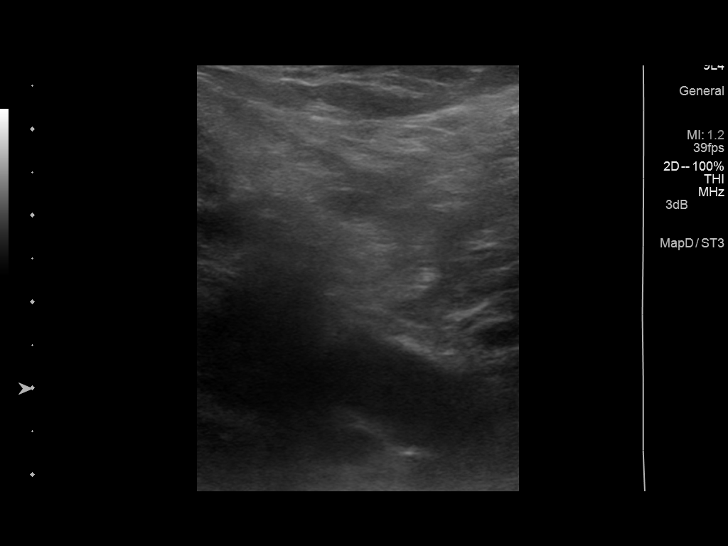
[im 19/53]
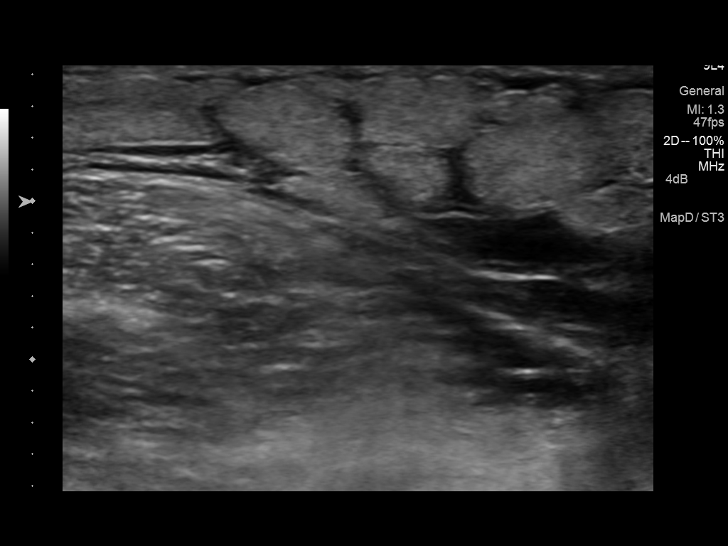
[im 23/53]
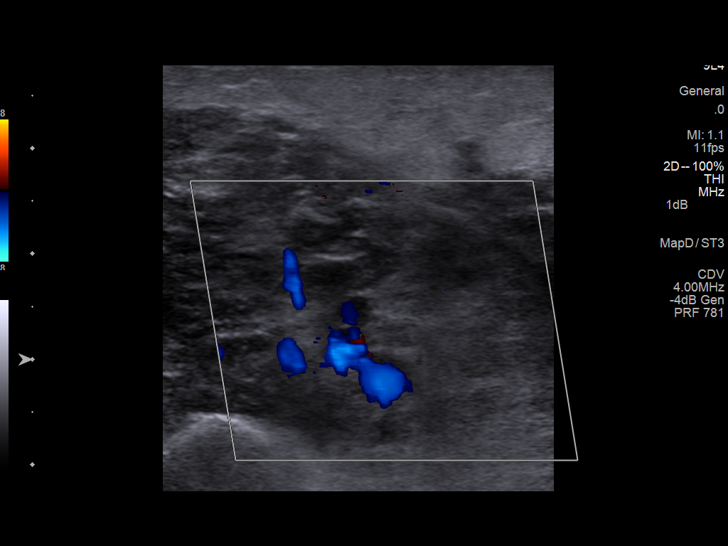
[im 28/53]
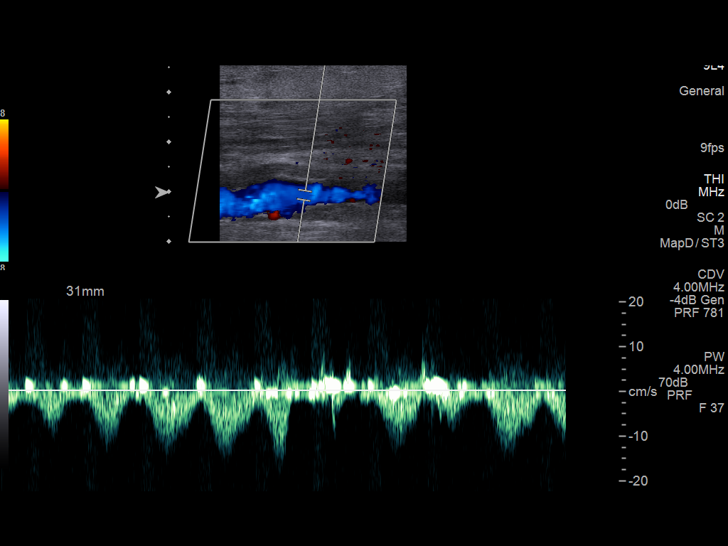
[im 30/53]
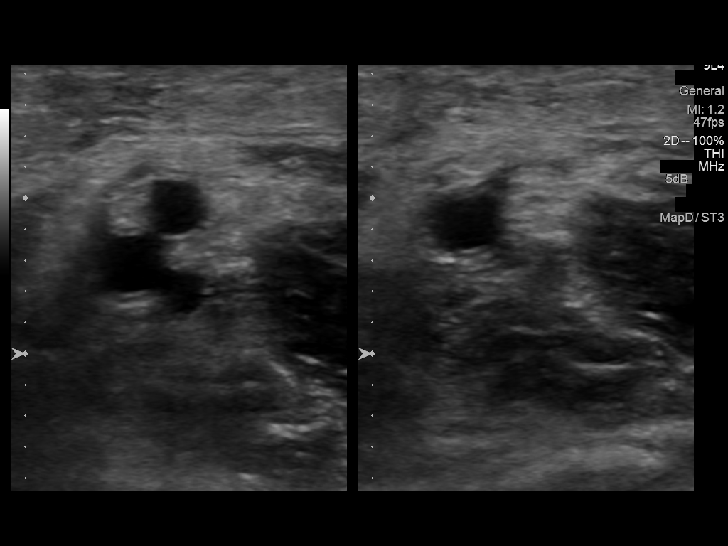
[im 34/53]
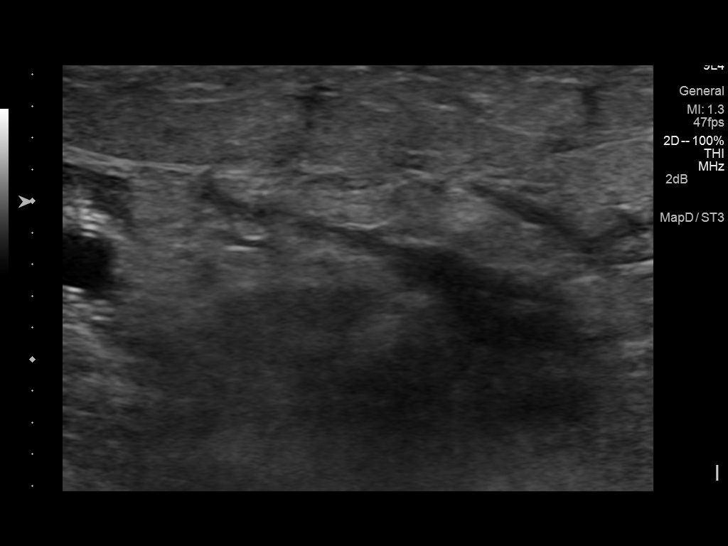
[im 39/53]
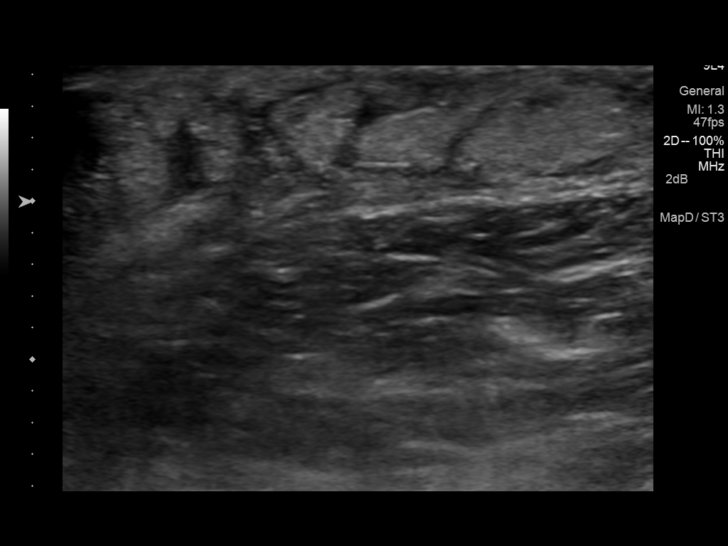
[im 43/53]
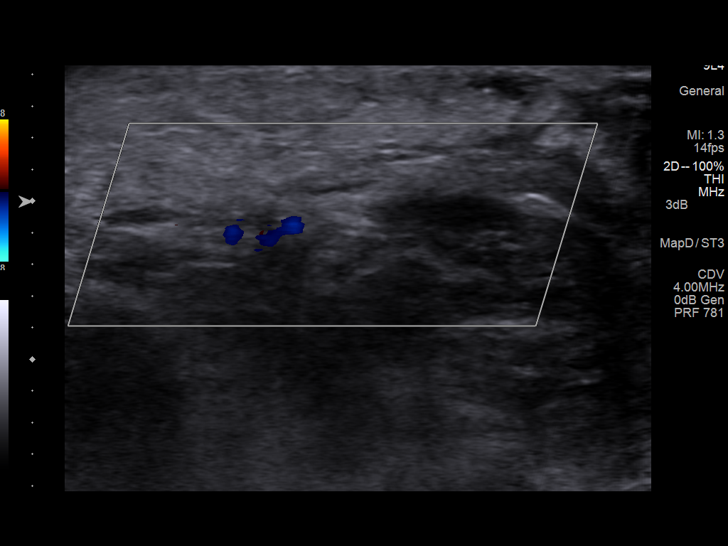
[im 48/53]
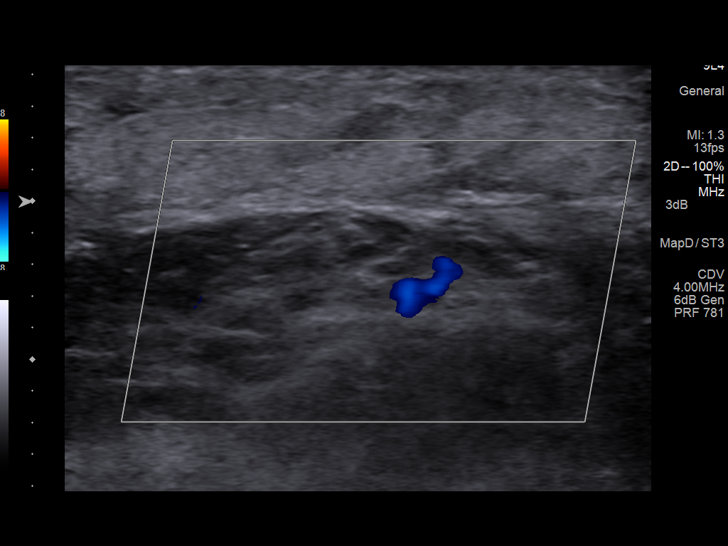
[im 53/53]
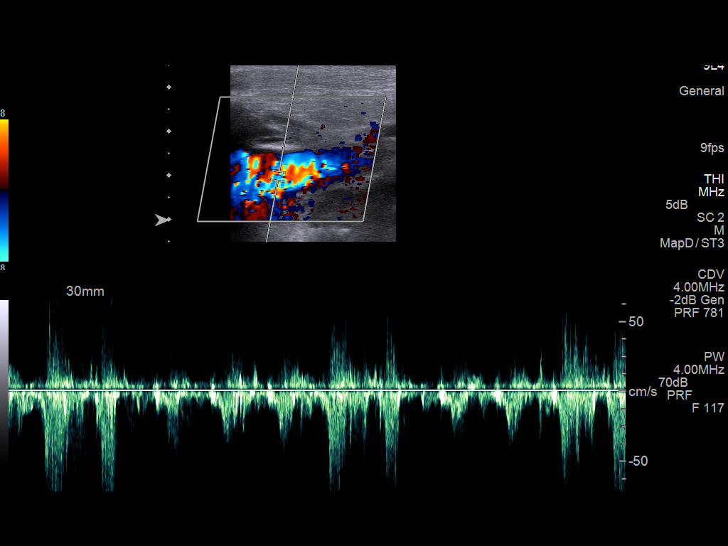

[13 of 24 positions shown; findings below may reference images not displayed]

FINDINGS: Contralateral Subclavian Vein: Respiratory phasicity is normal and
symmetric with the symptomatic side. No evidence of thrombus. Normal
compressibility.

Internal Jugular Vein: No evidence of thrombus. Normal
compressibility, respiratory phasicity and response to augmentation.

Subclavian Vein: No evidence of thrombus. Normal compressibility,
respiratory phasicity and response to augmentation.

Axillary Vein: No evidence of thrombus. Normal compressibility,
respiratory phasicity and response to augmentation.

Cephalic Vein: No evidence of thrombus. Normal compressibility,
respiratory phasicity and response to augmentation.

Basilic Vein: No duct thrombus.  Not well visualized

Brachial Veins: No evidence of thrombus. Normal compressibility,
respiratory phasicity and response to augmentation.

Radial Veins: No duct thrombus.  Not well visualized.

Ulnar Veins: No definite thrombus.  Not well visualized.

Venous Reflux:  None visualized.

Other Findings:  Diffuse soft tissue edema most evident forearm.
IMPRESSION: No evidence of deep venous thrombosis.

## 2016-03-03 IMAGING — DX DG ELBOW COMPLETE 3+V*R*
4 series · 4 of 4 positions shown · non-contrast
Comparison: None.

CLINICAL DATA: Right elbow pain and bruising, fall 5 days ago

EXAM:
RIGHT ELBOW - COMPLETE 3+ VIEW

[elbow ap]
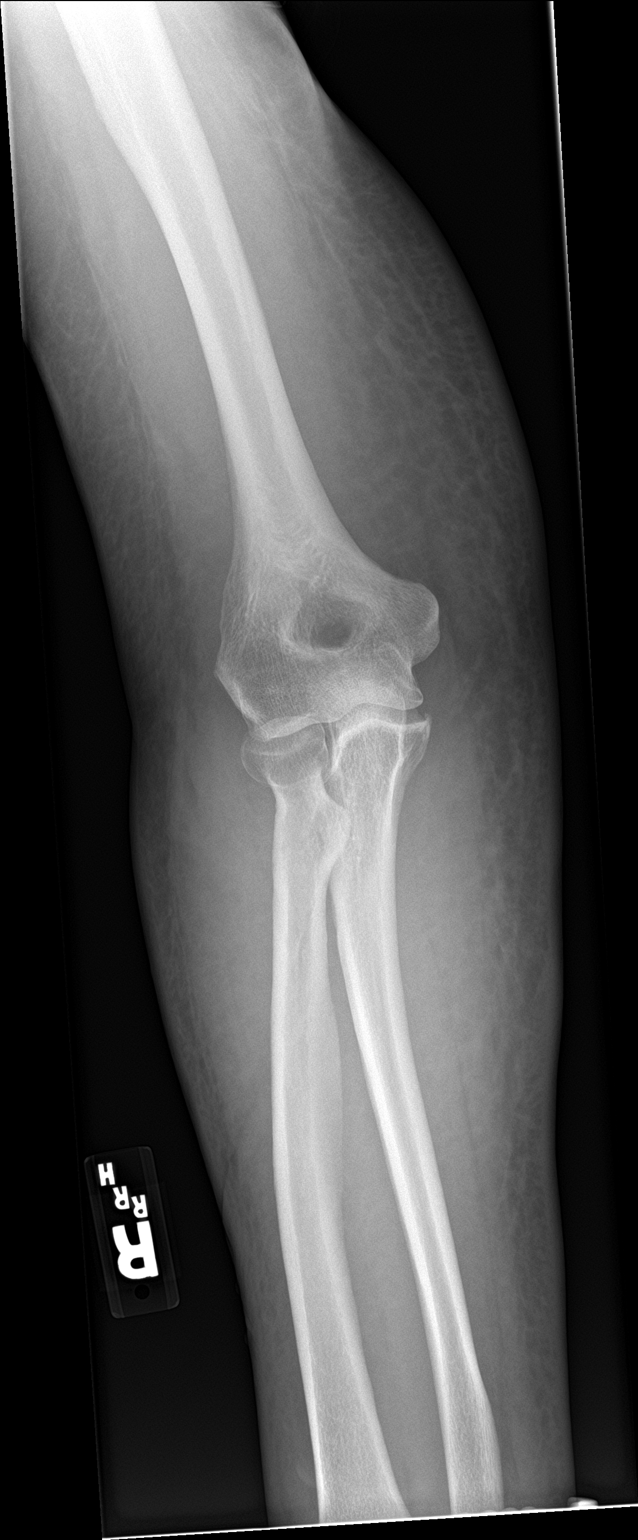

[elbow obl (1 of 2)]
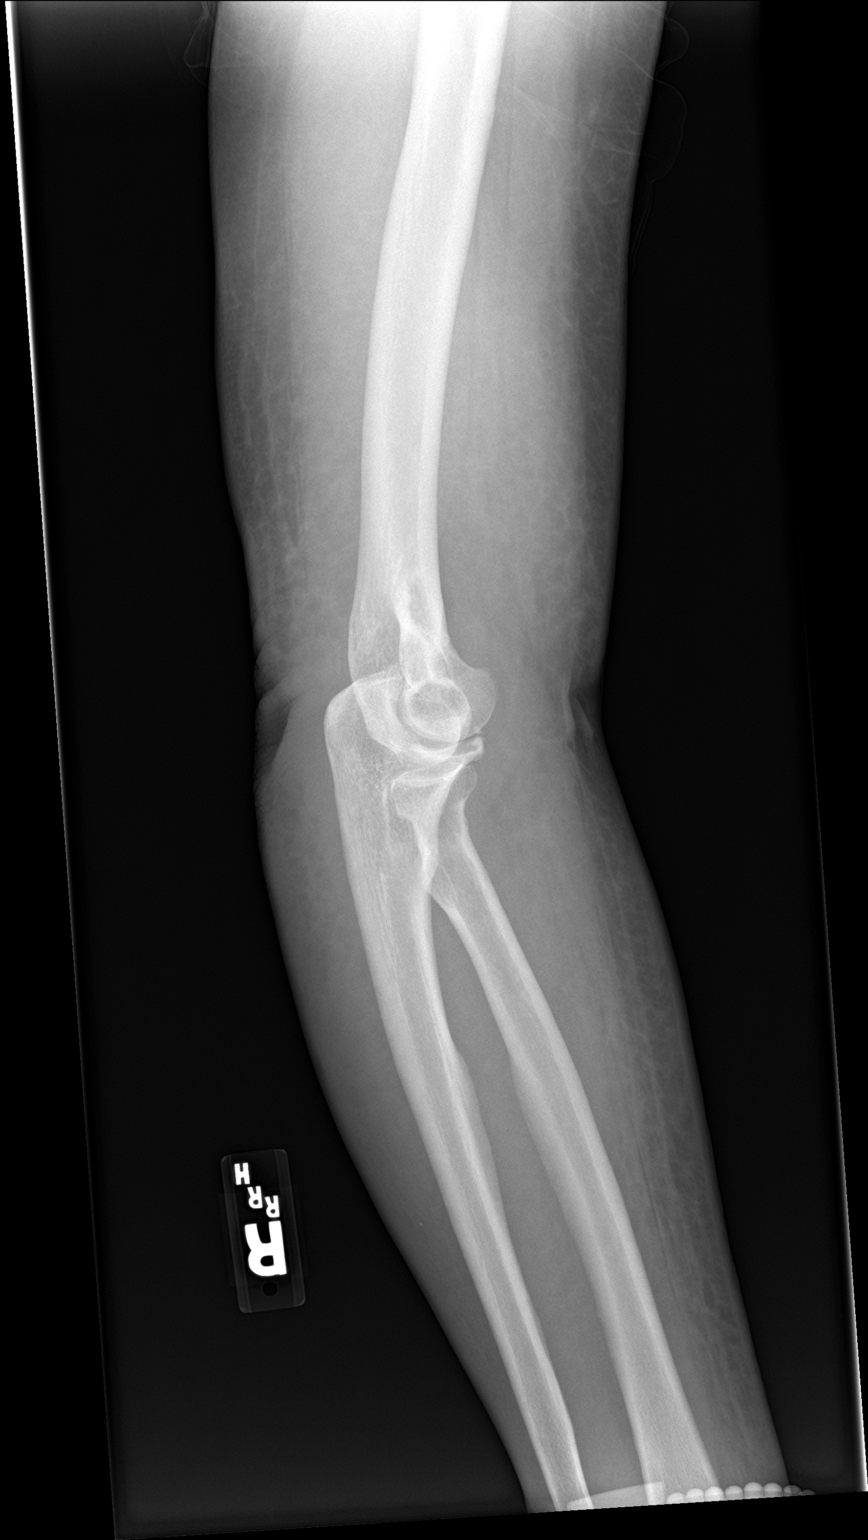

[elbow obl (2 of 2)]
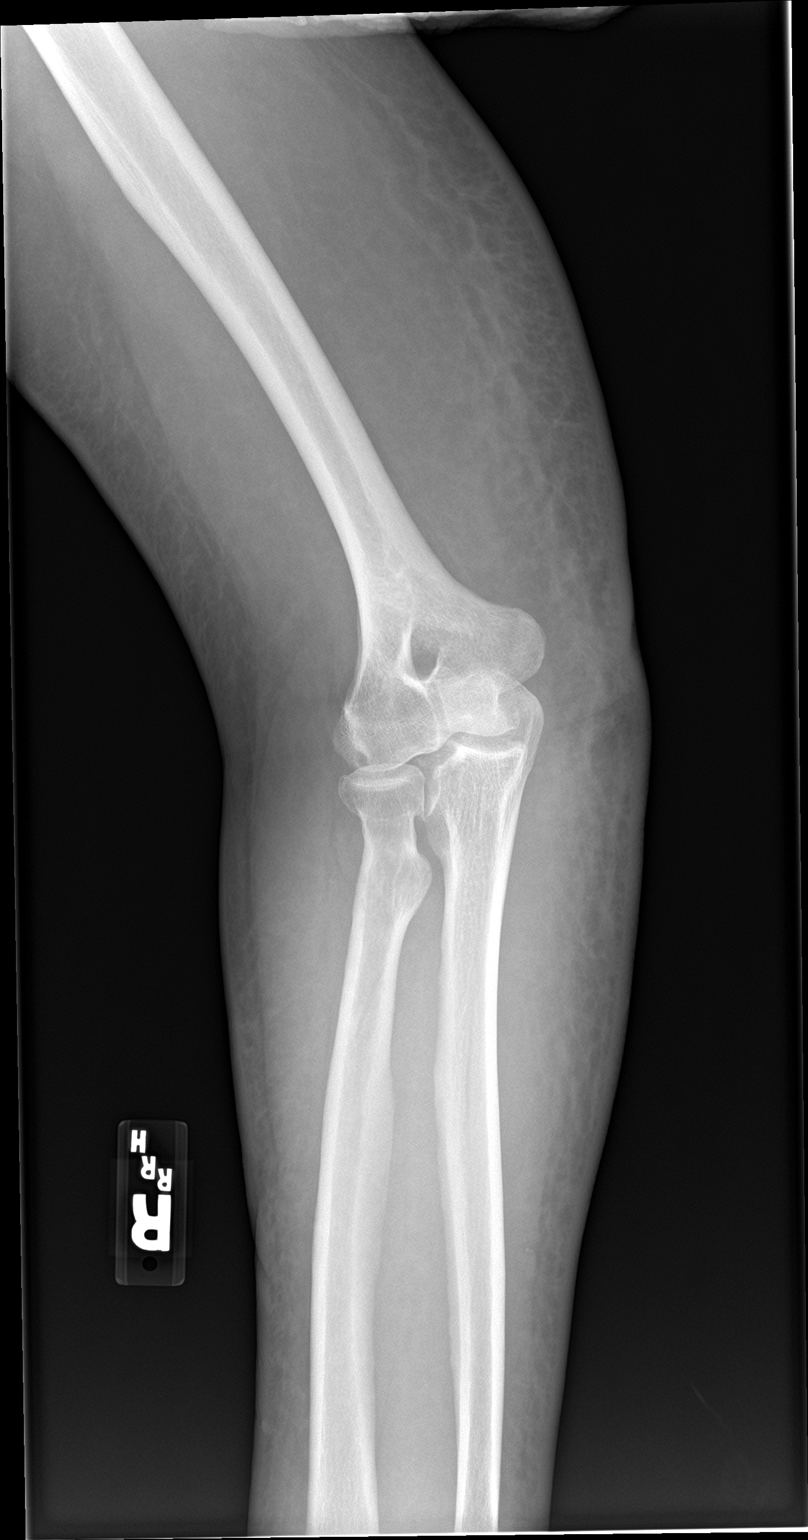

[elbow lat]
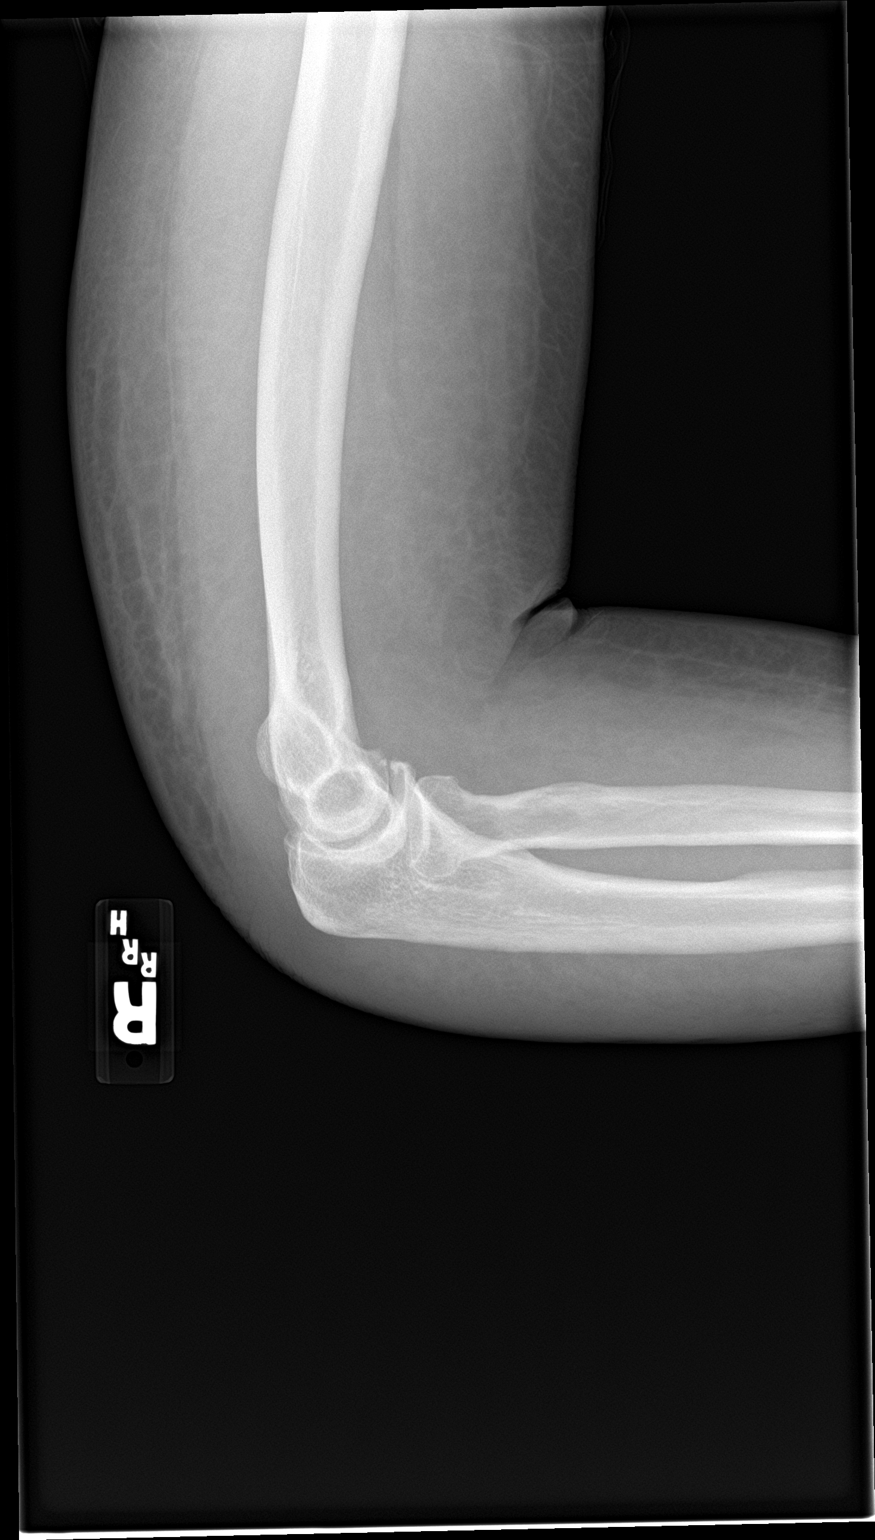

[4 of 4 positions shown; findings below may reference images not displayed]

FINDINGS: Five views of right elbow submitted. No acute fracture or
subluxation. There is mild soft tissue swelling dorsally distal
humeral region. No posterior fat pad sign. Mild spurring of radial
head.
IMPRESSION: No acute fracture or subluxation.  Mild spurring of radial head.
